# Patient Record
Sex: Female | Born: 1945 | Race: White | Hispanic: No | Marital: Married
Health system: Southern US, Community
[De-identification: ages and names within clinical notes are randomized; demographics above are authoritative.]

## PROBLEM LIST (undated history)

## (undated) DIAGNOSIS — I340 Nonrheumatic mitral (valve) insufficiency: Secondary | ICD-10-CM

## (undated) DIAGNOSIS — E785 Hyperlipidemia, unspecified: Secondary | ICD-10-CM

## (undated) DIAGNOSIS — Z8489 Family history of other specified conditions: Secondary | ICD-10-CM

## (undated) DIAGNOSIS — I499 Cardiac arrhythmia, unspecified: Secondary | ICD-10-CM

## (undated) DIAGNOSIS — K269 Duodenal ulcer, unspecified as acute or chronic, without hemorrhage or perforation: Secondary | ICD-10-CM

## (undated) DIAGNOSIS — M810 Age-related osteoporosis without current pathological fracture: Secondary | ICD-10-CM

## (undated) DIAGNOSIS — H9190 Unspecified hearing loss, unspecified ear: Secondary | ICD-10-CM

## (undated) DIAGNOSIS — F0390 Unspecified dementia without behavioral disturbance: Secondary | ICD-10-CM

## (undated) DIAGNOSIS — M1990 Unspecified osteoarthritis, unspecified site: Secondary | ICD-10-CM

## (undated) DIAGNOSIS — I10 Essential (primary) hypertension: Secondary | ICD-10-CM

## (undated) DIAGNOSIS — I517 Cardiomegaly: Secondary | ICD-10-CM

## (undated) DIAGNOSIS — R011 Cardiac murmur, unspecified: Secondary | ICD-10-CM

## (undated) DIAGNOSIS — R0602 Shortness of breath: Secondary | ICD-10-CM

## (undated) DIAGNOSIS — W19XXXA Unspecified fall, initial encounter: Secondary | ICD-10-CM

## (undated) DIAGNOSIS — E039 Hypothyroidism, unspecified: Secondary | ICD-10-CM

## (undated) DIAGNOSIS — J189 Pneumonia, unspecified organism: Secondary | ICD-10-CM

## (undated) HISTORY — PX: INNER EAR SURGERY: SHX679

## (undated) HISTORY — PX: CHOLECYSTECTOMY OPEN: SUR202

## (undated) HISTORY — DX: Cardiac arrhythmia, unspecified: I49.9

## (undated) HISTORY — PX: CATARACT EXTRACTION W/ INTRAOCULAR LENS  IMPLANT, BILATERAL: SHX1307

## (undated) HISTORY — PX: ABDOMINAL HYSTERECTOMY: SHX81

## (undated) HISTORY — PX: JOINT REPLACEMENT: SHX530

## (undated) HISTORY — PX: THYROIDECTOMY: SHX17

## (undated) HISTORY — DX: Age-related osteoporosis without current pathological fracture: M81.0

## (undated) HISTORY — PX: REPAIR OF PERFORATED ULCER: SHX6065

## (undated) HISTORY — DX: Essential (primary) hypertension: I10

## (undated) HISTORY — PX: TUBAL LIGATION: SHX77

## (undated) HISTORY — DX: Unspecified hearing loss, unspecified ear: H91.90

## (undated) HISTORY — DX: Hyperlipidemia, unspecified: E78.5

## (undated) HISTORY — PX: TONSILLECTOMY AND ADENOIDECTOMY: SUR1326

---
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
## 1999-04-14 ENCOUNTER — Encounter: Payer: Self-pay | Admitting: Internal Medicine

## 1999-04-14 ENCOUNTER — Ambulatory Visit (HOSPITAL_COMMUNITY): Admission: RE | Admit: 1999-04-14 | Discharge: 1999-04-14 | Payer: Self-pay | Admitting: Internal Medicine

## 1999-04-20 ENCOUNTER — Encounter: Payer: Self-pay | Admitting: Internal Medicine

## 1999-04-20 ENCOUNTER — Ambulatory Visit (HOSPITAL_COMMUNITY): Admission: RE | Admit: 1999-04-20 | Discharge: 1999-04-20 | Payer: Self-pay | Admitting: Internal Medicine

## 1999-04-26 ENCOUNTER — Encounter: Payer: Self-pay | Admitting: Neurology

## 1999-04-26 ENCOUNTER — Ambulatory Visit (HOSPITAL_COMMUNITY): Admission: RE | Admit: 1999-04-26 | Discharge: 1999-04-26 | Payer: Self-pay | Admitting: Neurology

## 1999-06-03 ENCOUNTER — Emergency Department (HOSPITAL_COMMUNITY): Admission: EM | Admit: 1999-06-03 | Discharge: 1999-06-03 | Payer: Self-pay | Admitting: Emergency Medicine

## 1999-06-29 ENCOUNTER — Other Ambulatory Visit: Admission: RE | Admit: 1999-06-29 | Discharge: 1999-06-29 | Payer: Self-pay | Admitting: Internal Medicine

## 1999-08-03 ENCOUNTER — Ambulatory Visit: Admission: RE | Admit: 1999-08-03 | Discharge: 1999-08-03 | Payer: Self-pay | Admitting: Pulmonary Disease

## 1999-09-24 ENCOUNTER — Other Ambulatory Visit: Admission: RE | Admit: 1999-09-24 | Discharge: 1999-09-24 | Payer: Self-pay | Admitting: *Deleted

## 2001-03-09 ENCOUNTER — Other Ambulatory Visit: Admission: RE | Admit: 2001-03-09 | Discharge: 2001-03-09 | Payer: Self-pay | Admitting: Obstetrics and Gynecology

## 2002-03-26 ENCOUNTER — Other Ambulatory Visit: Admission: RE | Admit: 2002-03-26 | Discharge: 2002-03-26 | Payer: Self-pay | Admitting: Obstetrics and Gynecology

## 2003-01-23 ENCOUNTER — Encounter: Admission: RE | Admit: 2003-01-23 | Discharge: 2003-01-23 | Payer: Self-pay | Admitting: Family Medicine

## 2003-01-23 ENCOUNTER — Encounter: Payer: Self-pay | Admitting: Family Medicine

## 2003-11-04 ENCOUNTER — Other Ambulatory Visit: Admission: RE | Admit: 2003-11-04 | Discharge: 2003-11-04 | Payer: Self-pay | Admitting: Obstetrics and Gynecology

## 2004-05-26 ENCOUNTER — Ambulatory Visit: Payer: Self-pay | Admitting: Pulmonary Disease

## 2004-05-31 ENCOUNTER — Ambulatory Visit: Payer: Self-pay | Admitting: Pulmonary Disease

## 2004-09-06 ENCOUNTER — Ambulatory Visit (HOSPITAL_COMMUNITY): Admission: RE | Admit: 2004-09-06 | Discharge: 2004-09-06 | Payer: Self-pay | Admitting: Gastroenterology

## 2004-09-06 ENCOUNTER — Encounter (INDEPENDENT_AMBULATORY_CARE_PROVIDER_SITE_OTHER): Payer: Self-pay | Admitting: Specialist

## 2005-02-04 ENCOUNTER — Ambulatory Visit: Payer: Self-pay | Admitting: Emergency Medicine

## 2005-03-29 ENCOUNTER — Other Ambulatory Visit: Admission: RE | Admit: 2005-03-29 | Discharge: 2005-03-29 | Payer: Self-pay | Admitting: Obstetrics and Gynecology

## 2005-06-14 ENCOUNTER — Emergency Department (HOSPITAL_COMMUNITY): Admission: EM | Admit: 2005-06-14 | Discharge: 2005-06-14 | Payer: Self-pay | Admitting: Family Medicine

## 2005-06-27 ENCOUNTER — Encounter: Admission: RE | Admit: 2005-06-27 | Discharge: 2005-06-27 | Payer: Self-pay | Admitting: Pediatrics

## 2005-10-11 ENCOUNTER — Emergency Department (HOSPITAL_COMMUNITY): Admission: EM | Admit: 2005-10-11 | Discharge: 2005-10-11 | Payer: Self-pay | Admitting: Emergency Medicine

## 2005-10-11 ENCOUNTER — Encounter: Payer: Self-pay | Admitting: Pulmonary Disease

## 2005-11-10 ENCOUNTER — Encounter: Admission: RE | Admit: 2005-11-10 | Discharge: 2005-11-10 | Payer: Self-pay | Admitting: Internal Medicine

## 2007-01-23 ENCOUNTER — Ambulatory Visit (HOSPITAL_BASED_OUTPATIENT_CLINIC_OR_DEPARTMENT_OTHER): Admission: RE | Admit: 2007-01-23 | Discharge: 2007-01-23 | Payer: Self-pay | Admitting: Orthopedic Surgery

## 2008-03-07 ENCOUNTER — Encounter: Admission: RE | Admit: 2008-03-07 | Discharge: 2008-03-07 | Payer: Self-pay | Admitting: Family Medicine

## 2008-05-09 ENCOUNTER — Ambulatory Visit: Payer: Self-pay | Admitting: Pulmonary Disease

## 2008-05-09 DIAGNOSIS — I10 Essential (primary) hypertension: Secondary | ICD-10-CM | POA: Insufficient documentation

## 2008-05-09 DIAGNOSIS — J309 Allergic rhinitis, unspecified: Secondary | ICD-10-CM | POA: Insufficient documentation

## 2008-05-09 DIAGNOSIS — J209 Acute bronchitis, unspecified: Secondary | ICD-10-CM

## 2008-05-09 DIAGNOSIS — J45909 Unspecified asthma, uncomplicated: Secondary | ICD-10-CM

## 2008-05-27 ENCOUNTER — Encounter: Admission: RE | Admit: 2008-05-27 | Discharge: 2008-05-27 | Payer: Self-pay | Admitting: Family Medicine

## 2008-06-10 ENCOUNTER — Ambulatory Visit: Payer: Self-pay | Admitting: Pulmonary Disease

## 2008-06-10 DIAGNOSIS — M818 Other osteoporosis without current pathological fracture: Secondary | ICD-10-CM

## 2008-07-14 ENCOUNTER — Ambulatory Visit: Payer: Self-pay | Admitting: Critical Care Medicine

## 2008-07-14 ENCOUNTER — Telehealth (INDEPENDENT_AMBULATORY_CARE_PROVIDER_SITE_OTHER): Payer: Self-pay | Admitting: *Deleted

## 2008-08-18 ENCOUNTER — Ambulatory Visit: Payer: Self-pay | Admitting: Internal Medicine

## 2008-10-23 ENCOUNTER — Ambulatory Visit: Payer: Self-pay | Admitting: Pulmonary Disease

## 2009-03-04 ENCOUNTER — Ambulatory Visit: Payer: Self-pay | Admitting: Pulmonary Disease

## 2009-03-06 DIAGNOSIS — B370 Candidal stomatitis: Secondary | ICD-10-CM

## 2009-03-20 ENCOUNTER — Telehealth (INDEPENDENT_AMBULATORY_CARE_PROVIDER_SITE_OTHER): Payer: Self-pay | Admitting: *Deleted

## 2009-05-07 ENCOUNTER — Encounter: Admission: RE | Admit: 2009-05-07 | Discharge: 2009-05-07 | Payer: Self-pay | Admitting: Family Medicine

## 2009-05-22 ENCOUNTER — Encounter: Admission: RE | Admit: 2009-05-22 | Discharge: 2009-05-22 | Payer: Self-pay | Admitting: Family Medicine

## 2009-05-22 DIAGNOSIS — I059 Rheumatic mitral valve disease, unspecified: Secondary | ICD-10-CM

## 2009-05-28 ENCOUNTER — Encounter: Admission: RE | Admit: 2009-05-28 | Discharge: 2009-05-28 | Payer: Self-pay | Admitting: Family Medicine

## 2009-08-17 DIAGNOSIS — H9190 Unspecified hearing loss, unspecified ear: Secondary | ICD-10-CM

## 2009-10-05 ENCOUNTER — Ambulatory Visit: Payer: Self-pay | Admitting: Pulmonary Disease

## 2010-04-21 DIAGNOSIS — F309 Manic episode, unspecified: Secondary | ICD-10-CM | POA: Insufficient documentation

## 2010-07-11 ENCOUNTER — Encounter: Payer: Self-pay | Admitting: Family Medicine

## 2010-07-22 NOTE — Assessment & Plan Note (Signed)
Summary: Acute NP office visit - asthma   Copy to:  self Primary Provider/Referring Provider:  Dr. Maryelizabeth Rowan  CC:  asthma flare with increased SOB, wheezing, and some coughing.  History of Present Illness: 65 year old  with  persistent asthma  She describes asthma since puberty, last prednisone use 5 -6 yrs ago, prednisone use in the past was complicated by duodenal ulcer perforation.  advair 500/50 has worked well, CXR 4/07 showed hyperinlation  05/09/08 >> Rxed for bronchitis with zpak . I do note the ACE inhibitor on her medication list - we will monitor her for upper airway stability. 12/09>>spirometry on advair 500/50 >> moderate reversible airway obstruction c/w inadequately tretaed asthma, singulair added 1/10 exacerbation requiring steroids 5/10 >> Rare use of rescue inhaler. Activity tolerance at baseline- does not exercise. Concerns about osteoporosis with high dose inhaled steroids, Occasional cough  March 06, 2009--Presents for 4 month follow up, states the decrease in her advair "isn't quite cutting it" and still feels SOB with exertion.  also c/o PND. Had been doing very well, until last 1-2 weeks, now more allergies, w/ nasal driange, throat tickle, and tightness. Denies chest pain,orthopnea, hemoptysis, fever, n/v/d, edema, headache.   October 05, 2009--Presents for an acute office visit. Complains of asthma flare with increased SOB, wheezing over last week. Worse over the last 2 days w/ bad weather w/ rain. Denies chest pain, dyspnea, orthopnea, hemoptysis, fever, n/v/d, headache. Under alot of stress lately. She is ACE but does not have cough (no psuedowheezing on exam). Using Advair w/ no missed doses. Has been doing well with asthma until recently x last 2 weeks. Does use ProAir mainly w/ exericses. Has used primetime mist when she was out of time. Denies chest pain,  orthopnea, hemoptysis, fever, n/v/d, edema, headache.   Medications Prior to Update: 1)   Levothroid 88 Mcg Tabs (Levothyroxine Sodium) .... Once Daily 2)  Amlodipine Besylate 5 Mg Tabs (Amlodipine Besylate) .... Once Daily 3)  Simvastatin 20 Mg Tabs (Simvastatin) .... At Bedtime 4)  Lisinopril-Hydrochlorothiazide 10-12.5 Mg Tabs (Lisinopril-Hydrochlorothiazide) .... Once Daily 5)  Travatan 0.004 % Soln (Travoprost) .... Once Daily 6)  [facility] Acetaminophen 325 Mg Tabs (Acetaminophen) .... As Needed 7)  Imodium Advanced 2-125 Mg Chew (Loperamide-Simethicone) .... Once Daily 8)  Proair Hfa 108 (90 Base) Mcg/act Aers (Albuterol Sulfate) .... 2 Puffs Every 4 Hours As Needed 9)  Boniva 150 Mg Tabs (Ibandronate Sodium) .[name].. 1 Per Month 10)  Vitamin D 16109 Unit Caps (Ergocalciferol) .... Once A Week 11)  Advair Diskus 500-50 Mcg/dose Aepb (Fluticasone-Salmeterol) .[name].. 1 Puff Two Times A Day Rinse Mouth Well 12)  Tylenol 325 Mg Tabs (Acetaminophen) .... Per Bottle 13)  Fluconazole 100 Mg Tabs (Fluconazole) .... 2 By Mouth Once Daily , 1 By Mouth Once Daily Till Gone  Current Medications (verified): 1)  Levothroid 88 Mcg Tabs (Levothyroxine Sodium) .... Once Daily 2)  Amlodipine Besylate 5 Mg Tabs (Amlodipine Besylate) .... Once Daily 3)  Simvastatin 20 Mg Tabs (Simvastatin) .... At Bedtime 4)  Lisinopril-Hydrochlorothiazide 10-12.5 Mg Tabs (Lisinopril-Hydrochlorothiazide) .... Once Daily 5)  Travatan 0.004 % Soln (Travoprost) .... Once Daily 6)  Imodium Advanced 2-125 Mg Chew (Loperamide-Simethicone) .... Once Daily 7)  Proair Hfa 108 (90 Base) Mcg/act Aers (Albuterol Sulfate) .... 2 Puffs Every 4 Hours As Needed 8)  Boniva 150 Mg Tabs (Ibandronate Sodium) .[name].. 1 Per Month 9)  Vitamin D 60454 Unit Caps (Ergocalciferol) .... Once A Week 10)  Advair Diskus 500-50 Mcg/dose Aepb (Fluticasone-Salmeterol) .Marland KitchenMarland KitchenMarland Kitchen  1 Puff Two Times A Day Rinse Mouth Well 11)  Tylenol 325 Mg Tabs (Acetaminophen) .... Per Bottle  Allergies: 1)  ! Aspirin 2)  ! 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  Family History: Last updated: 05/09/2008 allergies-twin sister  heart disease-mother blood clots-mother cancer-mother  Social History: Last updated: 05/09/2008 married and lives with husband daycare 2 dogs  Risk Factors: Smoking Status: never (05/09/2008)  Review of Systems      See HPI  Vital Signs:  Patient profile:   65 year old female Height:      61 inches Weight:      108.13 pounds BMI:     20.50 O2 Sat:      99 % on Room air Temp:     97.0 degrees F oral Pulse rate:   88 / minute BP sitting:   118 / 66  (left arm) Cuff size:   regular  Vitals Entered By: Boone Master CNA (October 05, 2009 11:07 AM)  O2 Flow:  Room air CC: asthma flare with increased SOB, wheezing, some coughing Is Patient Diabetic? No Comments Medications reviewed with patient Daytime contact number verified with patient. Boone Master CNA  October 05, 2009 11:07 AM    Physical Exam  Additional Exam:  Gen. Pleasant, well-nourished, in no distress ENT - no lesions, pale nasal mucosa, post pharynx w/ scattered white patches c/w thrush.  Neck: No JVD, no thyromegaly, no carotid bruits Lungs: no use of accessory muscles,  faint exp wheeizng, no psuedowheezing.  Cardiovascular: Rhythm regular, heart sounds  normal, no murmurs or gallops, no peripheral edema Musculoskeletal: No deformities, no cyanosis or clubbing      Impression & Recommendations:  Problem # 1:  ASTHMA (ICD-493.90) Flare  REC:  Steroid taper.  if she has recurrent flares would change her ACE -probalbly not a good idea with her asthma. Will send copy note to PCP Continue on Advair follow up Dr. Vassie Loll in  69month.   Medications Added to Medication  List This Visit: 1)  Prednisone 10 Mg Tabs (Prednisone) .... 4 tabs for 2 days, then 3 tabs for 2 days, 2 tabs for 2 days, then 1 tab for 2 days, then stop  Complete Medication List: 1)  Levothroid 88 Mcg Tabs (Levothyroxine sodium) .... Once daily 2)  Amlodipine Besylate 5 Mg Tabs (Amlodipine besylate) .... Once daily 3)  Simvastatin 20 Mg Tabs (Simvastatin) .... At bedtime 4)  Lisinopril-hydrochlorothiazide 10-12.5 Mg Tabs (Lisinopril-hydrochlorothiazide) .... Once daily 5)  Travatan 0.004 % Soln (Travoprost) .... Once daily 6)  Imodium Advanced 2-125 Mg Chew (Loperamide-simethicone) .... Once daily 7)  Proair Hfa 108 (90 Base) Mcg/act Aers (Albuterol sulfate) .... 2 puffs every 4 hours as needed 8)  Boniva 150 Mg Tabs (Ibandronate sodium) .[name].. 1 per month 9)  Vitamin D 45409 Unit Caps (Ergocalciferol) .... Once a week 10)  Advair Diskus 500-50 Mcg/dose Aepb (Fluticasone-salmeterol) .[name].. 1 puff two times a day rinse mouth well 11)  Tylenol 325 Mg Tabs (Acetaminophen) .... Per bottle 12)  Prednisone 10 Mg Tabs (Prednisone) .... 4 tabs for 2 days, then 3 tabs for 2 days, 2 tabs for 2 days, then 1 tab for 2 days, then stop  Other Orders: Est. Patient Level IV (81191)  Patient Instructions: 1)  Steroid taper over next week.  2)  Continue on Advair 1 puff two times a day , brush/rinse/gargle 3)  Stop Primatene Mist.  4)  May use ProAir for rescue use.  5)  Please contact office for sooner follow up if symptoms do not improve or worsen  6)  follow up Dr. Vassie Loll in 4 weeks.  Prescriptions: PREDNISONE 10 MG TABS (PREDNISONE) 4 tabs for 2 days, then 3 tabs for 2 days, 2 tabs for 2 days, then 1 tab for 2 days, then stop  #20 x 0   Entered and Authorized by:   Rubye Oaks NP   Signed by:   Rubye Oaks NP on 10/05/2009   Method used:   Electronically to        [facility]. [phone]* (retail)       [address]       [place], [place]  [address]       Ph:  [phone]       Fax: [phone]   RxID:   [id number]   Appended Document: neb tx documenation    Clinical Lists Changes  Orders: Added new Service order of Nebulizer Tx (44010) - Signed

## 2010-09-01 DIAGNOSIS — E559 Vitamin D deficiency, unspecified: Secondary | ICD-10-CM | POA: Insufficient documentation

## 2010-09-01 DIAGNOSIS — R7309 Other abnormal glucose: Secondary | ICD-10-CM | POA: Insufficient documentation

## 2010-09-01 DIAGNOSIS — M810 Age-related osteoporosis without current pathological fracture: Secondary | ICD-10-CM | POA: Insufficient documentation

## 2010-09-01 DIAGNOSIS — E785 Hyperlipidemia, unspecified: Secondary | ICD-10-CM | POA: Insufficient documentation

## 2010-09-01 DIAGNOSIS — I10 Essential (primary) hypertension: Secondary | ICD-10-CM | POA: Insufficient documentation

## 2010-10-28 DIAGNOSIS — K089 Disorder of teeth and supporting structures, unspecified: Secondary | ICD-10-CM | POA: Insufficient documentation

## 2010-11-02 NOTE — Op Note (Signed)
Cheryl [name], Cheryl [name]                ACCOUNT NO.:  [id number]   MEDICAL RECORD NO.:  [id number]          PATIENT TYPE:  AMB   LOCATION:  DSC                          FACILITY:  [facility]   PHYSICIAN:  Cheryl Fitch. [name], M.D. DATE OF BIRTH:  01-Oct-1945   DATE OF PROCEDURE:  01/23/2007  DATE OF DISCHARGE:                               OPERATIVE REPORT   PREOPERATIVE DIAGNOSES:  1. 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  2. Chronic stenosing tenosynovitis of left ring finger at A1 pulley      with flexion fractures of PIP and DIP joints.   POSTOPERATIVE DIAGNOSES:  1. 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  2. Chronic stenosing tenosynovitis of left ring finger at A1 pulley      with flexion fractures of PIP and DIP joints.   OPERATION:  1. Release of right long finger A1 pulley with limited tenosynovectomy      and removal of fibrotic tenosynovial fold.  2. Release of left ring finger A1 pulley with limited tenosynovectomy      and removal of fibrotic tenosynovium.   OPERATIONS:  Cheryl Igo, MD.   ASSISTANT:  Cheryl Rusk, PA-C.   ANESTHESIA:  General sedation/2% lidocaine metacarpal head level block  of right long finger and left ring finger.   SUPERVISING ANESTHESIOLOGIST:  Dr. Sampson Goon.   INDICATIONS:  Cheryl [name] is a 65 year old woman referred through the  courtesy of Dr. Garner Nash of [facility].   She has a history of bilateral stenosing tenosynovitis with significant  flexion contractures of the right long and left ring fingers.   She was seen in consultation and advised to proceed with release of the  A1 pulleys.   Her flexion contractures were due to marked fibrosis of the tenosynovium  proximal to the A1 pulleys.   After informed consent, she is brought to the operating room at  this  time.   DESCRIPTION OF PROCEDURE:  Tiaja Hagan is brought to the operating room  and placed in supine position on the operating table.   Following light sedation, the right and left arms were prepped with  Betadine soap solution and sterilely draped.  2% lidocaine was  infiltrated into the path of the intended incision for the right long A1  pulley release including the flexor sheath and the left ring finger, A1  pulley release including the flexor sheath.   The procedure commenced with exsanguination of the left arm with an  Esmarch bandage and inflation of arterial tourniquet on the proximal  brachium to 220 mmHg.  A short oblique incision was fashioned directly  over the left ring finger A1 pulley.  The subcutaneous tissue was  carefully divided taking care to identify and release the pretendinous  bands of the palmar fascia of the left ring finger.   The subcutaneous tissue was carefully divided taking care to identify  the A1 pulley.  There  was significant fibrosis of the tenosynovial fold  proximal to the A1 pulley.   The A1 pulley was released along its radial border.  The tendons were  delivered.  The superficialis and profundus tendons were adherent due to  the presence of marked fibrotic tenosynovium at the proximal fold.   This was meticulously dissected off with tenotomy scissors and a fine  rongeur.  Thereafter full range of motion of the left ring finger was  recovered with passive range of motion examination.   The wound was then repaired with multiple mattress sutures of 5-0 nylon.   The wound was dressed with Xeroflo sterile gauze and an Ace wrap.  The  tourniquet was released with immediate capillary refill to all fingers  and the thumb.   Attention then directed to the right long finger.   An oblique incision was fashioned in the distal palmar crease directly  over the palpably thickened A1 pulley.  The subcutaneous tissue was  carefully divided  taking care to identify and gently release the  pretendinous fibers of the palmar fascia.  The A1 pulley was isolated  and split along its radial border with scalpel and scissors.  The flexor  tendons were delivered and once again a fibrotic fold of tenosynovium  was dissected off the tendons proximal to the A1 pulley.  This allowed  recovery of full active range of motion as well as passive extension of  the right long finger.   The wound was infected at the bleeding points followed by repair of the  skin with mattress suture of 5-0 nylon.   The wound was then dressed with Xeroflo sterile gauze and an Ace wrap.  The tourniquet was released with immediate capillary refill.  There no  apparent complications.   Cheryl [name] tolerated the surgery and anesthesia well.  She is  transferred to the recovery room with stable vital signs.      Cheryl [name], M.D.  Electronically Signed     RVS/MEDQ  D:  01/23/2007  T:  01/23/2007  Job:  [id number]

## 2010-11-05 NOTE — Op Note (Signed)
NAME:  Cheryl [name], Cheryl [name]                ACCOUNT NO.:  [id number]   MEDICAL RECORD NO.:  [id number]          PATIENT TYPE:  AMB   LOCATION:  ENDO                         FACILITY:  [facility]   PHYSICIAN:  Bernette Redbird, M.D.   DATE OF BIRTH:  05-09-1946   DATE OF PROCEDURE:  09/06/2004  DATE OF DISCHARGE:                                 OPERATIVE REPORT   PROCEDURE:  Flexible sigmoidoscopy with biopsies.   ENDOSCOPIST:  Bernette Redbird, M.D.   INDICATIONS FOR PROCEDURE:  Screening for colon cancer in a 65 year old  female.   FINDINGS:  Two diminutive polyps, removed.   PROCEDURE:  The nature, purpose and risks of the procedure were familiar to  the patient who provided a written consent. Sedation for this procedure in  the upper endoscopy which preceded it totalled fentanyl 100 mcg and Versed 9  milligrams IV, without arrhythmias or desaturation. The Olympus pediatric  video colonoscope was advanced to about 40 cm, whereupon the prep became  suboptimal despite irrigation with water, so pullback was performed. There  was also some looping on attempts at further advancement.   Up to the limit of the exam, the quality of prep was very good, and it is  felt that those areas were well seen.   There were two small sessile polyps near the rectosigmoid junction at about  20 cm, removed by cold biopsy technique. One of them may have had some  slight residual tissue versus adjacent mucosal edema from the biopsy  procedure.   No large polyps were seen and there was no evidence of cancer, colitis,  vascular malformations or diverticulosis. Retroflexion was unremarkable. The  patient tolerated the procedure well and there no apparent complications.   IMPRESSION:  Small colon polyps, removed as described above.   PLAN:  Await pathology results.   Signed Robert B to the defects      RB/MEDQ  D:  09/06/2004  T:  09/06/2004  Job:  [id number]   cc:   Soyla Murphy. Renne Crigler, M.D.  [address]  [address]  [place]  [place] [address]  Fax: [phone]

## 2010-11-05 NOTE — Op Note (Signed)
NAME:  CALENE, PARADISO                ACCOUNT NO.:  [id number]   MEDICAL RECORD NO.:  [id number]          PATIENT TYPE:  AMB   LOCATION:  ENDO                         FACILITY:  [facility]   PHYSICIAN:  Bernette Redbird, M.D.   DATE OF BIRTH:  Jul 17, 1945   DATE OF PROCEDURE:  09/06/2004  DATE OF DISCHARGE:                                 OPERATIVE REPORT   PROCEDURE:  Upper endoscopy Savary dilatation of the esophagus under  fluoroscopy.   INDICATION:  Intermittent dysphagia symptoms in a 65 year old female.   FINDINGS:  Normal exam. Empiric dilatation to 18 mm.   PROCEDURE:  The nature, purpose, risks of the procedure have been discussed  with the patient who provided written consent. Sedation was fentanyl 100 mcg  and Versed 7 mg IV without arrhythmias or desaturation. The Olympus small-  caliber adult video endoscope was passed under direct vision. The vocal  cords looked normal and the esophagus was readily entered.   The esophagus was normal and specifically without evidence of stricture or  any ring or narrowing. No reflux esophagitis, Barrett's esophagus, varices,  infection or neoplasia were seen. The stomach contained a small bile-stained  residual, perhaps a little bit of retained food from some cereal which she  had the and this morning. This was suctioned up. The gastric mucosa was free  of any abnormalities such as gastritis, erosions, ulcers, polyps or masses  and a retroflexed view of the cardia was unremarkable. The pylorus, duodenal  bulb and second duodenum also looked normal.   It was decided to proceed with empiric dilatation based on the patient's  symptoms. The spring tip guidewire was passed into the antrum of the stomach  and the scope was removed in an exchange fashion, leaving the guidewire in  place, after which an 18 mm Savary dilator was passed under fluoroscopic  guidance, noting the wire to be in proper position. There was resistance  getting through the  hypopharyngeal region but then smooth resistance  remainder of way. The dilator and the guidewire were removed and the patient  was re-endoscoped under direct vision which showed a tiny hemorrhagic  abrasion of the soft palate without any obvious perforation. The esophagus  itself was free of any evident disruption, for example any fracture of her  ring or any other abnormality and the stomach was  without evidence of undue trauma. The scope was removed from the patient.  She tolerated the procedure well and there no apparent complications.   PLAN:  Clinical follow-up of dysphagia symptoms.      RB/MEDQ  D:  09/06/2004  T:  09/06/2004  Job:  [id number]   cc:   Soyla Murphy. Renne Crigler, M.D.  [address]  [place]  [place] [address]  Fax: [phone]

## 2011-02-06 ENCOUNTER — Emergency Department (HOSPITAL_COMMUNITY)
Admission: EM | Admit: 2011-02-06 | Discharge: 2011-02-07 | Disposition: A | Discharge status: Home / Self Care | Payer: BC Managed Care – PPO | Attending: Emergency Medicine | Admitting: Emergency Medicine

## 2011-02-06 ENCOUNTER — Emergency Department (HOSPITAL_COMMUNITY): Payer: BC Managed Care – PPO

## 2011-02-06 DIAGNOSIS — J449 Chronic obstructive pulmonary disease, unspecified: Secondary | ICD-10-CM | POA: Insufficient documentation

## 2011-02-06 DIAGNOSIS — E039 Hypothyroidism, unspecified: Secondary | ICD-10-CM | POA: Insufficient documentation

## 2011-02-06 DIAGNOSIS — R0602 Shortness of breath: Secondary | ICD-10-CM | POA: Insufficient documentation

## 2011-02-06 DIAGNOSIS — I059 Rheumatic mitral valve disease, unspecified: Secondary | ICD-10-CM | POA: Insufficient documentation

## 2011-02-06 DIAGNOSIS — R059 Cough, unspecified: Secondary | ICD-10-CM | POA: Insufficient documentation

## 2011-02-06 DIAGNOSIS — R05 Cough: Secondary | ICD-10-CM | POA: Insufficient documentation

## 2011-02-06 DIAGNOSIS — I10 Essential (primary) hypertension: Secondary | ICD-10-CM | POA: Insufficient documentation

## 2011-02-06 DIAGNOSIS — J4489 Other specified chronic obstructive pulmonary disease: Secondary | ICD-10-CM | POA: Insufficient documentation

## 2011-02-06 LAB — CBC
Hemoglobin: 13 g/dL (ref 12.0–15.0)
MCHC: 32.7 g/dL (ref 30.0–36.0)
Platelets: 328 10*3/uL (ref 150–400)
RBC: 4.35 MIL/uL (ref 3.87–5.11)
RDW: 13.4 % (ref 11.5–15.5)
WBC: 10.2 10*3/uL (ref 4.0–10.5)

## 2011-02-06 LAB — COMPREHENSIVE METABOLIC PANEL
AST: 26 U/L (ref 0–37)
Alkaline Phosphatase: 117 U/L (ref 39–117)
BUN: 16 mg/dL (ref 6–23)
Chloride: 104 mEq/L (ref 96–112)
GFR calc Af Amer: >60 mL/min (ref >60)
GFR calc non Af Amer: >60 mL/min (ref >60)
Glucose, Bld: 117 mg/dL — ABNORMAL HIGH (ref 70–99)
Sodium: 139 mEq/L (ref 135–145)

## 2011-02-06 LAB — DIFFERENTIAL
Basophils Absolute: 0 10*3/uL (ref 0.0–0.1)
Eosinophils Absolute: 0.8 10*3/uL — ABNORMAL HIGH (ref 0.0–0.7)
Eosinophils Relative: 8 % — ABNORMAL HIGH (ref 0–5)
Lymphocytes Relative: 33 % (ref 12–46)
Neutro Abs: 5.3 10*3/uL (ref 1.7–7.7)

## 2011-02-06 LAB — PRO B NATRIURETIC PEPTIDE: Pro B Natriuretic peptide (BNP): 88.6 pg/mL (ref 0–125)

## 2011-03-04 ENCOUNTER — Other Ambulatory Visit: Payer: Self-pay | Admitting: Family Medicine

## 2011-03-04 DIAGNOSIS — Z1231 Encounter for screening mammogram for malignant neoplasm of breast: Secondary | ICD-10-CM

## 2011-03-04 DIAGNOSIS — M25551 Pain in right hip: Secondary | ICD-10-CM | POA: Insufficient documentation

## 2011-03-04 DIAGNOSIS — Q72899 Other reduction defects of unspecified lower limb: Secondary | ICD-10-CM | POA: Insufficient documentation

## 2011-03-04 DIAGNOSIS — I10 Essential (primary) hypertension: Secondary | ICD-10-CM | POA: Insufficient documentation

## 2011-03-04 DIAGNOSIS — I272 Pulmonary hypertension, unspecified: Secondary | ICD-10-CM | POA: Insufficient documentation

## 2011-03-04 DIAGNOSIS — E039 Hypothyroidism, unspecified: Secondary | ICD-10-CM | POA: Insufficient documentation

## 2011-03-04 DIAGNOSIS — I517 Cardiomegaly: Secondary | ICD-10-CM | POA: Insufficient documentation

## 2011-03-04 DIAGNOSIS — I341 Nonrheumatic mitral (valve) prolapse: Secondary | ICD-10-CM | POA: Insufficient documentation

## 2011-03-10 ENCOUNTER — Ambulatory Visit
Admission: RE | Admit: 2011-03-10 | Discharge: 2011-03-10 | Disposition: A | Discharge status: Home / Self Care | Payer: BC Managed Care – PPO | Source: Ambulatory Visit | Attending: Family Medicine | Admitting: Family Medicine

## 2011-03-10 DIAGNOSIS — Z1231 Encounter for screening mammogram for malignant neoplasm of breast: Secondary | ICD-10-CM

## 2011-03-10 DIAGNOSIS — R945 Abnormal results of liver function studies: Secondary | ICD-10-CM | POA: Insufficient documentation

## 2011-04-04 LAB — BASIC METABOLIC PANEL
GFR calc Af Amer: >60
GFR calc non Af Amer: >60
Glucose, Bld: 97

## 2011-08-19 ENCOUNTER — Telehealth: Payer: Self-pay | Admitting: Pulmonary Disease

## 2011-08-19 NOTE — Telephone Encounter (Signed)
Spoke with pt. She states would like appt with RA to "discuss some issues" regarding hospitalizations since her last visit with him in 2011. She states only wants to see RA. I advised that he has no openings until 09/30/11. She states would like to be seen at least this month, preferably after the 8th when she gets paid. RA is here on 08/26/11- is it okay to overbook??  Pt states that she is almost out of advair 5/500 and is requesting a sample of this, although she was questioning whether needs to stay on this or not, pt sounds very confused about everything.  Please advise, thanks! Allergies  Allergen Reactions  . Aspirin     REACTION: asthma flare  . Codeine     REACTION: ulcer

## 2011-08-26 NOTE — Telephone Encounter (Signed)
Called and spoke with pt and she is aware that she will need to see RA for med changes.  Pt voiced her understanding and wanted to come in on 3/25 at 3:30.  Will schedule this appt for the pt.

## 2011-08-26 NOTE — Telephone Encounter (Signed)
Ok to make OV- next available

## 2011-08-30 NOTE — Telephone Encounter (Signed)
Scheduled pt to see RA on 3/25 at 3:30 pt aware of appt--per Randell Loop

## 2011-09-09 ENCOUNTER — Encounter: Payer: Self-pay | Admitting: Pulmonary Disease

## 2011-09-12 ENCOUNTER — Ambulatory Visit (INDEPENDENT_AMBULATORY_CARE_PROVIDER_SITE_OTHER): Payer: BC Managed Care – PPO | Admitting: Pulmonary Disease

## 2011-09-12 ENCOUNTER — Encounter: Payer: Self-pay | Admitting: Pulmonary Disease

## 2011-09-12 VITALS — BP 150/72 | HR 55 | Temp 97.9°F | Ht 60.0 in | Wt 118.2 lb

## 2011-09-12 DIAGNOSIS — J45909 Unspecified asthma, uncomplicated: Secondary | ICD-10-CM

## 2011-09-12 MED ORDER — FLUTICASONE-SALMETEROL 250-50 MCG/DOSE IN AEPB: 1.0000 | INHALATION_SPRAY | Freq: Two times a day (BID) | RESPIRATORY_TRACT | Status: DC

## 2011-09-12 MED ORDER — ALBUTEROL SULFATE HFA 108 (90 BASE) MCG/ACT IN AERS: 2.0000 | INHALATION_SPRAY | Freq: Four times a day (QID) | RESPIRATORY_TRACT | Status: DC | PRN

## 2011-09-12 NOTE — Patient Instructions (Addendum)
OK to Decrease advair to 250/50 1 puff twice daily Rx for albuterol MDI 2 puffs q 6h as needed

## 2011-09-12 NOTE — Assessment & Plan Note (Signed)
Ok to decrease advair to 250/50 Will need post -BD testing to see if reversibility exists - she seems to be relatively asymptomatic Will provide her with rescue saba - she knows to call back if increased use. We discussed signs of a flare.

## 2011-09-12 NOTE — Progress Notes (Signed)
  Subjective:    Patient ID: GUENEVERE ROORDA, female    DOB: 07/28/1945, 66 y.o.   MRN: [id number]  HPI  Primary Provider : [facility] Spr[facility], Deatra James  66 year old with persistent asthma  She describes asthma since puberty, last prednisone use 5 -6 yrs ago, prednisone use in the past was complicated by duodenal ulcer perforation.  advair 500/50 has worked well, CXR 4/07 showed hyperinlation  05/09/08 >> Rxed for bronchitis with zpak .   12/09>>spirometry on advair 500/50 >> moderate reversible airway obstruction c/w inadequately tretaed asthma, singulair added  1/10 exacerbation requiring steroids   October 05, 2009--flare needing steroids  09/12/2011 FU after 2 yrs, changed PCP 2 ER visits , not needed prednisone  Rare use of rescue inhaler. Activity tolerance at baseline- does not exercise. Concerns about osteoporosis with high dose inhaled steroids, Occasional cough  Spirometry >> mod severe airway obstruction - fev1 51%  Denies chest pain, orthopnea, hemoptysis, fever, n/v/d, edema, headache.    Review of Systems Patient denies significant dyspnea,cough, hemoptysis,  chest pain, palpitations, pedal edema, orthopnea, paroxysmal nocturnal dyspnea, lightheadedness, nausea, vomiting, abdominal or  leg pains      Objective:   Physical Exam  Gen. Pleasant, thin woman, in no distress ENT - no lesions, no post nasal drip Neck: No JVD, no thyromegaly, no carotid bruits Lungs: no use of accessory muscles, no dullness to percussion, clear without rales or rhonchi  Cardiovascular: Rhythm regular, heart sounds  normal, no murmurs or gallops, no peripheral edema Musculoskeletal: No deformities, no cyanosis or clubbing        Assessment & Plan:

## 2011-10-26 ENCOUNTER — Encounter: Payer: Self-pay | Admitting: Adult Health

## 2011-10-26 ENCOUNTER — Ambulatory Visit (INDEPENDENT_AMBULATORY_CARE_PROVIDER_SITE_OTHER): Payer: BC Managed Care – PPO | Admitting: Adult Health

## 2011-10-26 VITALS — BP 160/70 | HR 99 | Temp 97.0°F | Ht 60.6 in | Wt 118.0 lb

## 2011-10-26 DIAGNOSIS — I10 Essential (primary) hypertension: Secondary | ICD-10-CM

## 2011-10-26 DIAGNOSIS — J45909 Unspecified asthma, uncomplicated: Secondary | ICD-10-CM

## 2011-10-26 NOTE — Assessment & Plan Note (Signed)
Poor control with ? AR trigger +/- Ace Inhibitor   Plan;  Increase Advair 500/50 1 puff Twice daily  Until sample is gone - brush/rinse and gargle after use.  Use Claritin 10 mg daily for one week, then as needed for drainage. Saline nasal rinses as needed. Discussed with primary care doctor regarding blood pressure and medication that may be causing your asthma to be worse. A medication of concern is a Lotrel, which has an ACE inhibitor - we will forward today's office note to your family doctor Followup with Dr. Vassie Loll, and 3 months and as needed

## 2011-10-26 NOTE — Progress Notes (Signed)
  Subjective:    Patient ID: Cheryl [name], female    DOB: 1945-11-01, 66 y.o.   MRN: [id number]  HPI Primary Provider : [facility], Deatra James  66 year old with persistent asthma -Never smoker.  She describes asthma since puberty, last prednisone use 5 -6 yrs ago, prednisone use in the past was complicated by duodenal ulcer perforation.  advair 500/50 has worked well, CXR 4/07 showed hyperinlation  05/09/08 >> Rxed for bronchitis with zpak .   12/09>>spirometry on advair 500/50 >> moderate reversible airway obstruction c/w inadequately tretaed asthma, singulair added  1/10 exacerbation requiring steroids   October 05, 2009--flare needing steroids  09/12/2011 FU after 2 yrs, changed PCP 2 ER visits , not needed prednisone  Rare use of rescue inhaler. Activity tolerance at baseline- does not exercise. Concerns about osteoporosis with high dose inhaled steroids, Occasional cough  Spirometry >> mod severe airway obstruction - fev1 51%  Denies chest pain, orthopnea, hemoptysis, fever, n/v/d, edema, headache.  >>decreased Advair 250  10/26/2011 Acute OV  Complains sore throat, increased SOB, hoarsenes for last 1-2 weeks.  Did well on Advair 250 until 1 week. Increased SABA use for last 1 week.  Of note she is on an ACE inhibitor.  Under some stress, husband has bladder cancer , mother just passed.  No sinus drainage. No overt reflux.  Blood pressure is elevated, ran out of b/p meds x 2 days      Review of Systems Constitutional:   No  weight loss, night sweats,  Fevers, chills,  +fatigue, or  lassitude.  HEENT:   No headaches,  Difficulty swallowing,  Tooth/dental problems, or  Sore throat,                No sneezing, itching, ear ache,  +nasal congestion, post nasal drip,   CV:  No chest pain,  Orthopnea, PND, swelling in lower extremities, anasarca, dizziness, palpitations, syncope.   GI  No heartburn, indigestion, abdominal pain, nausea, vomiting, diarrhea, change in bowel  habits, loss of appetite, bloody stools.   Resp:  No coughing up of blood. [name]  No chest wall deformity  Skin: no rash or lesions.  GU: no dysuria, change in color of urine, no urgency or frequency.  No flank pain, no hematuria   MS:  No joint pain or swelling.  No decreased range of motion.  No back pain.  Psych:  No change in mood or affect. No depression or anxiety.  No memory loss.         Objective:   Physical Exam  GEN: A/Ox3; pleasant , NAD, thin, anxious   HEENT:  [place]/AT,  EACs-clear, TMs-wnl, NOSE-clear, THROAT-clear, no lesions, no postnasal drip or exudate noted. , hoarse w/ dry cough   NECK:  Supple w/ fair ROM; no JVD; normal carotid impulses w/o bruits; no thyromegaly or nodules palpated; no lymphadenopathy.  RESP  Coarse BS w/  w/o, wheezes/ rales/ or rhonchi.no accessory muscle use, no dullness to percussion  CARD:  RRR, no m/r/g  , no peripheral edema, pulses intact, no cyanosis or clubbing.  GI:   Soft & nt; nml bowel sounds; no organomegaly or masses detected.  Musco: Warm bil, no deformities or joint swelling noted.   Neuro: alert, no focal deficits noted.    Skin: Warm, no lesions or rashes        Assessment & Plan:

## 2011-10-26 NOTE — Assessment & Plan Note (Signed)
Elevated today , advised to discuss with PCP

## 2011-10-26 NOTE — Patient Instructions (Signed)
Increase Advair 500/50 1 puff Twice daily  Until sample is gone - brush/rinse and gargle after use.  Use Claritin 10 mg daily for one week, then as needed for drainage. Saline nasal rinses as needed. Discussed with primary care doctor regarding blood pressure and medication that may be causing your asthma to be worse. A medication of concern is a Lotrel, which has an ACE inhibitor - we will forward today's office note to your family doctor Followup with Dr. Vassie Loll, and 3 months and as needed

## 2012-01-12 ENCOUNTER — Ambulatory Visit: Payer: BC Managed Care – PPO | Admitting: Adult Health

## 2012-01-20 ENCOUNTER — Telehealth: Payer: Self-pay | Admitting: Pulmonary Disease

## 2012-01-20 MED ORDER — ALBUTEROL SULFATE HFA 108 (90 BASE) MCG/ACT IN AERS: 2.0000 | INHALATION_SPRAY | Freq: Four times a day (QID) | RESPIRATORY_TRACT | Status: DC | PRN

## 2012-01-20 NOTE — Telephone Encounter (Signed)
i spoke with pt and is aware rx has been sent. She states she will make an appt once her daughter has her baby. Nothing further was needed

## 2012-05-11 ENCOUNTER — Ambulatory Visit (INDEPENDENT_AMBULATORY_CARE_PROVIDER_SITE_OTHER): Payer: BC Managed Care – PPO | Admitting: Family Medicine

## 2012-05-11 ENCOUNTER — Ambulatory Visit (INDEPENDENT_AMBULATORY_CARE_PROVIDER_SITE_OTHER): Payer: BC Managed Care – PPO

## 2012-05-11 VITALS — BP 138/80 | HR 86 | Temp 97.8°F | Resp 23 | Ht 60.6 in | Wt 105.0 lb

## 2012-05-11 DIAGNOSIS — J018 Other acute sinusitis: Secondary | ICD-10-CM

## 2012-05-11 DIAGNOSIS — R05 Cough: Secondary | ICD-10-CM

## 2012-05-11 DIAGNOSIS — R6889 Other general symptoms and signs: Secondary | ICD-10-CM

## 2012-05-11 DIAGNOSIS — R634 Abnormal weight loss: Secondary | ICD-10-CM

## 2012-05-11 DIAGNOSIS — R059 Cough, unspecified: Secondary | ICD-10-CM

## 2012-05-11 DIAGNOSIS — Z23 Encounter for immunization: Secondary | ICD-10-CM

## 2012-05-11 DIAGNOSIS — J449 Chronic obstructive pulmonary disease, unspecified: Secondary | ICD-10-CM

## 2012-05-11 DIAGNOSIS — J029 Acute pharyngitis, unspecified: Secondary | ICD-10-CM

## 2012-05-11 DIAGNOSIS — R0600 Dyspnea, unspecified: Secondary | ICD-10-CM

## 2012-05-11 DIAGNOSIS — R0609 Other forms of dyspnea: Secondary | ICD-10-CM

## 2012-05-11 DIAGNOSIS — R350 Frequency of micturition: Secondary | ICD-10-CM

## 2012-05-11 LAB — POCT CBC
Granulocyte percent: 62 %G (ref 37–80)
MCH, POC: 29.9 pg (ref 27–31.2)
MID (cbc): 0.8 (ref 0–0.9)
MPV: 7.9 fL (ref 0–99.8)
POC Granulocyte: 6.2 (ref 2–6.9)
POC MID %: 8.2 %M (ref 0–12)
Platelet Count, POC: 407 10*3/uL (ref 142–424)
RBC: 5.09 M/uL (ref 4.04–5.48)
WBC: 10 10*3/uL (ref 4.6–10.2)

## 2012-05-11 LAB — POCT UA - MICROSCOPIC ONLY
Casts, Ur, LPF, POC: NEGATIVE
Mucus, UA: NEGATIVE
Yeast, UA: NEGATIVE

## 2012-05-11 LAB — GLUCOSE, POCT (MANUAL RESULT ENTRY): POC Glucose: 101 mg/dl — AB (ref 70–99)

## 2012-05-11 LAB — POCT URINALYSIS DIPSTICK
Ketones, UA: NEGATIVE
Protein, UA: NEGATIVE
Urobilinogen, UA: 0.2
pH, UA: 5

## 2012-05-11 MED ORDER — ALBUTEROL SULFATE HFA 108 (90 BASE) MCG/ACT IN AERS: 2.0000 | INHALATION_SPRAY | Freq: Four times a day (QID) | RESPIRATORY_TRACT | Status: DC | PRN

## 2012-05-11 MED ORDER — FLUTICASONE-SALMETEROL 500-50 MCG/DOSE IN AEPB: 1.0000 | INHALATION_SPRAY | Freq: Two times a day (BID) | RESPIRATORY_TRACT | Status: DC

## 2012-05-11 NOTE — Progress Notes (Signed)
Subjective: 66 year old lady who is here with a number of them concerns. She has been having unexplained weight loss. She's not been trying to lose weight. Her weight has gone down from about 118 in the spring here to 105 now. She has not had any major GI complaints. She has had cold intolerance, just can't seem to get warm. She has been having a intermittent sore throat for about a month. She has had urinary frequency. No dysuria. She is had a chronic cough. She does have a history of COPD. She's been told she was borderline diabetic, but her sugars run well to her knowledge. Her bowels are okay. When she lifts her grandchild she gets short of breath. She has deafness wears hearing aids, and has a history of a partial cleft palate.  Ros: Heent.no acute changes sore throat. Resp: as above Cv.L shoulder occ pain. Gi: hx of steroids causing perf ulcer Ms weakness  Objective: Pleasant, alert, fully oriented lady in no does does intermittently have a hacking cough. Wears hearing aids on both sides. The nasal ala or a little asymmetrical from her cleft problems. Her throat has some headache mucus stuck in a fine layer on the back of the throat is a little spots. Neck was supple without significant nodes. Chest is clear to auscultation. Heart regular without murmurs gallops or arrhythmias. Abdomen soft without mass or tenderness. No CVA tenderness.  Smallnevus right forearm  Assessment: Weight loss Chronic cough History of COPD Urinary frequency Sore throat Cold intolerance History of partial cleft palate Loss of hearing  Plan: Chest x-ray Urinalysis, CBC, complete metabolic panel, glucose, hemoglobin A1c, and TSH  Plan to look at the partial results which are available today for [place] to do with her. .  Results for orders placed in visit on 05/11/12  POCT CBC      Component Value Range   WBC 10.0  4.6 - 10.2 K/uL   Lymph, poc 3.0  0.6 - 3.4   POC LYMPH PERCENT 29.8  10 - 50 %L   MID (cbc)  0.8  0 - 0.9   POC MID % 8.2  0 - 12 %M   POC Granulocyte 6.2  2 - 6.9   Granulocyte percent 62.0  37 - 80 %G   RBC 5.09  4.04 - 5.48 M/uL   Hemoglobin 15.2  12.2 - 16.2 g/dL   HCT, POC 29.5 (*) 62.1 - 47.9 %   MCV 95.6  80 - 97 fL   MCH, POC 29.9  27 - 31.2 pg   MCHC 31.2 (*) 31.8 - 35.4 g/dL   RDW, POC 30.8     Platelet Count, POC 407  142 - 424 K/uL   MPV 7.9  0 - 99.8 fL  GLUCOSE, POCT (MANUAL RESULT ENTRY)      Component Value Range   POC Glucose 101 (*) 70 - 99 mg/dl  POCT GLYCOSYLATED HEMOGLOBIN (HGB A1C)      Component Value Range   Hemoglobin A1C 5.7    POCT URINALYSIS DIPSTICK      Component Value Range   Color, UA yellow     Clarity, UA clear     Glucose, UA neg     Bilirubin, UA neg     Ketones, UA neg     Spec Grav, UA 1.020     Blood, UA neg     pH, UA 5.0     Protein, UA neg     Urobilinogen, UA 0.2  Nitrite, UA neg     Leukocytes, UA small (1+)    POCT UA - MICROSCOPIC ONLY      Component Value Range   WBC, Ur, HPF, POC 0-3     RBC, urine, microscopic neg     Bacteria, U Microscopic neg     Mucus, UA neg     Epithelial cells, urine per micros 0-1     Crystals, Ur, HPF, POC neg     Casts, Ur, LPF, POC neg     Yeast, UA neg     [facility] reading (PRIMARY) by  Dr. Alwyn Ren Emphysema.  No pulmonary lesions or infiltrate  Symptoms of undetermined etiology. Await additional labs.

## 2012-05-11 NOTE — Patient Instructions (Addendum)
Consider seeing the pulmonary doctor if your symptoms persist I will let you know the results of your labs in a few days Work hard at eating extra calories

## 2012-05-12 LAB — COMPREHENSIVE METABOLIC PANEL
Albumin: 4.4 g/dL (ref 3.5–5.2)
Alkaline Phosphatase: 117 U/L (ref 39–117)
Glucose, Bld: 112 mg/dL — ABNORMAL HIGH (ref 70–99)
Potassium: 4.3 mEq/L (ref 3.5–5.3)
Sodium: 140 mEq/L (ref 135–145)
Total Protein: 7.5 g/dL (ref 6.0–8.3)

## 2012-05-13 ENCOUNTER — Encounter: Payer: Self-pay | Admitting: Family Medicine

## 2012-09-06 ENCOUNTER — Other Ambulatory Visit: Payer: Self-pay | Admitting: Family Medicine

## 2012-10-28 ENCOUNTER — Other Ambulatory Visit: Payer: Self-pay | Admitting: Family Medicine

## 2012-12-03 ENCOUNTER — Other Ambulatory Visit: Payer: Self-pay | Admitting: Family Medicine

## 2012-12-04 ENCOUNTER — Telehealth: Payer: Self-pay | Admitting: Psychology

## 2012-12-04 DIAGNOSIS — R0600 Dyspnea, unspecified: Secondary | ICD-10-CM

## 2012-12-04 DIAGNOSIS — J449 Chronic obstructive pulmonary disease, unspecified: Secondary | ICD-10-CM

## 2012-12-04 MED ORDER — ALBUTEROL SULFATE HFA 108 (90 BASE) MCG/ACT IN AERS: INHALATION_SPRAY | RESPIRATORY_TRACT | Status: DC

## 2012-12-04 MED ORDER — FLUTICASONE-SALMETEROL 500-50 MCG/DOSE IN AEPB: 1.0000 | INHALATION_SPRAY | Freq: Two times a day (BID) | RESPIRATORY_TRACT | Status: DC

## 2012-12-04 NOTE — Telephone Encounter (Signed)
Spoke with pt aware 30 day supply will be called in an she needs to make OV before meds run out. Pt verbally understood stated she would call back to make appt.  rx's sent

## 2012-12-04 NOTE — Telephone Encounter (Signed)
Patient is calling back about refills for advair and proair.

## 2013-01-04 ENCOUNTER — Ambulatory Visit (INDEPENDENT_AMBULATORY_CARE_PROVIDER_SITE_OTHER): Payer: BC Managed Care – PPO | Admitting: Pulmonary Disease

## 2013-01-04 ENCOUNTER — Encounter: Payer: Self-pay | Admitting: Pulmonary Disease

## 2013-01-04 VITALS — BP 146/80 | HR 67 | Temp 97.9°F | Ht 60.5 in | Wt 108.2 lb

## 2013-01-04 DIAGNOSIS — R0989 Other specified symptoms and signs involving the circulatory and respiratory systems: Secondary | ICD-10-CM

## 2013-01-04 DIAGNOSIS — R0600 Dyspnea, unspecified: Secondary | ICD-10-CM

## 2013-01-04 DIAGNOSIS — J45909 Unspecified asthma, uncomplicated: Secondary | ICD-10-CM

## 2013-01-04 DIAGNOSIS — J449 Chronic obstructive pulmonary disease, unspecified: Secondary | ICD-10-CM

## 2013-01-04 MED ORDER — ALBUTEROL SULFATE HFA 108 (90 BASE) MCG/ACT IN AERS: INHALATION_SPRAY | RESPIRATORY_TRACT | Status: DC

## 2013-01-04 MED ORDER — FLUTICASONE-SALMETEROL 500-50 MCG/DOSE IN AEPB: 1.0000 | INHALATION_SPRAY | Freq: Two times a day (BID) | RESPIRATORY_TRACT | Status: DC

## 2013-01-04 NOTE — Assessment & Plan Note (Signed)
I have reviewed students note and agree with assessment, and exam.  She is stable on current inhaler regimen.  Will send refills.  Explained importance of maintaining follow up with her doctors.

## 2013-01-04 NOTE — Patient Instructions (Signed)
Follow up with Dr. Vassie Loll in 6 months

## 2013-01-04 NOTE — Progress Notes (Signed)
Chief Complaint  Patient presents with  . Follow-up    pt of Dr. Vassie Loll.  pt stated that she needs refills of her albuterol and advair    History of Present Illness: Cheryl [name] is a 67 y.o. female with history of COPD/asthma, presenting for albuterol and Advair refills. She currently has her symptoms well managed on these medications and denies cough, phlegm, chest tightness, and wheezing. She has monthly awakenings at night only when she has not taken her Advair, and uses her albuterol before exertion to prevent breathing difficulty.  She has no associated leg swelling, chest pain or SOB.  She has had no recent illness, hospitalizations or flares. She currently does not have a PCP.    TESTS:   JOENE GELDER  has a past medical history of Allergic rhinitis; Asthma; Emphysema; Hypertension; Abnormal heart rhythm; and Partial deafness.  CONSANDRA LASKE  has past surgical history that includes Cholecystectomy; Back surgery; Nasal sinus surgery; Knee surgery; Tubal ligation; ear operations; and Cleft palate repair.  Prior to Admission medications   Medication Sig Start Date End Date Taking? Authorizing Provider  acetaminophen (TYLENOL) 325 MG tablet Take 650 mg by mouth every 6 (six) hours as needed.   Yes Historical Provider, MD  albuterol (PROAIR HFA) 108 (90 BASE) MCG/ACT inhaler INHALE 2 PUFFS INTO THE LUNGS EVERY 6 HOURS AS NEEDED FOR WHEEZING 12/04/12  Yes Oretha Milch, MD  amLODipine-benazepril (LOTREL) 5-20 MG per capsule Take 1 capsule by mouth daily.  08/01/11  Yes Historical Provider, MD  Brimonidine Tartrate (ALPHAGAN P OP) Apply to eye. Patient unsure of dosage   Yes Historical Provider, MD  Fluticasone-Salmeterol (ADVAIR DISKUS) 500-50 MCG/DOSE AEPB Inhale 1 puff into the lungs 2 (two) times daily. 12/04/12  Yes Oretha Milch, MD  hydrochlorothiazide (HYDRODIURIL) 12.5 MG tablet Take 12.5 mg by mouth Daily.  08/20/11  Yes Historical Provider, MD  levothyroxine (SYNTHROID, LEVOTHROID)  88 MCG tablet Take 88 mcg by mouth daily.   Yes Historical Provider, MD  Loperamide-Simethicone (IMODIUM ADVANCED) 2-125 MG CHEW Chew by mouth as needed.    Yes Historical Provider, MD  Vitamin D, Ergocalciferol, (DRISDOL) 50000 UNITS CAPS Take 50,000 Units by mouth every 7 (seven) days.   Yes Historical Provider, MD    Allergies  Allergen Reactions  . Aspirin     REACTION: asthma flare  . Codeine     REACTION: ulcer     Physical Exam:  General - No distress ENT - No sinus tenderness, some oral exudate noted on soft palate, no LAN Cardiac - s1s2 regular, no murmur Chest - No wheeze/rales/dullness. Diminished breath sounds b/l. Back - No focal tenderness Abd - Soft, non-tender Ext - No edema Neuro - Normal strength Skin - No rashes Psych - normal mood, and behavior   Assessment/Plan:  Coralyn Helling, MD [facility] Pager:  [phone]

## 2013-01-22 ENCOUNTER — Ambulatory Visit (INDEPENDENT_AMBULATORY_CARE_PROVIDER_SITE_OTHER): Payer: BC Managed Care – PPO | Admitting: Family Medicine

## 2013-01-22 ENCOUNTER — Encounter (HOSPITAL_COMMUNITY): Payer: Self-pay

## 2013-01-22 ENCOUNTER — Emergency Department (HOSPITAL_COMMUNITY)
Admission: EM | Admit: 2013-01-22 | Discharge: 2013-01-22 | Disposition: A | Discharge status: Home / Self Care | Payer: BC Managed Care – PPO | Attending: Emergency Medicine | Admitting: Emergency Medicine

## 2013-01-22 ENCOUNTER — Emergency Department (HOSPITAL_COMMUNITY): Payer: BC Managed Care – PPO

## 2013-01-22 VITALS — BP 136/72 | HR 65 | Temp 97.4°F | Resp 17 | Ht 60.0 in | Wt 104.0 lb

## 2013-01-22 DIAGNOSIS — N390 Urinary tract infection, site not specified: Secondary | ICD-10-CM | POA: Insufficient documentation

## 2013-01-22 DIAGNOSIS — F05 Delirium due to known physiological condition: Secondary | ICD-10-CM

## 2013-01-22 DIAGNOSIS — Z862 Personal history of diseases of the blood and blood-forming organs and certain disorders involving the immune mechanism: Secondary | ICD-10-CM | POA: Insufficient documentation

## 2013-01-22 DIAGNOSIS — R413 Other amnesia: Secondary | ICD-10-CM

## 2013-01-22 DIAGNOSIS — Z79899 Other long term (current) drug therapy: Secondary | ICD-10-CM | POA: Insufficient documentation

## 2013-01-22 DIAGNOSIS — J45909 Unspecified asthma, uncomplicated: Secondary | ICD-10-CM | POA: Insufficient documentation

## 2013-01-22 DIAGNOSIS — I10 Essential (primary) hypertension: Secondary | ICD-10-CM | POA: Insufficient documentation

## 2013-01-22 DIAGNOSIS — R410 Disorientation, unspecified: Secondary | ICD-10-CM

## 2013-01-22 DIAGNOSIS — E039 Hypothyroidism, unspecified: Secondary | ICD-10-CM | POA: Insufficient documentation

## 2013-01-22 DIAGNOSIS — Z8639 Personal history of other endocrine, nutritional and metabolic disease: Secondary | ICD-10-CM | POA: Insufficient documentation

## 2013-01-22 DIAGNOSIS — Z8669 Personal history of other diseases of the nervous system and sense organs: Secondary | ICD-10-CM | POA: Insufficient documentation

## 2013-01-22 DIAGNOSIS — G479 Sleep disorder, unspecified: Secondary | ICD-10-CM | POA: Insufficient documentation

## 2013-01-22 DIAGNOSIS — Z8709 Personal history of other diseases of the respiratory system: Secondary | ICD-10-CM | POA: Insufficient documentation

## 2013-01-22 DIAGNOSIS — E876 Hypokalemia: Secondary | ICD-10-CM | POA: Insufficient documentation

## 2013-01-22 DIAGNOSIS — Z8679 Personal history of other diseases of the circulatory system: Secondary | ICD-10-CM | POA: Insufficient documentation

## 2013-01-22 DIAGNOSIS — F29 Unspecified psychosis not due to a substance or known physiological condition: Secondary | ICD-10-CM | POA: Insufficient documentation

## 2013-01-22 LAB — COMPREHENSIVE METABOLIC PANEL
ALT: 30 U/L (ref 0–35)
AST: 47 U/L — ABNORMAL HIGH (ref 0–37)
AST: 41 U/L — ABNORMAL HIGH (ref 0–37)
Albumin: 5.2 g/dL (ref 3.5–5.2)
BUN: 17 mg/dL (ref 6–23)
CO2: 26 mEq/L (ref 19–32)
CO2: 24 mEq/L (ref 19–32)
Calcium: 10.2 mg/dL (ref 8.4–10.5)
Calcium: 10 mg/dL (ref 8.4–10.5)
Chloride: 104 mEq/L (ref 96–112)
GFR calc non Af Amer: >90 mL/min (ref >90)
Glucose, Bld: 99 mg/dL (ref 70–99)
Potassium: 4.2 mEq/L (ref 3.5–5.3)
Sodium: 136 mEq/L (ref 135–145)
Total Protein: 7.7 g/dL (ref 6.0–8.3)

## 2013-01-22 LAB — POCT CBC
Granulocyte percent: 59.9 %G (ref 37–80)
HCT, POC: 45.9 % (ref 37.7–47.9)
Hemoglobin: 14.8 g/dL (ref 12.2–16.2)
MCV: 94.7 fL (ref 80–97)
Platelet Count, POC: 326 10*3/uL (ref 142–424)
RBC: 4.85 M/uL (ref 4.04–5.48)

## 2013-01-22 LAB — URINE MICROSCOPIC-ADD ON

## 2013-01-22 LAB — GLUCOSE, CAPILLARY

## 2013-01-22 LAB — URINALYSIS, ROUTINE W REFLEX MICROSCOPIC
Bilirubin Urine: NEGATIVE
Glucose, UA: NEGATIVE mg/dL
Hgb urine dipstick: NEGATIVE
Specific Gravity, Urine: 1.027 (ref 1.005–1.030)
pH: 5 (ref 5.0–8.0)

## 2013-01-22 LAB — LIPID PANEL: HDL: 95 mg/dL (ref >39)

## 2013-01-22 LAB — POCT UA - MICROSCOPIC ONLY
Casts, Ur, LPF, POC: NEGATIVE
Crystals, Ur, HPF, POC: NEGATIVE
Mucus, UA: NEGATIVE
Yeast, UA: NEGATIVE

## 2013-01-22 LAB — POCT URINALYSIS DIPSTICK
Ketones, UA: NEGATIVE
Protein, UA: NEGATIVE
Spec Grav, UA: 1.015
Urobilinogen, UA: 0.2
pH, UA: 5

## 2013-01-22 LAB — T4, FREE: Free T4: 1.09 ng/dL (ref 0.80–1.80)

## 2013-01-22 LAB — CBC
HCT: 41.3 % (ref 36.0–46.0)
Hemoglobin: 14.2 g/dL (ref 12.0–15.0)
MCH: 30.7 pg (ref 26.0–34.0)
MCV: 89.2 fL (ref 78.0–100.0)
RBC: 4.63 MIL/uL (ref 3.87–5.11)
WBC: 9.7 10*3/uL (ref 4.0–10.5)

## 2013-01-22 LAB — GLUCOSE, POCT (MANUAL RESULT ENTRY): POC Glucose: 93 mg/dl (ref 70–99)

## 2013-01-22 MED ORDER — POTASSIUM CHLORIDE CRYS ER 20 MEQ PO TBCR
40.0000 meq | EXTENDED_RELEASE_TABLET | Freq: Once | ORAL | Status: AC
  Administered 2013-01-22: 40 meq via ORAL
  Filled 2013-01-22: qty 2

## 2013-01-22 MED ORDER — CIPROFLOXACIN HCL 500 MG PO TABS: 500.0000 mg | ORAL_TABLET | Freq: Two times a day (BID) | ORAL | Status: DC

## 2013-01-22 MED ORDER — CIPROFLOXACIN HCL 500 MG PO TABS
500.0000 mg | ORAL_TABLET | Freq: Once | ORAL | Status: AC
  Administered 2013-01-22: 500 mg via ORAL
  Filled 2013-01-22: qty 1

## 2013-01-22 MED ORDER — TRAZODONE 25 MG HALF TABLET: 25.0000 mg | ORAL_TABLET | Freq: Every evening | ORAL | Status: DC | PRN

## 2013-01-22 NOTE — Progress Notes (Signed)
[address] [place] [address]   Phone [phone]  Subjective:    Patient ID: Cheryl [name], female    DOB: 02-10-1946, 67 y.o.   MRN: [id number]  HPI Pt presents to clinic with her husband.  They present for a CPE but husband states her her memory and her concentration over the last month has significantly decreased and he and his daughter are worried and they are overwhelmed because of her confusion and lack of concentration.  Per her husband she is unable to concentrate on anything.  She changes conversation multiple times and then gets confused.  She is not finishing her thoughts when talking.  He has stopped leaving the care at home because he is unsure what she will do.  She stays home alone all day.  He feels like a month ago she was at her normal but she has significantly decreased over the last month.  To the best of his knowledge she has not done anything to hurt herself.  She is not sure about her medical history or her current medications.  He does state that she does not eat like she used to.  On Friday husband cleaned the house for a party and sat am she had been through everything and pulled stuff out of drawers -like she was looking for something.  He states she is no longer sleeping, she never comes to bed.  Pt is worried about her hypothyroidism and tells me multiple times that she is so thirsty and do dry mouthed and cold (our rooms are really cold).  Each concern or question brings up another concern for the patient.  During our conversation she has flight of ideas and I was unable to get answers to a lot of my questions- she would start to answer my question and then start talking about something different.    Review of Systems  Constitutional: Negative for fever and chills.  HENT: Positive for hearing loss (pt wears hearing aids but has lost the L one due to the dog eating it - she has "trouble hearing things because of bad acoustics").   Cardiovascular: Negative for chest  pain.  Genitourinary: Positive for hematuria (in the past several days bt none this am). Negative for dysuria.  Skin: Positive for wound.  Neurological: Negative for speech difficulty and headaches.  Psychiatric/Behavioral: Positive for confusion and decreased concentration.   Difficult to obtain.    Objective:   Physical Exam  Vitals reviewed. Constitutional: She is oriented to person, place, and time. She appears well-developed and well-nourished.  Pt asks multiple times in the room where her purse is even when it is right beside her.  She is unable to find paperwork in her purse - states it is a new purse.  Most of the paperwork in the purse seems really old.  HENT:  Head: Normocephalic and atraumatic.  Right Ear: External ear normal.  Left Ear: External ear normal.  Mouth/Throat: Mucous membranes are dry.  cracked and dry lips - pt drinks water through most of the visit - pt takes a water bottle into the bathroom with her  Eyes: Conjunctivae are normal. Pupils are equal, round, and reactive to light.  Neck: Normal range of motion. Neck supple. No thyromegaly present.  Cardiovascular: Normal rate, regular rhythm and normal heart sounds.   No murmur heard. Pulmonary/Chest: Effort normal and breath sounds normal.  Abdominal: Soft. There is no tenderness. There is no CVA tenderness.  Neurological: She is alert and  oriented to person, place, and time. She has normal strength and normal reflexes. No cranial nerve deficit or sensory deficit.  Skin: Skin is warm and dry.  Psychiatric: She has a normal mood and affect. Her behavior is normal. Judgment and thought content normal. Her speech is tangential.   Mini-mental status - scanned in - did not spell WORLD correctly backwards - her clock is backwards with the incorrect time and the hexagons do not intersect -- everything else was fine (sent a copy with her husband)  Results for orders placed in visit on 01/22/13  POCT URINALYSIS  DIPSTICK      Result Value Range   Color, UA yellow     Clarity, UA clear     Glucose, UA neg     Bilirubin, UA neg     Ketones, UA neg     Spec Grav, UA 1.015     Blood, UA neg     pH, UA 5.0     Protein, UA neg     Urobilinogen, UA 0.2     Nitrite, UA neg     Leukocytes, UA small (1+)    POCT UA - MICROSCOPIC ONLY      Result Value Range   WBC, Ur, HPF, POC 0-3     RBC, urine, microscopic neg     Bacteria, U Microscopic neg     Mucus, UA neg'     Epithelial cells, urine per micros 1-3     Crystals, Ur, HPF, POC neg     Casts, Ur, LPF, POC neg     Yeast, UA neg    POCT CBC      Result Value Range   WBC 9.5  4.6 - 10.2 K/uL   Lymph, poc 3.1  0.6 - 3.4   POC LYMPH PERCENT 32.9  10 - 50 %L   MID (cbc) 0.7  0 - 0.9   POC MID % 7.2  0 - 12 %M   POC Granulocyte 5.7  2 - 6.9   Granulocyte percent 59.9  37 - 80 %G   RBC 4.85  4.04 - 5.48 M/uL   Hemoglobin 14.8  12.2 - 16.2 g/dL   HCT, POC 16.1  09.6 - 47.9 %   MCV 94.7  80 - 97 fL   MCH, POC 30.5  27 - 31.2 pg   MCHC 32.2  31.8 - 35.4 g/dL   RDW, POC 04.5     Platelet Count, POC 326  142 - 424 K/uL   MPV 7.8  0 - 99.8 fL  GLUCOSE, POCT (MANUAL RESULT ENTRY)      Result Value Range   POC Glucose 93  70 - 99 mg/dl   EKG - a lot of artifact - no acute changes     Assessment & Plan:  Memory problem - Plan: POCT urinalysis dipstick, POCT UA - Microscopic Only, POCT CBC, Comprehensive metabolic panel, Lipid panel, POCT glucose (manual entry), EKG 12-Lead, Vitamin B12  Unspecified hypothyroidism - Plan: T4, Free, T3, Free, TSH, Vitamin B12  Confusion -  Pt does not appear to be confused but her concentration and her thought pattern do not appear normal, though I have never meet the patient.  She her memory seems intact as she is able to remember most things but seem does seem confused but during the visit she makes excuses to explain (new purse, short cannot see over things, etc) why she is confused.  She is able to  differentiate among  fact and fiction (our rooms are cold and when asked about the season she states in the room it is winter but outside it is summer). Due to complexity of the patient - to [facility] where they can do stat electrolytes to look for imbalances, possible head CT and psych consult.  D/w Dr Patsy Lager.  Benny Lennert PA-C 01/22/2013 1:51 PM

## 2013-01-22 NOTE — Progress Notes (Signed)
01/22/2013 A. Devika Dragovich RNCM 1637pm EDCM went back to patient's room to speak to the patient about possible home health needs.  As per patient,  "I don't think I need that right now.  I know when and how to take my medications."  Patient currently is unhappy with Dr. Wynelle Link.  Explained o patient that she needs to find a pcp in order to be seemn by home health.  Instructed patient and husband to call the phone number on the back of her insurance card to find a doctor who is within network.  Expalined to patient in detail the benefits of having a visiting RN.  EDCM asked patient why she did not take her synthroid for a month. Patient replied, "Well, we didn't have the money at the time.  My hisband was having trouble with his two cars."  Patient's husband remarked to patient, "You ran out of your medications and didn't tell anyone."  [facility] asked patient's husband again if he thought they would benefit from a visiting RN.  Patient's husband stated, "I don't think so at this time. We will see what the tests say."  EDCM explained to patient and husband, that if they decide they need more help at home, home health is an option and referred back to home health agency list.

## 2013-01-22 NOTE — Progress Notes (Signed)
   01/22/2013 A. [name] RNCM 1600pm EDCM consulted to speak to patient and family regarding possible home health needs.  Patient currently at testing, patient's husband Del at bedside.  EDCM addressed patient's husband as Mr. Perret, patient's husband replied, "I guess, that's what they call me."  EDCM explained to patient's husband, a home health agency can provide a visiting RN, PT, OT, aide and a Child psychotherapist.  EDCM explained to patient's husband a Child psychotherapist is ordered if the patient needs to be placed in a SNF or rehab.  Patient's husband replied, "I guess, I really don't know what all of this is.  It's really not up to me is it?"  "Shouldn't we wait until all of the tests come back."  [facility] explained to patient's husband, that if the patient is to be discharged home, we can start the home health process here in the hospital.  If the patient is admitted to the hospital, another case manager will assist with discharge palnning on the unit.  EDCM provided a list of home health agencies in [facility] and asked patient's husband to review the list until his wife's testing is complete.

## 2013-01-22 NOTE — ED Notes (Signed)
Seen at urgent care today and referred here for evaluation of confusion, memory loss, and sleeplessness.x 1 month

## 2013-01-22 NOTE — ED Provider Notes (Signed)
CSN: [id number]     Arrival date & time 01/22/13  1444 History     First MD Initiated Contact with Patient 01/22/13 [phone]     Chief Complaint  Patient presents with  . Altered Mental Status   (Consider location/radiation/quality/duration/timing/severity/associated sxs/prior Treatment) The history is provided by the patient.  Cheryl [name] is a 67 y.o. female hx of asthma, vascular dementia presenting with confusion and memory loss and lack of sleep. She ran out of Synthroid 3 weeks ago when the symptoms began. She was able to refill her Synthroid a week ago. Her symptoms were occasional confusion as per husband as well as some memory loss and unable to sleep at night. Denies any other medication changes or fevers or chills or urinary symptoms. She went to urgent care today and had normal CBC and UA was sent in for evaluation. Denies any suicidal or homicidal ideations. Denies any psych history in the past and no hallucinations.   Level V caveat- dementia    Past Medical History  Diagnosis Date  . Allergic rhinitis   . Asthma   . Emphysema   . Hypertension   . Abnormal heart rhythm   . Partial deafness   . Hyperlipidemia    Past Surgical History  Procedure Laterality Date  . Cholecystectomy    . Back surgery    . Nasal sinus surgery    . Knee surgery    . Tubal ligation    . Ear operations    . 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 Abdominal hysterectomy     Family History  Problem Relation Age of Onset  . Allergies Sister     X2  . Heart disease Mother   . Clotting disorder Mother   . Cancer Mother    History  Substance Use Topics  . Smoking status: Never Smoker   . Smokeless tobacco: Never Used  . Alcohol Use: No   OB History   Grav Para Term Preterm Abortions TAB SAB Ect Mult Living                 Review of Systems  Psychiatric/Behavioral: Positive for confusion, sleep disturbance and altered mental status.  All other systems reviewed and are  negative.    Allergies  Aspirin and Codeine  Home Medications   Current Outpatient Rx  Name  Route  Sig  Dispense  Refill  . albuterol (PROAIR HFA) 108 (90 BASE) MCG/ACT inhaler      INHALE 2 PUFFS INTO THE LUNGS EVERY 6 HOURS AS NEEDED FOR WHEEZING   8.5 g   5   . amLODipine-benazepril (LOTREL) 5-20 MG per capsule   Oral   Take 1 capsule by mouth daily.          . brimonidine (ALPHAGAN) 0.2 % ophthalmic solution   Both Eyes   Place 1 drop into both eyes 3 (three) times daily.         . Fluticasone-Salmeterol (ADVAIR DISKUS) 500-50 MCG/DOSE AEPB   Inhalation   Inhale 1 puff into the lungs 2 (two) times daily.   60 each   5   . hydrochlorothiazide (HYDRODIURIL) 12.5 MG tablet   Oral   Take 12.5 mg by mouth Daily.          [name] levothyroxine (SYNTHROID, LEVOTHROID) 112 MCG tablet   Oral   Take 112 mcg by mouth daily before breakfast.         . Loperamide-Simethicone (  IMODIUM ADVANCED) 2-125 MG CHEW   Oral   Chew by mouth as needed.           BP 151/72  Pulse 79  Temp(Src) 98.1 F (36.7 C) (Oral)  Resp 14  Ht 5' (1.524 m)  Wt 104 lb (47.174 kg)  BMI 20.31 kg/m2  SpO2 93% Physical Exam  Nursing note and vitals reviewed. Constitutional: She is oriented to person, place, and time.  Chronically ill, talkative, NAD, hard of hearing   HENT:  Head: Normocephalic.  Mouth/Throat: Oropharynx is clear and moist.  Eyes: Conjunctivae are normal. Pupils are equal, round, and reactive to light.  Neck: Normal range of motion. Neck supple.  Cardiovascular: Normal rate, regular rhythm and normal heart sounds.   Pulmonary/Chest: Effort normal and breath sounds normal. No respiratory distress. She has no wheezes. She has no rales.  Abdominal: Soft. Bowel sounds are normal. She exhibits no distension. There is no tenderness. There is no rebound and no guarding.  Musculoskeletal: Normal range of motion.  Neurological: She is alert and oriented to person, place, and  time.  Nl strength and sensation throughout.   Skin: Skin is warm and dry.  Psychiatric:  Not depressed or confused. Tangential in her thought process.     ED Course   Procedures (including critical care time)  Labs Reviewed  COMPREHENSIVE METABOLIC PANEL - Abnormal; Notable for the following:    Potassium 3.2 (*)    Glucose, Bld 205 (*)    AST 41 (*)    All other components within normal limits  URINALYSIS, ROUTINE W REFLEX MICROSCOPIC - Abnormal; Notable for the following:    APPearance CLOUDY (*)    Leukocytes, UA MODERATE (*)    All other components within normal limits  GLUCOSE, CAPILLARY - Abnormal; Notable for the following:    Glucose-Capillary 192 (*)    All other components within normal limits  URINE MICROSCOPIC-ADD ON - Abnormal; Notable for the following:    Squamous Epithelial / LPF FEW (*)    Bacteria, UA FEW (*)    All other components within normal limits  URINE CULTURE  CBC   Dg Chest 2 View  01/22/2013   *RADIOLOGY REPORT*  Clinical Data: Altered mental status.  Emphysema.  Abnormal heart rhythm.  CHEST - 2 VIEW  Comparison: Two-view chest 05/11/2012.  Findings: Mild cardiac enlargement is present.  Diffuse emphysematous changes are again noted.  Mild interstitial prominence has increased slightly, suggesting minimal edema.  There are no significant effusions.  IMPRESSION: Mild cardiomegaly and minimal edema superimposed on emphysema.   Original Report Authenticated By: Marin Roberts, M.D.   Ct Head Wo Contrast  01/22/2013   *RADIOLOGY REPORT*  Clinical Data: Confusion.  Memory loss.  CT HEAD WITHOUT CONTRAST  Technique:  Contiguous axial images were obtained from the base of the skull through the vertex without contrast.  Comparison: 06/27/2005  Findings: There is atrophy and chronic small vessel disease changes. No acute intracranial abnormality.  Specifically, no hemorrhage, hydrocephalus, mass lesion, acute infarction, or significant intracranial injury.   No acute calvarial abnormality.  IMPRESSION: No acute intracranial abnormality.  Atrophy, chronic microvascular disease.   Original Report Authenticated By: Charlett Nose, M.D.   No diagnosis found.   MDM  Cheryl [name] is a 67 y.o. female here with worsening memory loss, lack of sleep. I think it may be worsening dementia vs synthroid uncompliance. Will do CT head to r/o stroke. Will get labs to r/o electrolyte imbalance. Will get  CXR and UA to r/o infectious etiology.    5:28 PM UA showed mod leuks. Will give a course of cipro. K 3.2, supplemented. CT head showed no obvious stroke, CXR showed no infiltrate. I think her symptoms are multifactorial- lack of sleep, worsening dementia, medication uncompliance, UTI. Will give cipro for UTI, low dose trazodone for sleep. Will have her f/u with her doctor. I called case management but patient and husband doesn't want more care at home or placement.   Richardean Canal, MD 01/22/13 [phone]

## 2013-01-23 DIAGNOSIS — R4182 Altered mental status, unspecified: Secondary | ICD-10-CM | POA: Insufficient documentation

## 2013-01-23 DIAGNOSIS — J4489 Other specified chronic obstructive pulmonary disease: Secondary | ICD-10-CM | POA: Insufficient documentation

## 2013-01-23 DIAGNOSIS — J438 Other emphysema: Secondary | ICD-10-CM | POA: Insufficient documentation

## 2013-01-23 DIAGNOSIS — Z91199 Patient's noncompliance with other medical treatment and regimen due to unspecified reason: Secondary | ICD-10-CM | POA: Insufficient documentation

## 2013-01-23 DIAGNOSIS — E890 Postprocedural hypothyroidism: Principal | ICD-10-CM | POA: Insufficient documentation

## 2013-01-23 DIAGNOSIS — Z9119 Patient's noncompliance with other medical treatment and regimen: Secondary | ICD-10-CM | POA: Insufficient documentation

## 2013-01-23 DIAGNOSIS — F3110 Bipolar disorder, current episode manic without psychotic features, unspecified: Secondary | ICD-10-CM | POA: Insufficient documentation

## 2013-01-23 DIAGNOSIS — Z79899 Other long term (current) drug therapy: Secondary | ICD-10-CM | POA: Insufficient documentation

## 2013-01-23 DIAGNOSIS — I10 Essential (primary) hypertension: Secondary | ICD-10-CM | POA: Insufficient documentation

## 2013-01-23 DIAGNOSIS — E785 Hyperlipidemia, unspecified: Secondary | ICD-10-CM | POA: Insufficient documentation

## 2013-01-23 DIAGNOSIS — G9340 Encephalopathy, unspecified: Secondary | ICD-10-CM | POA: Insufficient documentation

## 2013-01-23 DIAGNOSIS — J449 Chronic obstructive pulmonary disease, unspecified: Secondary | ICD-10-CM | POA: Insufficient documentation

## 2013-01-23 DIAGNOSIS — R0602 Shortness of breath: Secondary | ICD-10-CM | POA: Insufficient documentation

## 2013-01-23 NOTE — ED Notes (Signed)
SPOUSE REPORTED PT. HAS EXERTIONAL DYSPNEA WITH CONFUSION FOR SEVERAL DAYS , SENN AT [facility] YESTERDAY DIAGNOSED WITH UTI /CONFUSION . DENIES COUGH / NO FEVER OR CHILLS.

## 2013-01-24 ENCOUNTER — Emergency Department (HOSPITAL_COMMUNITY)
Admit: 2013-01-24 | Discharge: 2013-01-24 | Disposition: A | Discharge status: Home / Self Care | Payer: BC Managed Care – PPO | Attending: Emergency Medicine | Admitting: Emergency Medicine

## 2013-01-24 ENCOUNTER — Encounter (HOSPITAL_COMMUNITY): Payer: Self-pay | Admitting: Emergency Medicine

## 2013-01-24 ENCOUNTER — Observation Stay (HOSPITAL_COMMUNITY)
Admission: EM | Admit: 2013-01-24 | Discharge: 2013-01-25 | DRG: 300 | Disposition: A | Discharge status: Home / Self Care | Payer: BC Managed Care – PPO | Attending: Family Medicine | Admitting: Family Medicine

## 2013-01-24 DIAGNOSIS — I10 Essential (primary) hypertension: Secondary | ICD-10-CM | POA: Diagnosis present

## 2013-01-24 DIAGNOSIS — E038 Other specified hypothyroidism: Secondary | ICD-10-CM

## 2013-01-24 DIAGNOSIS — J309 Allergic rhinitis, unspecified: Secondary | ICD-10-CM | POA: Diagnosis present

## 2013-01-24 DIAGNOSIS — J45909 Unspecified asthma, uncomplicated: Secondary | ICD-10-CM | POA: Diagnosis present

## 2013-01-24 DIAGNOSIS — F319 Bipolar disorder, unspecified: Secondary | ICD-10-CM

## 2013-01-24 DIAGNOSIS — Z9089 Acquired absence of other organs: Secondary | ICD-10-CM

## 2013-01-24 DIAGNOSIS — R0600 Dyspnea, unspecified: Secondary | ICD-10-CM

## 2013-01-24 DIAGNOSIS — E785 Hyperlipidemia, unspecified: Secondary | ICD-10-CM | POA: Diagnosis present

## 2013-01-24 DIAGNOSIS — E890 Postprocedural hypothyroidism: Secondary | ICD-10-CM

## 2013-01-24 DIAGNOSIS — R4182 Altered mental status, unspecified: Secondary | ICD-10-CM | POA: Diagnosis present

## 2013-01-24 DIAGNOSIS — E039 Hypothyroidism, unspecified: Secondary | ICD-10-CM | POA: Diagnosis present

## 2013-01-24 HISTORY — DX: Shortness of breath: R06.02

## 2013-01-24 HISTORY — DX: Family history of other specified conditions: Z84.89

## 2013-01-24 HISTORY — DX: Cardiomegaly: I51.7

## 2013-01-24 HISTORY — DX: Hypothyroidism, unspecified: E03.9

## 2013-01-24 LAB — URINALYSIS, ROUTINE W REFLEX MICROSCOPIC
Glucose, UA: NEGATIVE mg/dL
Protein, ur: 100 mg/dL — AB
pH: 5.5 (ref 5.0–8.0)

## 2013-01-24 LAB — CBC WITH DIFFERENTIAL/PLATELET
Eosinophils Relative: 4 % (ref 0–5)
HCT: 41.6 % (ref 36.0–46.0)
Lymphocytes Relative: 20 % (ref 12–46)
Lymphs Abs: 1.9 10*3/uL (ref 0.7–4.0)
MCV: 89.3 fL (ref 78.0–100.0)
Monocytes Absolute: 0.5 10*3/uL (ref 0.1–1.0)
Neutro Abs: 6.8 10*3/uL (ref 1.7–7.7)
RBC: 4.66 MIL/uL (ref 3.87–5.11)
WBC: 9.5 10*3/uL (ref 4.0–10.5)

## 2013-01-24 LAB — BASIC METABOLIC PANEL
CO2: 25 mEq/L (ref 19–32)
Calcium: 10.1 mg/dL (ref 8.4–10.5)
Chloride: 105 mEq/L (ref 96–112)
Glucose, Bld: 123 mg/dL — ABNORMAL HIGH (ref 70–99)
Sodium: 140 mEq/L (ref 135–145)

## 2013-01-24 LAB — CBC
MCV: 90.3 fL (ref 78.0–100.0)
Platelets: 274 10*3/uL (ref 150–400)
RDW: 14.9 % (ref 11.5–15.5)
WBC: 9 10*3/uL (ref 4.0–10.5)

## 2013-01-24 LAB — RPR: RPR Ser Ql: NONREACTIVE

## 2013-01-24 LAB — URINE MICROSCOPIC-ADD ON

## 2013-01-24 LAB — CREATININE, SERUM
GFR calc Af Amer: >90 mL/min (ref >90)
GFR calc non Af Amer: 88 mL/min — ABNORMAL LOW (ref >90)

## 2013-01-24 MED ORDER — LEVOTHYROXINE SODIUM 112 MCG PO TABS
112.0000 ug | ORAL_TABLET | Freq: Every day | ORAL | Status: DC
  Administered 2013-01-24 – 2013-01-25 (×2): 112 ug via ORAL
  Filled 2013-01-24 (×4): qty 1

## 2013-01-24 MED ORDER — ONDANSETRON HCL 4 MG PO TABS: 4.0000 mg | ORAL_TABLET | Freq: Four times a day (QID) | ORAL | Status: DC | PRN

## 2013-01-24 MED ORDER — HYDROCHLOROTHIAZIDE 12.5 MG PO CAPS
12.5000 mg | ORAL_CAPSULE | Freq: Every day | ORAL | Status: DC
  Administered 2013-01-24 – 2013-01-25 (×2): 12.5 mg via ORAL
  Filled 2013-01-24 (×2): qty 1

## 2013-01-24 MED ORDER — ALBUTEROL SULFATE HFA 108 (90 BASE) MCG/ACT IN AERS: 2.0000 | INHALATION_SPRAY | RESPIRATORY_TRACT | Status: DC | PRN

## 2013-01-24 MED ORDER — MOMETASONE FURO-FORMOTEROL FUM 200-5 MCG/ACT IN AERO
2.0000 | INHALATION_SPRAY | Freq: Two times a day (BID) | RESPIRATORY_TRACT | Status: DC
  Administered 2013-01-24 – 2013-01-25 (×3): 2 via RESPIRATORY_TRACT
  Filled 2013-01-24: qty 8.8

## 2013-01-24 MED ORDER — ALBUTEROL SULFATE HFA 108 (90 BASE) MCG/ACT IN AERS
1.0000 | INHALATION_SPRAY | RESPIRATORY_TRACT | Status: DC | PRN
  Filled 2013-01-24: qty 6.7

## 2013-01-24 MED ORDER — ACETAMINOPHEN 650 MG RE SUPP: 650.0000 mg | Freq: Four times a day (QID) | RECTAL | Status: DC | PRN

## 2013-01-24 MED ORDER — BRIMONIDINE TARTRATE 0.2 % OP SOLN
1.0000 [drp] | Freq: Three times a day (TID) | OPHTHALMIC | Status: DC
  Administered 2013-01-24 – 2013-01-25 (×4): 1 [drp] via OPHTHALMIC
  Filled 2013-01-24: qty 5

## 2013-01-24 MED ORDER — SODIUM CHLORIDE 0.9 % IV SOLN
INTRAVENOUS | Status: DC
  Administered 2013-01-24 (×2): via INTRAVENOUS

## 2013-01-24 MED ORDER — ARIPIPRAZOLE 5 MG PO TABS
5.0000 mg | ORAL_TABLET | Freq: Two times a day (BID) | ORAL | Status: DC
  Administered 2013-01-24 – 2013-01-25 (×2): 5 mg via ORAL
  Filled 2013-01-24 (×4): qty 1

## 2013-01-24 MED ORDER — SODIUM CHLORIDE 0.9 % IJ SOLN: 3.0000 mL | Freq: Two times a day (BID) | INTRAMUSCULAR | Status: DC

## 2013-01-24 MED ORDER — MIRTAZAPINE 7.5 MG PO TABS
7.5000 mg | ORAL_TABLET | Freq: Every day | ORAL | Status: DC
  Administered 2013-01-24: 7.5 mg via ORAL
  Filled 2013-01-24 (×2): qty 1

## 2013-01-24 MED ORDER — HEPARIN SODIUM (PORCINE) 5000 UNIT/ML IJ SOLN
5000.0000 [IU] | Freq: Three times a day (TID) | INTRAMUSCULAR | Status: DC
  Administered 2013-01-24 – 2013-01-25 (×3): 5000 [IU] via SUBCUTANEOUS
  Filled 2013-01-24 (×6): qty 1

## 2013-01-24 MED ORDER — HYDROCHLOROTHIAZIDE 25 MG PO TABS
12.5000 mg | ORAL_TABLET | Freq: Every day | ORAL | Status: DC
  Filled 2013-01-24: qty 0.5

## 2013-01-24 MED ORDER — ACETAMINOPHEN 325 MG PO TABS: 650.0000 mg | ORAL_TABLET | Freq: Four times a day (QID) | ORAL | Status: DC | PRN

## 2013-01-24 MED ORDER — AMLODIPINE BESYLATE 5 MG PO TABS
5.0000 mg | ORAL_TABLET | Freq: Every day | ORAL | Status: DC
  Administered 2013-01-24 – 2013-01-25 (×2): 5 mg via ORAL
  Filled 2013-01-24 (×2): qty 1

## 2013-01-24 MED ORDER — BENAZEPRIL HCL 20 MG PO TABS
20.0000 mg | ORAL_TABLET | Freq: Every day | ORAL | Status: DC
  Administered 2013-01-25: 20 mg via ORAL
  Filled 2013-01-24 (×2): qty 1

## 2013-01-24 MED ORDER — ASPIRIN 81 MG PO CHEW: 81.0000 mg | CHEWABLE_TABLET | Freq: Once | ORAL | Status: DC

## 2013-01-24 MED ORDER — ONDANSETRON HCL 4 MG/2ML IJ SOLN: 4.0000 mg | Freq: Four times a day (QID) | INTRAMUSCULAR | Status: DC | PRN

## 2013-01-24 NOTE — H&P (Signed)
Family Medicine Teaching [facility] Admission History and Physical Service Pager: [phone]  Patient name: Cheryl [name] Medical record number: [id number] Date of birth: 1945-11-26 Age: 67 y.o. Gender: female  Primary Care Provider: Leanor Rubenstein, MD Consultants: psych (pending) Code Status: presumed full  Chief Complaint: confusion, poor sleep, rapid speech  Assessment and Plan: Cheryl [name] is a 68 y.o. year old female presenting with presenting with very poor memory, pressured speech, poor sleep, confusion. PMH significant for asthma, hypothyroidism (?Hashimoto, ?s/p total thyroidectomy), poor hearing. Recent labwork shows profound hypothyroidism with TSH >120. Other labs generally unremarkable; treated for "UTI," but culture grew only 30k CFU/mL of mixed types.  #Severe Hypothyroidism - uncertain cause, as pt is a poor historian and details are unclear -possible hx of Hashimoto thyroiditis and/or total thyroidecomy; pt out of Synthroid for one month, recently -reordered Synthroid, though may need to consider other therapies [ ]  f/u echo (cardiomegaly noted on CXR)  #Altered mental status - likely component of hypothyroidism, but some features suggestive of mania/hypomania -no hx of mental disorder or treatment, to husband's knowledge; chronic microvascular disease on CT -will request psychiatry consult and f/u any recommendations  #Asthma - CXR findings consistent with emphysema and cardiomegaly -continue Dulera (home Advair), albuterol PRN  #HTN - fluctuating with agitation, generally ~130's-160's/70's-80's -continue home amlodipine 5, Lotensin 20, HCTZ 12.5  #Hyperlipidemia - LDL >200 at recent ED visit; CT shows chronic microvascular disease -will need to consider starting statin therapy  FEN/GI: heart-healthy diet, NS at 75 mL/h Prophylaxis: subQ heparin  Disposition: admitted to inpt, telemetry unit, attending Dr. Lum Babe (day attending Drs. Funches and  Sheffield Slider)  -management as above; dispo planning uncertain at this point  History of Present Illness: Cheryl [name] is a 67 y.o. year old female presenting with very poor memory, pressured speech, poor sleep, confusion. PMH significant for asthma, hypothyroidism (?Hashimoto, ?s/p total thyroidectomy), poor hearing. Pt is a very poor historian in general and pt's husband can only clarify some details; reportedly pt had normal mental status as recently as one month ago; followed by Dr. Lucianne Muss of endocrine and [facility]. Pt ran out of Synthroid between 2-4 weeks ago and did not tell anyone she needed it refilled; per husband, she "gets like this" when she goes without Synthroid for extended periods. Pt has not been eating for the past 2-3 days, has very poor sleep if any at all, pressured speech, and family has been having a difficult time caring for her, "managing her" at home. Pt did refill Rx for Synthroid yesterday but symptoms are unchanged.  Pt presented to [facility] 8/5 to establish care but this visit was changed to an acute visit due to the above symptoms and difficulty obtaining a history; thyroid panel was drawn (TSH found to be >120) and pt was referred to [facility] ED. There pt had basic labs, lipid panel (labs unremarkable other than LDL 252), and UA was suggestive of UTI, so pt was given Rx for Cipro and trazodone for sleep, but refused to take Cipro until coaxed numerous times by family members (pt stated that Cipro is "like halothane, like they give people for anaesthesia, and it hurts your liver in a delayed way, like two or three days or weeks later, like my twin sister and my son who got medicines like that and both almost died." Otherwise pt has vague complaints other than ?thirst and ?SOB, and often does not finish her thoughts so ROS is unreliable.  Review Of Systems: Per HPI. Level 5 caveat due to mental status as noted above.  Patient Active Problem List    Diagnosis Date Noted  . CANDIDIASIS, ORAL 03/06/2009  . OTHER OSTEOPOROSIS 06/10/2008  . HYPERTENSION 05/09/2008  . ALLERGIC RHINITIS 05/09/2008  . ASTHMA 05/09/2008   Past Medical History: Past Medical History  Diagnosis Date  . Allergic rhinitis   . Asthma   . Emphysema   . Hypertension   . Abnormal heart rhythm   . Partial deafness   . Hyperlipidemia    Past Surgical History: Past Surgical History  Procedure Laterality Date  . Cholecystectomy    . Back surgery    . Nasal sinus surgery    . Knee surgery    . Tubal ligation    . Ear operations    . 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 Abdominal hysterectomy     Social History: History  Substance Use Topics  . Smoking status: Never Smoker   . Smokeless tobacco: Never Used  . Alcohol Use: No   Additional social history: Please also refer to relevant sections of EMR.  Family History: Family History  Problem Relation Age of Onset  . Allergies Sister     X2  . Heart disease Mother   . Clotting disorder Mother   . Cancer Mother    Allergies and Medications: Allergies  Allergen Reactions  . Aspirin     REACTION: asthma flare  . Codeine     REACTION: ulcer   No current facility-administered medications on file prior to encounter.   Current Outpatient Prescriptions on File Prior to Encounter  Medication Sig Dispense Refill  . albuterol (PROAIR HFA) 108 (90 BASE) MCG/ACT inhaler INHALE 2 PUFFS INTO THE LUNGS EVERY 6 HOURS AS NEEDED FOR WHEEZING  8.5 g  5  . amLODipine-benazepril (LOTREL) 5-20 MG per capsule Take 1 capsule by mouth daily.       . brimonidine (ALPHAGAN) 0.2 % ophthalmic solution Place 1 drop into both eyes 3 (three) times daily.      . ciprofloxacin (CIPRO) 500 MG tablet Take 1 tablet (500 mg total) by mouth 2 (two) times daily. One po bid x 7 days  14 tablet  0  . Fluticasone-Salmeterol (ADVAIR DISKUS) 500-50 MCG/DOSE AEPB Inhale 1 puff into the lungs 2 (two) times daily.  60 each  5  .  hydrochlorothiazide (HYDRODIURIL) 12.5 MG tablet Take 12.5 mg by mouth Daily.       [name] levothyroxine (SYNTHROID, LEVOTHROID) 112 MCG tablet Take 112 mcg by mouth daily before breakfast.      . Loperamide-Simethicone (IMODIUM ADVANCED) 2-125 MG CHEW Chew by mouth as needed.       . traZODone (DESYREL) 25 mg TABS tablet Take 0.5 tablets (25 mg total) by mouth at bedtime as needed.  15 tablet  0   Objective: BP 127/66  Pulse 67  Temp(Src) 97.7 F (36.5 C) (Oral)  Resp 15  SpO2 94% Exam: General: elderly female, appears older than stated age, extremely tangential, difficult to focus, not in acute distress HEENT: limited exam due to cooperation/focus of pt, but [place]/AT, PERRLA, EOMI, MMM; poor dentition Cardiovascular: RRR, no murmur appreciated Respiratory: diffuse crackles but no increased WOB, no hypoxia, equal bilateral air movement Abdomen: soft, nontender, BS+ Extremities: moves all extremities equally, no LE edema Skin: warm, dry, intact Neuro: difficult to assess, but grip strength and LE strength 5/5 bilaterally, alert but unable to assess orientation  Grossly, no focal deficits; difficult to understand due to elocution but no frank dysarthria Psych: no psychosis or delusion, but pt is extremely tangential with some pressured speech  Mood not expansive or elevated, perhaps slightly irritable/agitated; affect congruent, thought content normal  Labs and Imaging: CBC BMET   Recent Labs Lab 01/22/13 1538  WBC 9.7  HGB 14.2  HCT 41.3  PLT 282    Recent Labs Lab 01/22/13 1538  NA 136  K 3.2*  CL 102  CO2 24  BUN 15  CREATININE 0.63  GLUCOSE 205*  CALCIUM 10.0     UA: cloudy, 100 protein, moderate leukocytes, trace Hb, but many squam cells  -similar UA 8/6, culture 8/6 (prior ED visit) with 30k CFU/mL multiple morphotypes  Micro:  UA 8/7 @0602 : PENDING (pt has had two doses of Cipro)  CXR 8/7, at @0023 : hyperexpansion consistent with emphysema, but no acute  effusion/consolidation  Recent ED visit (8/5)  TSH 126 (!!), free T4 1.09, free T3 2.1 (low)  Cholesterol 348, TG 56, HDL 95, LDL 242   Bobbye Morton, MD 01/24/2013, 5:10 AM PGY-2, [facility] Family Medicine FPTS Intern pager: [phone], text pages welcome

## 2013-01-24 NOTE — Progress Notes (Signed)
  Echocardiogram 2D Echocardiogram has been performed.  Georgian Co 01/24/2013, 6:07 PM

## 2013-01-24 NOTE — ED Notes (Signed)
Patient poor historian- pt's husband states that she was seen at [facility] yesterday and was diagnosed with a UTI and given a per scription for Cipro. Pt refusing to take medication per husband. Husband states that pt seems to have a altered mental status and thinks it because of the UTI but pt wont take prescription.

## 2013-01-24 NOTE — ED Notes (Signed)
Admitting MD at bedside.

## 2013-01-24 NOTE — H&P (Signed)
FMTS Attending Admission Note: Cheryl Mccarver,MD I  have seen and examined this patient, reviewed their chart. I have discussed this patient with the resident. I agree with the resident's findings, assessment and care plan.  67 Y/O F with PMX hypothyroidism s/p thyroidectomy,HTN,Asthma/COPD,presents to the hospital for confusion and poorly managed hypothyroidism.Patient had been out of her Synthroid for about a month,she was on Synthroid daily,she was seen by her PCP, TSH checked was more than 120 hence was advised to go to the ER 2 days ago,she was restarted back on her synthroid then but her symptom did not improve a lot. This morning but denies any concern, her speech is pressure and she has a tangential thought. She could not answer my questions appropriately,her family member were not at her bedside at the time I went in to assess her.  Exam: Filed Vitals:   01/24/13 0500 01/24/13 0841 01/24/13 1024 01/24/13 1102  BP: 137/67 148/82  132/57  Pulse: 74 69    Temp:  98.1 F (36.7 C)    TempSrc:  Oral    Resp: 19 18    Height:  5' (1.524 m)    Weight:  102 lb 1.2 oz (46.3 kg)    SpO2: 97% 95% 93%    Gen: Awake and alert, not in distress. HEENT: EOMI, PERRLA. Neuro: Conscious but with mild confusion. CN grossly intact. DTR++,no sensory deficit. Gait steady. Heart: S1 S2 normal,no murmurs. Resp: Air entry equal B/l. Abd; benign. Ext; no edema.  A/P: 67 Y/O F with Sever hypothyroidism and AMS  1. Severe hypothyroidism:     Restart on Synthroid , might need a higher dose,this can be adjusted by PCP after repeat TSH in few weeks.     I agree with baseline ECHO  As hypothyroidism is a risk factor for progressive heart disease.     Monitor on telemetry.  2. AMS: Likely related to her poorly controlled hypothyroidism vs Psychiatric condition.     Synthroid restarted.     I agree with psych consult.     I will also recommend dementia work.     FMTS morning team to discuss  HIV testing with patient,obtain RPR,Vit B12 check.     CT head reviewed with no acute abnormality.     MMSE recommended.  3. HTN: Continue home med.  For other chronic condition, continue home medication.

## 2013-01-24 NOTE — Progress Notes (Signed)
I went performed a MMSE on Cheryl [name]. She scored a 29/30 which indicates a normal cognition. The only mistake she had was the year. She said 2013 instead of 2014.   Clare Gandy, MD  PGY-1

## 2013-01-24 NOTE — ED Provider Notes (Signed)
CSN: [id number]     Arrival date & time 01/23/13  2351 History     First MD Initiated Contact with Patient 01/24/13 0404     Chief Complaint  Patient presents with  . Shortness of Breath   (Consider location/radiation/quality/duration/timing/severity/associated sxs/prior Treatment) HPI 67 yo female presents to the ER from home with family with shortness of breath, confusion, abnormal behavior.  Pt was seen yesterday at [facility] and [facility] for similar sxs.  Pt has history of asthma/COPD, hypothyroidism, deafness.  Family report she was at her baseline about a month ago, able to manage her medications, normal mental status. They deny h/o psych issues.  Pt initially went to [facility] yesterday due to wanting to have a full physical.  She does not have a primary care doctor, sees Dr Lucianne Muss with endocrinology and [facility].  Pt stopped taking her synthroid about a month ago due to running out.  Since that time, family reports she has stopped sleeping, eating, has had pressured speech and is difficult to manage.  She refilled the prescription yesterday, but no improvement as of yet.  Pt unable to give any sort of clear history due to confusion, pressured speech, and tangential thinking.  Pt had CT scan and labwork yesterday at [facility], dx with UTI and given rx for trazadone for sleep.  She is refusing cipro, but did take a dose around 9 pm.  No fevers.  She had thyroid panel sent while at [place], showing TSH of 126.    Past Medical History  Diagnosis Date  . Allergic rhinitis   . Asthma   . Emphysema   . Hypertension   . Abnormal heart rhythm   . Partial deafness   . Hyperlipidemia    Past Surgical History  Procedure Laterality Date  . Cholecystectomy    . Back surgery    . Nasal sinus surgery    . Knee surgery    . Tubal ligation    . Ear operations    . 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 Abdominal hysterectomy    . Thyroidectomy      Uncertain date   Family  History  Problem Relation Age of Onset  . Allergies Sister     X2  . Heart disease Mother   . Clotting disorder Mother   . Cancer Mother    History  Substance Use Topics  . Smoking status: Never Smoker   . Smokeless tobacco: Never Used  . Alcohol Use: No   OB History   Grav Para Term Preterm Abortions TAB SAB Ect Mult Living                 Review of Systems  Unable to perform ROS: Mental status change    Allergies  Aspirin and Codeine  Home Medications   Current Outpatient Rx  Name  Route  Sig  Dispense  Refill  . albuterol (PROAIR HFA) 108 (90 BASE) MCG/ACT inhaler      INHALE 2 PUFFS INTO THE LUNGS EVERY 6 HOURS AS NEEDED FOR WHEEZING   8.5 g   5   . amLODipine-benazepril (LOTREL) 5-20 MG per capsule   Oral   Take 1 capsule by mouth every evening.          . brimonidine (ALPHAGAN) 0.2 % ophthalmic solution   Both Eyes   Place 1 drop into both eyes 3 (three) times daily.         [name]  ciprofloxacin (CIPRO) 500 MG tablet   Oral   Take 1 tablet (500 mg total) by mouth 2 (two) times daily. One po bid x 7 days   14 tablet   0   . Fluticasone-Salmeterol (ADVAIR DISKUS) 500-50 MCG/DOSE AEPB   Inhalation   Inhale 1 puff into the lungs 2 (two) times daily.   60 each   5   . hydrochlorothiazide (HYDRODIURIL) 12.5 MG tablet   Oral   Take 12.5 mg by mouth daily.          [name] levothyroxine (SYNTHROID, LEVOTHROID) 112 MCG tablet   Oral   Take 112 mcg by mouth daily before breakfast.         . Loperamide-Simethicone (IMODIUM ADVANCED) 2-125 MG CHEW   Oral   Chew by mouth as needed.          . traZODone (DESYREL) 50 MG tablet   Oral   Take 25 mg by mouth at bedtime.          BP 137/67  Pulse 74  Temp(Src) 97.7 F (36.5 C) (Oral)  Resp 19  SpO2 97% Physical Exam  Constitutional:  Thin female complaining of thirst, dry mucous membranes.  Pt is agitated, difficult to re-direct, tangential thinking, pressured speech.  HENT:  Head: Normocephalic  and atraumatic.  Right Ear: External ear normal.  Left Ear: External ear normal.  Mouth/Throat: No oropharyngeal exudate.  Eyes: Conjunctivae and EOM are normal. Pupils are equal, round, and reactive to light.  Neck: Normal range of motion. Neck supple. No JVD present. No tracheal deviation present. No thyromegaly present.  Cardiovascular: Normal rate, regular rhythm, normal heart sounds and intact distal pulses.  Exam reveals no gallop and no friction rub.   No murmur heard. Pulmonary/Chest: Effort normal. No stridor. No respiratory distress. She has wheezes. She has no rales. She exhibits no tenderness.  Abdominal: Soft. Bowel sounds are normal. She exhibits no distension and no mass. There is no tenderness. There is no rebound and no guarding.  Musculoskeletal: Normal range of motion. She exhibits no edema and no tenderness.  Lymphadenopathy:    She has no cervical adenopathy.  Neurological: She is alert. She exhibits normal muscle tone. Coordination normal.  Skin: Skin is warm and dry. No rash noted. No erythema. No pallor.  Psychiatric:  Pressured speech, confusion, tangential speech    ED Course   Procedures (including critical care time)  Labs Reviewed  BASIC METABOLIC PANEL - Abnormal; Notable for the following:    Glucose, Bld 123 (*)    GFR calc non Af Amer 72 (*)    GFR calc Af Amer 83 (*)    All other components within normal limits  URINALYSIS, ROUTINE W REFLEX MICROSCOPIC - Abnormal; Notable for the following:    APPearance CLOUDY (*)    Hgb urine dipstick TRACE (*)    Protein, ur 100 (*)    Leukocytes, UA MODERATE (*)    All other components within normal limits  URINE MICROSCOPIC-ADD ON - Abnormal; Notable for the following:    Squamous Epithelial / LPF MANY (*)    Bacteria, UA MANY (*)    Casts GRANULAR CAST (*)    All other components within normal limits  URINE CULTURE  CBC WITH DIFFERENTIAL   Dg Chest 2 View  01/24/2013   *RADIOLOGY REPORT*  Clinical  Data: Shortness of breath.  CHEST - 2 VIEW  Comparison: PA and lateral chest 05/11/2012 and 01/22/2013.  Findings: The chest is markedly  hyperexpanded with attenuation of the pulmonary vasculature.  Lungs are clear.  No pneumothorax or pleural effusion.  IMPRESSION: Hyperexpansion compatible with emphysema.  No acute disease.   Original Report Authenticated By: Holley Dexter, M.D.   Dg Chest 2 View  01/22/2013   *RADIOLOGY REPORT*  Clinical Data: Altered mental status.  Emphysema.  Abnormal heart rhythm.  CHEST - 2 VIEW  Comparison: Two-view chest 05/11/2012.  Findings: Mild cardiac enlargement is present.  Diffuse emphysematous changes are again noted.  Mild interstitial prominence has increased slightly, suggesting minimal edema.  There are no significant effusions.  IMPRESSION: Mild cardiomegaly and minimal edema superimposed on emphysema.   Original Report Authenticated By: Marin Roberts, M.D.   Ct Head Wo Contrast  01/22/2013   *RADIOLOGY REPORT*  Clinical Data: Confusion.  Memory loss.  CT HEAD WITHOUT CONTRAST  Technique:  Contiguous axial images were obtained from the base of the skull through the vertex without contrast.  Comparison: 06/27/2005  Findings: There is atrophy and chronic small vessel disease changes. No acute intracranial abnormality.  Specifically, no hemorrhage, hydrocephalus, mass lesion, acute infarction, or significant intracranial injury.  No acute calvarial abnormality.  IMPRESSION: No acute intracranial abnormality.  Atrophy, chronic microvascular disease.   Original Report Authenticated By: Charlett Nose, M.D.   1. Hypothyroidism   2. Altered mental status   3. Dyspnea   4. S/P total thyroidectomy   5. Severe hypothyroidism   6. Unspecified asthma(493.90)   7. Unspecified essential hypertension     MDM  67 yo female with altered mental status, hypothyroidism.  Unclear cause of her AMS, whether due to psych cause or low thyroid.  Pt does not have PCM, family  reports they would like to continue pt's outpt care at [facility].  D/w [facility] resident for admission.  Olivia Mackie, MD 01/24/13 [phone]

## 2013-01-24 NOTE — Consult Note (Signed)
Reason for Consult: mania Referring Physician: Dr. Christin Fudge is an 67 y.o. female.  HPI: patient is seen and chart reviewed and case discussed with the patient family daughter, son, sister and husband who are at bed side. Reportedly patient has been showing great significant emotional disturbance with pressure speech, thought racing, increased energy, irritability, losing of associations and flight of ideations. Patient is a distance of sleep and appetite. Reportedly patient has refused to see primary care doctor who was given a diagnosis of bipolar disorder. Patient does not have family history of mental illness. Patient has no safety concerns he has denies recent onset ideation and psychotic symptoms. Patient family is concerned about her clinical presentation and requested to treat appropriately. Patient family requested primary care physician and also health care power of attorney paperwork to be completed during this hospitalization. Patient family was referred to the case management and social service for the above request. Patient has history of asthma/COPD, hypothyroidism, deafness. patient stopped taking her synthroid about a month ago due to running out.. Reportedly patient stopped taking her Synthroid about a month ago since then she is having manic symptoms which indicated mania.  Mental Status Examination: Patient appeared smaller for his age, low body weight, awake, alert and oriented. Patient stated mood is not good and affect is bright animated and somewhat labile. Patient has pressured speech and tangential thought process, loosening of associations and flight of ideations. Patient has denied suicidal, homicidal ideations, intentions or plans. Patient has no evidence of auditory or visual hallucinations, delusions, and paranoia. Patient has poor insight judgment and impulse control.  Past Medical History  Diagnosis Date  . Allergic rhinitis   . Asthma   . Emphysema   .  Hypertension   . Abnormal heart rhythm   . Partial deafness   . Hyperlipidemia   . Family history of anesthesia complication     TWIN SISTER HAS DELAYED REACTION   . Shortness of breath   . Hypothyroidism     SEVERE   . Cardiomegaly     Past Surgical History  Procedure Laterality Date  . Cholecystectomy    . Back surgery    . Nasal sinus surgery    . Knee surgery    . Tubal ligation    . Ear operations    . 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 Abdominal hysterectomy    . Thyroidectomy      Uncertain date    Family History  Problem Relation Age of Onset  . Allergies Sister     X2  . Heart disease Mother   . Clotting disorder Mother   . Cancer Mother     Social History:  reports that she has never smoked. She has never used smokeless tobacco. She reports that she does not drink alcohol or use illicit drugs.  Allergies:  Allergies  Allergen Reactions  . Aspirin     REACTION: asthma flare  . Codeine     REACTION: ulcer    Medications: I have reviewed the patient's current medications.  Results for orders placed during the hospital encounter of 01/24/13 (from the past 48 hour(s))  CBC WITH DIFFERENTIAL     Status: None   Collection Time    01/24/13  5:00 AM      Result Value Range   WBC 9.5  4.0 - 10.5 K/uL   RBC 4.66  3.87 - 5.11 MIL/uL   Hemoglobin 14.4  12.0 - 15.0 g/dL   HCT 52.8  41.3 - 24.4 %   MCV 89.3  78.0 - 100.0 fL   MCH 30.9  26.0 - 34.0 pg   MCHC 34.6  30.0 - 36.0 g/dL   RDW 01.0  27.2 - 53.6 %   Platelets 249  150 - 400 K/uL   Neutrophils Relative % 71  43 - 77 %   Neutro Abs 6.8  1.7 - 7.7 K/uL   Lymphocytes Relative 20  12 - 46 %   Lymphs Abs 1.9  0.7 - 4.0 K/uL   Monocytes Relative 5  3 - 12 %   Monocytes Absolute 0.5  0.1 - 1.0 K/uL   Eosinophils Relative 4  0 - 5 %   Eosinophils Absolute 0.3  0.0 - 0.7 K/uL   Basophils Relative 0  0 - 1 %   Basophils Absolute 0.0  0.0 - 0.1 K/uL  BASIC METABOLIC PANEL     Status: Abnormal    Collection Time    01/24/13  5:00 AM      Result Value Range   Sodium 140  135 - 145 mEq/L   Potassium 3.7  3.5 - 5.1 mEq/L   Chloride 105  96 - 112 mEq/L   CO2 25  19 - 32 mEq/L   Glucose, Bld 123 (*) 70 - 99 mg/dL   BUN 17  6 - 23 mg/dL   Creatinine, Ser 6.44  0.50 - 1.10 mg/dL   Calcium 03.4  8.4 - 74.2 mg/dL   GFR calc non Af Amer 72 (*) >90 mL/min   GFR calc Af Amer 83 (*) >90 mL/min   Comment:            The eGFR has been calculated     using the CKD EPI equation.     This calculation has not been     validated in all clinical     situations.     eGFR's persistently     <90 mL/min signify     possible Chronic Kidney Disease.  URINALYSIS, ROUTINE W REFLEX MICROSCOPIC     Status: Abnormal   Collection Time    01/24/13  6:02 AM      Result Value Range   Color, Urine YELLOW  YELLOW   APPearance CLOUDY (*) CLEAR   Specific Gravity, Urine 1.017  1.005 - 1.030   pH 5.5  5.0 - 8.0   Glucose, UA NEGATIVE  NEGATIVE mg/dL   Hgb urine dipstick TRACE (*) NEGATIVE   Bilirubin Urine NEGATIVE  NEGATIVE   Ketones, ur NEGATIVE  NEGATIVE mg/dL   Protein, ur 595 (*) NEGATIVE mg/dL   Urobilinogen, UA 0.2  0.0 - 1.0 mg/dL   Nitrite NEGATIVE  NEGATIVE   Leukocytes, UA MODERATE (*) NEGATIVE  URINE MICROSCOPIC-ADD ON     Status: Abnormal   Collection Time    01/24/13  6:02 AM      Result Value Range   Squamous Epithelial / LPF MANY (*) RARE   WBC, UA 11-20  <3 WBC/hpf   RBC / HPF 0-2  <3 RBC/hpf   Bacteria, UA MANY (*) RARE   Casts GRANULAR CAST (*) NEGATIVE   Comment: HYALINE CASTS  CBC     Status: None   Collection Time    01/24/13 11:20 AM      Result Value Range   WBC 9.0  4.0 - 10.5 K/uL   RBC 4.65  3.87 - 5.11 MIL/uL   Hemoglobin  14.2  12.0 - 15.0 g/dL   HCT 40.9  81.1 - 91.4 %   MCV 90.3  78.0 - 100.0 fL   MCH 30.5  26.0 - 34.0 pg   MCHC 33.8  30.0 - 36.0 g/dL   RDW 78.2  95.6 - 21.3 %   Platelets 274  150 - 400 K/uL  CREATININE, SERUM     Status: Abnormal    Collection Time    01/24/13 11:20 AM      Result Value Range   Creatinine, Ser 0.68  0.50 - 1.10 mg/dL   GFR calc non Af Amer 88 (*) >90 mL/min   GFR calc Af Amer >90  >90 mL/min   Comment:            The eGFR has been calculated     using the CKD EPI equation.     This calculation has not been     validated in all clinical     situations.     eGFR's persistently     <90 mL/min signify     possible Chronic Kidney Disease.    Dg Chest 2 View  01/24/2013   *RADIOLOGY REPORT*  Clinical Data: Shortness of breath.  CHEST - 2 VIEW  Comparison: PA and lateral chest 05/11/2012 and 01/22/2013.  Findings: The chest is markedly hyperexpanded with attenuation of the pulmonary vasculature.  Lungs are clear.  No pneumothorax or pleural effusion.  IMPRESSION: Hyperexpansion compatible with emphysema.  No acute disease.   Original Report Authenticated By: Holley Dexter, M.D.    Positive for anxiety, bipolar, mood swings, sleep disturbance and Loosening of associations and flight of ideations and pressured speech Blood pressure 132/57, pulse 69, temperature 98.1 F (36.7 C), temperature source Oral, resp. rate 18, height 5' (1.524 m), weight 46.3 kg (102 lb 1.2 oz), SpO2 93.00%.   Assessment/Plan: Bipolar disorder NOS or secondary to [facility]  Start Abilify 5 mg PO BId Start Remeeron 7.5 mg Qhs Appreciate psychiatric consultation and followup as needed   Nehemiah Settle., M.D. 01/24/2013, 6:11 PM

## 2013-01-25 DIAGNOSIS — Z9889 Other specified postprocedural states: Secondary | ICD-10-CM

## 2013-01-25 DIAGNOSIS — E039 Hypothyroidism, unspecified: Secondary | ICD-10-CM

## 2013-01-25 DIAGNOSIS — J45909 Unspecified asthma, uncomplicated: Secondary | ICD-10-CM

## 2013-01-25 LAB — URINE CULTURE
Colony Count: NO GROWTH
Culture: NO GROWTH

## 2013-01-25 LAB — HEMOGLOBIN A1C
Hgb A1c MFr Bld: 5.9 % — ABNORMAL HIGH (ref <5.7)
Mean Plasma Glucose: 123 mg/dL — ABNORMAL HIGH (ref <117)

## 2013-01-25 MED ORDER — MIRTAZAPINE 7.5 MG PO TABS: 7.5000 mg | ORAL_TABLET | Freq: Every day | ORAL | Status: DC

## 2013-01-25 MED ORDER — ARIPIPRAZOLE 5 MG PO TABS: 5.0000 mg | ORAL_TABLET | Freq: Two times a day (BID) | ORAL | Status: DC

## 2013-01-25 NOTE — Progress Notes (Signed)
Attending Addendum  I examined the patient and discussed the assessment and plan with Dr. Cheron Schaumann. I have reviewed the note and agree.   Patient will need follow up in 1-2 weeks to address compliance with abilify and remeron and mood. She will need repeat TSH check in 6-8 weeks. It will take time to see significant improvement on mood stabilizer, but she is medically stable for discharge.     Dessa Phi, MD FAMILY MEDICINE TEACHING SERVICE

## 2013-01-25 NOTE — Discharge Summary (Signed)
Family Medicine [facility] Discharge Summary  Patient name: Cheryl [name] Medical record number: [id number] Date of birth: March 04, 1946 Age: 67 y.o. Gender: female Date of Admission: 01/24/2013  Date of Discharge: 01/25/2013 Admitting Physician: Janit Pagan, MD  Primary Care Provider: Leanor Rubenstein, MD Consultants: Psychiatry  Indication for Hospitalization: Severe hypothyroidism, Acute Encephalopathy with Manic features  Discharge Diagnoses/Problem List: Severe hypothyroidism, secondary to medical non-compliance Bipolar Disorder (NOS), with mania Hypertension Hyperlipidemia Asthma   Disposition: Home  Discharge Condition: Stable  Brief Hospital Course:  Cheryl [name] is a 66 y.o. year old female presenting with presenting with altered mental status with manic features including pressured speech, tangential thoughts, poor sleep, poor memory and confusion. PMH significant for hypothyroidism (s/p total thyroidectomy), poor hearing, asthma, and HTN. Recent labwork shows profound hypothyroidism with TSH >120. Other labs generally unremarkable; treated for "UTI," but culture grew only 30k CFU/mL of mixed types.  # Severe Hypothyroidism Patient and family reported that she had run out of her levothyroxine for the past 2 weeks to 1 month. However, history regarding the etiology of her hypothyroidism was unclear, reportedly she was s/p a total thyroidectomy (10+ years ago). On presentation her TSH was significantly elevated at 126, normal T4 1.09, and low Free T3 2.1. Other labs and work-up for her altered mental status were negative including: negative CT Head, normal electrolytes, B12, glucose, HIV / RPR. UA was suggestive of UTI for which she was recently treated outpatient. During her hospital stay her thyroid hormone was replaced with levothyroxine , and outpatient follow-up arranged to test her TSH and thyroid function in 6 to 8 weeks.  # Bipolar Disorder, NOS with  mania Patient presented with significant features of mania including pressured speech, flight of ideas, tangential thoughts, poor sleep, poor memory and confusion. Reportedly she had previously been diagnosed with Bipolar Disorder many years ago and was non-compliant with therapy, however we do not have these records. Her altered mental status was not inhibiting her cognitive function as her MMSE was 29/30, however her speech, thought, and behaviors were consistent with mood disorder. Psychiatry was consulted and confirmed the diagnosis of Bipolar Disorder NOS, and started her on Abilify 5mg  PO BID, and Remeron 7.5mg  QHS. She was given prescriptions and scheduled to follow-up with both psychiatry and her PCP.  # Hypertension The patient's blood pressure was well controlled during this hospitalization. She was continued on her home or equivalent medications with no complications.  # Hyperlipidemia Labs collected during this hospitalization included lipid panel showing high total cholesterol 348 with LDL of 242, and high HDL 95. She was not initiated on statin therapy while hospitalized, but consideration for initiating statin therapy would be important to follow-up on.  # Asthma On presentation some dsypnea was present, which was likely attributed to acute agitation. Her oxygen saturation was stable >90% on RA. Initial CXR findings consistent with emphysema and cardiomegaly. She was continued on inhaler medications: Dulera (home Advair), albuterol PRN. There were no further concerns with her respiratory status.  Issues for Follow Up:  Thyroid Function - Will need thyroid function studies performed in 6 to 8 weeks, to determine appropriate dosing and maintenance of levothyroxine  Bipolar Disorder - Outpatient follow-up with PCP and Psychiatry to further detail specific and categorization of patient's mood disorder, and determining response to current therapy and possible need for  alternatives  Medication Non-compliance - Due to patient's history, close follow-up is recommended to ensure patient is compliant with medical therapy, especially  levothyroxine and newly initiated mood stabilization meds  Significant Procedures: none  Significant Labs and Imaging:   Recent Labs Lab 01/22/13 1538 01/24/13 0500 01/24/13 1120  WBC 9.7 9.5 9.0  HGB 14.2 14.4 14.2  HCT 41.3 41.6 42.0  PLT 282 249 274    Recent Labs Lab 01/22/13 1244 01/22/13 1538 01/24/13 0500 01/24/13 1120  NA 140 136 140  --   K 4.2 3.2* 3.7  --   CL 104 102 105  --   CO2 26 24 25   --   GLUCOSE 99 205* 123*  --   BUN 17 15 17   --   CREATININE 0.71 0.63 0.82 0.68  CALCIUM 10.2 10.0 10.1  --   ALKPHOS 111 103  --   --   AST 47* 41*  --   --   ALT 34 30  --   --   ALBUMIN 5.2 4.3  --   --    Lab Results  Component Value Date   TSH 126.204* 01/22/2013   8/5 CT Head w/o contrast IMPRESSION:  No acute intracranial abnormality.  Atrophy, chronic microvascular disease.  8/7 2V CXR IMPRESSION:  Hyperexpansion compatible with emphysema. No acute disease.  8/7 2D ECHO - Left ventricle: The cavity size was normal. Systolic function was normal. The estimated ejection fraction was in the range of 55% to 60%. Wall motion was normal; there were no regional wall motion abnormalities. - Left atrium: The atrium was mildly dilated. - Atrial septum: There was an atrial septal aneurysm. - Pericardium, extracardiac: There was a left pleural effusion.  Outstanding Results: None  Discharge Medications:    Medication List         albuterol 108 (90 BASE) MCG/ACT inhaler  Commonly known as:  PROAIR HFA  INHALE 2 PUFFS INTO THE LUNGS EVERY 6 HOURS AS NEEDED FOR WHEEZING     amLODipine-benazepril 5-20 MG per capsule  Commonly known as:  LOTREL  Take 1 capsule by mouth every evening.     ARIPiprazole 5 MG tablet  Commonly known as:  ABILIFY  Take 1 tablet (5 mg total) by mouth 2 (two) times  daily.     brimonidine 0.2 % ophthalmic solution  Commonly known as:  ALPHAGAN  Place 1 drop into both eyes 3 (three) times daily.     ciprofloxacin 500 MG tablet  Commonly known as:  CIPRO  Take 1 tablet (500 mg total) by mouth 2 (two) times daily. One po bid x 7 days     Fluticasone-Salmeterol 500-50 MCG/DOSE Aepb  Commonly known as:  ADVAIR DISKUS  Inhale 1 puff into the lungs 2 (two) times daily.     hydrochlorothiazide 12.5 MG tablet  Commonly known as:  HYDRODIURIL  Take 12.5 mg by mouth daily.     IMODIUM ADVANCED 2-125 MG Chew  Generic drug:  Loperamide-Simethicone  Chew by mouth as needed.     levothyroxine 112 MCG tablet  Commonly known as:  SYNTHROID, LEVOTHROID  Take 112 mcg by mouth daily before breakfast.     mirtazapine 7.5 MG tablet  Commonly known as:  REMERON  Take 1 tablet (7.5 mg total) by mouth at bedtime.     traZODone 50 MG tablet  Commonly known as:  DESYREL  Take 25 mg by mouth at bedtime.        Discharge Instructions: Please refer to Patient Instructions section of EMR for full details.  Patient was counseled important signs and symptoms that should prompt return to  medical care, changes in medications, dietary instructions, activity restrictions, and follow up appointments.   Follow-Up Appointments:     Follow-up Information   Follow up with [facility]. Schedule an appointment as soon as possible for a visit in 1 week.   Contact information:   [address] [place] [address] [phone]      Follow up with Nehemiah Settle., MD. Schedule an appointment as soon as possible for a visit in 1 week.   Contact information:   [address] [place] [address] [phone]       Saralyn Pilar, DO 01/25/2013, 11:36 PM PGY-1, [facility]

## 2013-01-25 NOTE — Progress Notes (Signed)
Discharged to home with family office visits in place teaching done  

## 2013-01-25 NOTE — Progress Notes (Signed)
Family Medicine Teaching Service Daily Progress Note Intern Pager: [phone]  Patient name: Cheryl [name] Medical record number: [id number] Date of birth: November 29, 1945 Age: 67 y.o. Gender: female  Primary Care Provider: Leanor Rubenstein, MD Consultants: Psych Code Status: Full  Pt Overview and Major Events to Date:  8/6 - treated in ED for UTI - given Cipro 500mg  BID x 7 days - only took 1 day 8/7 - TSH >120, UA +leuks, WBCs  Assessment and Plan:  Cheryl [name] is a 67 y.o. year old female presenting with presenting with very poor memory, pressured speech, poor sleep, confusion. PMH significant for asthma, hypothyroidism (?Hashimoto, ?s/p total thyroidectomy), poor hearing. Recent labwork shows profound hypothyroidism with TSH >120. Other labs generally unremarkable; treated for "UTI," but culture grew only 30k CFU/mL of mixed types.  # Severe Hypothyroidism - uncertain cause, as pt is a poor historian and details are unclear  - possible hx of Hashimoto thyroiditis and/or total thyroidecomy; pt out of Synthroid for one month, recently  - reordered Synthroid, though may need to consider other therapies  - ECHO: LV normal, EF 55-60%, Atrial Septal Aneurysm, L-pleural effusion  # Altered mental status - likely component of hypothyroidism, but some features suggestive of mania/hypomania  -no hx of mental disorder or treatment, to husband's knowledge; chronic microvascular disease on CT  - HIV / RPR - negative - B12 - 526 (negative) - (8/7) MMSE: 29/30 - only missed year (said 2013) - Psych recs:   - Bipolar disorder NOS vs medical condition   - Start Abilify 5mg  PO BID   - Start Remeron 7.5 mg QHS  # Asthma - CXR findings consistent with emphysema and cardiomegaly  -continue Dulera (home Advair), albuterol PRN  # HTN - fluctuating with agitation, generally ~130's-160's/70's-80's  -continue home amlodipine 5, Lotensin 20, HCTZ 12.5  # Hyperlipidemia - LDL >200 at recent ED visit; CT shows  chronic microvascular disease  -will need to consider starting statin therapy  FEN/GI: heart-healthy diet, NS at 75 mL/h  Prophylaxis: subQ heparin  Disposition: Home pending medical improvement  Subjective: O/N - no events Today she is sitting at edge of bed drinking coffee and playing with the IV machine next to her. She exhibits an extreme flight of ideas, constantly shifting topics. She is coherent in what she says, but it is all loosely connected, pressured and difficult to follow. She does state many accurate dates, numbers, and past medical history. She is oriented to date and location  ROS unreliable. However, she did deny any CP, SoB, HA, confusion, visual disturbances, or harmful thoughts.  Objective: Temp:  [98.1 F (36.7 C)-98.3 F (36.8 C)] 98.3 F (36.8 C) (08/08 0514) Pulse Rate:  [57-62] 62 (08/08 0514) Resp:  [18] 18 (08/08 0514) BP: (108-132)/(57-69) 113/69 mmHg (08/08 0514) SpO2:  [93 %-97 %] 97 % (08/08 0514) Weight:  [106 lb 4.2 oz (48.2 kg)] 106 lb 4.2 oz (48.2 kg) (08/08 0514) Physical Exam: General: anxious, pressured speech, NAD Cardiovascular: RRR, no murmur Respiratory: CTAB Abdomen: soft, non-tender, +BS Extremities: moves all, no edema, non-tender  Laboratory:  Recent Labs Lab 01/22/13 1538 01/24/13 0500 01/24/13 1120  WBC 9.7 9.5 9.0  HGB 14.2 14.4 14.2  HCT 41.3 41.6 42.0  PLT 282 249 274    Recent Labs Lab 01/22/13 1244 01/22/13 1538 01/24/13 0500 01/24/13 1120  NA 140 136 140  --   K 4.2 3.2* 3.7  --   CL 104 102 105  --  CO2 26 24 25   --   BUN 17 15 17   --   CREATININE 0.71 0.63 0.82 0.68  CALCIUM 10.2 10.0 10.1  --   PROT 8.5* 7.7  --   --   BILITOT 0.7 0.5  --   --   ALKPHOS 111 103  --   --   ALT 34 30  --   --   AST 47* 41*  --   --   GLUCOSE 99 205* 123*  --      Imaging/Diagnostic Tests:   Cheryl Pilar, DO 01/25/2013, 8:42 AM PGY-1, [facility] FPTS Intern pager: [phone], text  pages welcome

## 2013-01-25 NOTE — Care Management Note (Signed)
    Page 1 of 2   01/25/2013     4:16:44 PM   CARE MANAGEMENT NOTE 01/25/2013  Patient:  Cheryl [name], Cheryl [name]   Account Number:  [id number]  Date Initiated:  01/25/2013  Documentation initiated by:  Cheryl [name]  Subjective/Objective Assessment:   PT ADM ON 01/23/13 WITH AMS, HYPOTHYROIDISM.  PTA, PT RESIDES AT HOME WITH SPOUSE.  SHE HAS 2 SUPPORTIVE CHILDREN.     Action/Plan:   CM REFERRAL FOR HH NEEDS.  MET WITH PT AND SON/DTR IN LAW. REFERRAL TO [facility], PER PT CHOICE.  START OF CARE 24-48H POST DC DATE.  HHRN TO PROVIDE MED COMPLIANCE/MONITORING.   Anticipated DC Date:  01/25/2013   Anticipated DC Plan:  HOME W HOME HEALTH SERVICES      DC Planning Services  CM consult      [facility]  HOME HEALTH   Choice offered to / List presented to:  C-1 Patient        HH arranged  HH-1 RN      [name] agency  [facility]   Status of service:  Completed, signed off Medicare Important Message given?   (If response is "NO", the following Medicare IM given date fields will be blank) Date Medicare IM given:   Date Additional Medicare IM given:    Discharge Disposition:  HOME W HOME HEALTH SERVICES  Per UR Regulation:  Reviewed for med. necessity/level of care/duration of stay  If discussed at Long Length of Stay Meetings, dates discussed:    Comments:  01/25/13 Cheryl [name] [phone] FOR HH CARE:  Cheryl [name] Cheryl [name] FIRST:  CELL [phone]                              SON:  Cheryl [name], CELL [phone]  SON STATES HE WILL GET PT A PILL BOX ASAP TO ASSIST HHRN WITH MED COMPLIANCE.

## 2013-01-28 ENCOUNTER — Telehealth (HOSPITAL_COMMUNITY): Payer: Self-pay

## 2013-01-28 ENCOUNTER — Telehealth (HOSPITAL_COMMUNITY): Payer: Self-pay | Admitting: Licensed Clinical Social Worker

## 2013-01-28 NOTE — Discharge Summary (Signed)
I examined this patient and discussed the care plan with Dr Karamalegos and the FPTS team and agree with assessment and plan as documented in the discharge note above.  

## 2013-01-28 NOTE — Telephone Encounter (Signed)
01/28/13 Recd faxed information over the weekend in reference to the patient from [facility] to Dr. Elsie Saas - the family also called this morning requesting an appt today with Dr. Elsie Saas explained to the family that Dr. Elsie Saas is our in-patient doctor he doesn't see patients in the outpatient clinic - per family the ED doctor told them that the patient needed to see Dr. Rikki Spearing Nettie Elm) had already faxed over the information the the adult unit for the doctor to review. When the family call this morning I gave them 3-referrals for them to call to get the patient in sooner then we could in our office - the family called back stating that they have an appt this Friday but patient still needed to be seen before then - I called and spoke with Dr. Shela Commons tried to explained the situation and also inform the doctor that the information was fax over this morning to the adult unit - Dr. Shela Commons was very abrupt when I was trying to inform him of the situation... Will give this information to Dr. Reina Fuse

## 2013-01-29 ENCOUNTER — Telehealth: Payer: Self-pay

## 2013-01-29 ENCOUNTER — Ambulatory Visit (INDEPENDENT_AMBULATORY_CARE_PROVIDER_SITE_OTHER): Payer: BC Managed Care – PPO | Admitting: Family Medicine

## 2013-01-29 VITALS — BP 108/68 | HR 63 | Temp 98.0°F | Resp 17 | Ht 60.0 in | Wt 104.0 lb

## 2013-01-29 DIAGNOSIS — I10 Essential (primary) hypertension: Secondary | ICD-10-CM

## 2013-01-29 DIAGNOSIS — R0600 Dyspnea, unspecified: Secondary | ICD-10-CM

## 2013-01-29 DIAGNOSIS — J449 Chronic obstructive pulmonary disease, unspecified: Secondary | ICD-10-CM

## 2013-01-29 DIAGNOSIS — J45909 Unspecified asthma, uncomplicated: Secondary | ICD-10-CM

## 2013-01-29 DIAGNOSIS — Z79899 Other long term (current) drug therapy: Secondary | ICD-10-CM

## 2013-01-29 DIAGNOSIS — M25579 Pain in unspecified ankle and joints of unspecified foot: Secondary | ICD-10-CM

## 2013-01-29 DIAGNOSIS — E559 Vitamin D deficiency, unspecified: Secondary | ICD-10-CM

## 2013-01-29 DIAGNOSIS — E785 Hyperlipidemia, unspecified: Secondary | ICD-10-CM

## 2013-01-29 DIAGNOSIS — T148XXA Other injury of unspecified body region, initial encounter: Secondary | ICD-10-CM

## 2013-01-29 DIAGNOSIS — E038 Other specified hypothyroidism: Secondary | ICD-10-CM

## 2013-01-29 DIAGNOSIS — R0609 Other forms of dyspnea: Secondary | ICD-10-CM

## 2013-01-29 LAB — COMPREHENSIVE METABOLIC PANEL
ALT: 26 U/L (ref 0–35)
AST: 33 U/L (ref 0–37)
Albumin: 4.9 g/dL (ref 3.5–5.2)
Alkaline Phosphatase: 104 U/L (ref 39–117)
BUN: 30 mg/dL — ABNORMAL HIGH (ref 6–23)
CO2: 29 mEq/L (ref 19–32)
Calcium: 10.7 mg/dL — ABNORMAL HIGH (ref 8.4–10.5)
Chloride: 100 mEq/L (ref 96–112)
Creat: 1 mg/dL (ref 0.50–1.10)
Glucose, Bld: 117 mg/dL — ABNORMAL HIGH (ref 70–99)
Potassium: 4.3 mEq/L (ref 3.5–5.3)
Sodium: 136 mEq/L (ref 135–145)
Total Bilirubin: 0.4 mg/dL (ref 0.3–1.2)
Total Protein: 8.1 g/dL (ref 6.0–8.3)

## 2013-01-29 MED ORDER — LEVOTHYROXINE SODIUM 112 MCG PO TABS: 112.0000 ug | ORAL_TABLET | Freq: Every day | ORAL | Status: DC

## 2013-01-29 MED ORDER — FLUTICASONE-SALMETEROL 500-50 MCG/DOSE IN AEPB: 1.0000 | INHALATION_SPRAY | Freq: Two times a day (BID) | RESPIRATORY_TRACT | Status: DC

## 2013-01-29 MED ORDER — BRIMONIDINE TARTRATE 0.2 % OP SOLN: 1.0000 [drp] | Freq: Three times a day (TID) | OPHTHALMIC | Status: DC

## 2013-01-29 MED ORDER — ALBUTEROL SULFATE HFA 108 (90 BASE) MCG/ACT IN AERS: INHALATION_SPRAY | RESPIRATORY_TRACT | Status: DC

## 2013-01-29 MED ORDER — AMLODIPINE BESY-BENAZEPRIL HCL 5-20 MG PO CAPS: 1.0000 | ORAL_CAPSULE | Freq: Every evening | ORAL | Status: DC

## 2013-01-29 MED ORDER — HYDROCHLOROTHIAZIDE 12.5 MG PO TABS: 12.5000 mg | ORAL_TABLET | Freq: Every day | ORAL | Status: DC

## 2013-01-29 NOTE — Telephone Encounter (Signed)
Advised her to try Miralax bid and let us know if this is not helpful. To you FYI

## 2013-01-29 NOTE — Patient Instructions (Addendum)
Your psychiatrist with prescribe your aripiprazole, mirtazapine, and trazodone. Take your thyroid pill (levothyroxine or Synthroid) by itself with water 30 minutes before all of your other medications. Come back in 1 month for recheck and more blood work.  We will want you to be FASTING at this visit so nothing to eat or drink for 8 hours before hand other than water but DO take your medications this morning.

## 2013-01-29 NOTE — Progress Notes (Signed)
Subjective:    Patient ID: Cheryl [name], female    DOB: [birth year], 67 y.o.   MRN: [id number] Chief Complaint  Patient presents with  . Medication Refill  . Follow-up    hospital     HPI  Prev was seeing Dr. Wynelle Link at [facility] but didn't answer enough of her questions so now has been coming here and seeing Dr. Alwyn Ren regularly.  Was hopsitalized o/n 5d ago.  She has been taking her synthroid as rx'ed since the hosp.  Has seen Dr. Reather Littler for endocrinology.  Now she is going to f/u with psychiatry - behavioral health - has an appt from at 9 a.m.  Review of Systems  Constitutional: Positive for activity change and fatigue. Negative for fever, chills, diaphoresis, appetite change and unexpected weight change.  HENT: Negative for trouble swallowing.   Cardiovascular: Negative for palpitations.  Gastrointestinal: Negative for diarrhea and constipation.  Genitourinary: Negative for frequency and decreased urine volume.  Musculoskeletal: Positive for arthralgias and gait problem.  Skin: Negative for color change, pallor and rash.  Neurological: Positive for dizziness and light-headedness. Negative for tremors, syncope, weakness and numbness.  Psychiatric/Behavioral: Positive for sleep disturbance, decreased concentration and agitation. The patient is not nervous/anxious.       BP 108/68  Pulse 63  Temp(Src) 98 F (36.7 C) (Oral)  Resp 17  Ht 5' (1.524 m)  Wt 104 lb (47.174 kg)  BMI 20.31 kg/m2  SpO2 94% Objective:   Physical Exam  Constitutional: She is oriented to person, place, and time. She appears well-developed and well-nourished. No distress.  HENT:  Head: Normocephalic and atraumatic.  Right Ear: External ear normal.  Left Ear: External ear normal.  Eyes: Conjunctivae are normal. No scleral icterus.  Neck: Normal range of motion. Neck supple. No thyromegaly present.  Cardiovascular: Normal rate, regular rhythm, normal heart sounds and intact distal pulses.    Pulmonary/Chest: Effort normal and breath sounds normal. No respiratory distress.  Musculoskeletal: She exhibits no edema.  Lymphadenopathy:    She has no cervical adenopathy.  Neurological: She is alert and oriented to person, place, and time.  Skin: Skin is warm and dry. She is not diaphoretic. No erythema.  Psychiatric: Her affect is labile. Her speech is rapid and/or pressured. She is agitated. She expresses impulsivity. She expresses no homicidal and no suicidal ideation. She expresses no suicidal plans and no homicidal plans.          Assessment & Plan:   Dyspnea - Plan: DISCONTINUED: Fluticasone-Salmeterol (ADVAIR DISKUS) 500-50 MCG/DOSE AEPB  COPD (chronic obstructive pulmonary disease) - Plan: DISCONTINUED: Fluticasone-Salmeterol (ADVAIR DISKUS) 500-50 MCG/DOSE AEPB  Encounter for long-term (current) use of other medications - Plan: Comprehensive metabolic panel  Severe hypothyroidism  HYPERTENSION  ASTHMA  Other and unspecified hyperlipidemia  Unspecified vitamin D deficiency - Plan: Vitamin D, 25-hydroxy  Bruising  Pain in joint, ankle and foot, unspecified laterality - Plan: Ambulatory referral to Podiatry  Meds ordered this encounter  Medications  . levothyroxine (SYNTHROID, LEVOTHROID) 112 MCG tablet    Sig: Take 1 tablet (112 mcg total) by mouth daily before breakfast. BRAND NAME MEDICALLY NECESSARY.    Dispense:  30 tablet    Refill:  3  . hydrochlorothiazide (HYDRODIURIL) 12.5 MG tablet    Sig: Take 1 tablet (12.5 mg total) by mouth daily.    Dispense:  30 tablet    Refill:  3  . Fluticasone-Salmeterol (ADVAIR DISKUS) 500-50 MCG/DOSE AEPB    Sig: Inhale  1 puff into the lungs 2 (two) times daily.    Dispense:  60 each    Refill:  5  . amLODipine-benazepril (LOTREL) 5-20 MG per capsule    Sig: Take 1 capsule by mouth every evening.    Dispense:  30 capsule    Refill:  3  . albuterol (PROAIR HFA) 108 (90 BASE) MCG/ACT inhaler    Sig: INHALE 2  PUFFS INTO THE LUNGS EVERY 6 HOURS AS NEEDED FOR WHEEZING    Dispense:  8.5 g    Refill:  5  . brimonidine (ALPHAGAN) 0.2 % ophthalmic solution    Sig: Place 1 drop into both eyes 3 (three) times daily.    Dispense:  5 mL    Refill:  0     [name], MD MPH

## 2013-01-29 NOTE — Telephone Encounter (Signed)
DELBERT STATES HIS WIFE JUST SAW DR Clelia Croft AND FORGOT TO TELL HER THAT SHE IS CONSTIPATED. PLEASE CALL [phone]    [facility] [address]

## 2013-01-30 ENCOUNTER — Ambulatory Visit: Payer: Self-pay | Admitting: Endocrinology

## 2013-01-30 ENCOUNTER — Encounter: Payer: Self-pay | Admitting: Family Medicine

## 2013-01-30 DIAGNOSIS — Z0289 Encounter for other administrative examinations: Secondary | ICD-10-CM

## 2013-01-30 NOTE — Telephone Encounter (Signed)
Yes, if not helpful, she needs to come in. Thanks.

## 2013-02-01 ENCOUNTER — Telehealth (HOSPITAL_COMMUNITY): Payer: Self-pay

## 2013-02-01 ENCOUNTER — Encounter (HOSPITAL_COMMUNITY): Payer: Self-pay | Admitting: Psychiatry

## 2013-02-01 ENCOUNTER — Ambulatory Visit (INDEPENDENT_AMBULATORY_CARE_PROVIDER_SITE_OTHER): Payer: BC Managed Care – PPO | Admitting: Psychiatry

## 2013-02-01 VITALS — BP 121/54 | HR 66 | Ht 60.0 in | Wt 104.0 lb

## 2013-02-01 DIAGNOSIS — F0630 Mood disorder due to known physiological condition, unspecified: Secondary | ICD-10-CM

## 2013-02-01 NOTE — Progress Notes (Signed)
Psychiatric Assessment Adult  Patient Identification:  Cheryl [name] Date of Evaluation:  02/01/2013 Chief Complaint:    Chief Complaint  Patient presents with  . Depression  . Anxiety   History of Chief Complaint:  HPI Comments: Ms. Piazza is  a 67 y/o female with a past psychiatric history significant for Schizoaffective Disorder. The patient is referred for psychiatric services for psychiatric evaluation and medication management.   The patient reports that her main stressors are:  "thyroid Issues"-The patient had stopped taking her thyroid medications and became severely hypothyroid and was admitted to the ER.  "husband cancer"-was diagnosed with bladder cancer but is now doing okay.  "daughter cancer"Last years but is okay now.   Elements: Outpatient Quality: NO further suicidal ideation. Stressors including  Severity: Severe Timing: Daily and constant Duration: 1 years/months, but became worse over the past 3 weeks leading to ED admission. Context: Family issues   Her family reports that their PCP diagnosed her with Bipolar I Disorder.  She has not had a formal evaluation by a psychiatrist.   Jeanene Erb patient's son [phone]) who reports her has POA.  The patient son st Review of Systems  Constitutional: Positive for chills, activity change, appetite change and fatigue. Negative for fever.  Respiratory: Negative for apnea, cough, shortness of breath and wheezing.   Cardiovascular: Negative for chest pain, palpitations and leg swelling.  Gastrointestinal: Negative for nausea, vomiting, abdominal pain, diarrhea and constipation.  Endocrine: Positive for cold intolerance. Negative for heat intolerance, polydipsia, polyphagia and polyuria.  Neurological: Positive for dizziness. Negative for syncope and numbness.    Filed Vitals:   02/01/13 0937  BP: 121/54  Pulse: 66  Height: 5' (1.524 m)  Weight: 104 lb (47.174 kg)    Physical Exam  Vitals  reviewed. Constitutional: She appears well-developed and well-nourished. No distress.  Skin: She is not diaphoretic.  Musculoskeletal: Strength & Muscle Tone: within normal limits Gait & Station: unsteady Patient leans: Front   Depressive Symptoms: depressed mood, disturbed sleep, increased appetite,  (Hypo) Manic Symptoms:   Elevated Mood:  Yes- when she doesn't take her thyroid medications problems Irritable Mood:  Yes Grandiosity:  No Distractibility:  No Labiality of Mood:  Yes Delusions:  No Hallucinations:  No Impulsivity:  No Sexually Inappropriate Behavior:  No Financial Extravagance:  No Flight of Ideas:  No  Anxiety Symptoms: Excessive Worry:  No Panic Symptoms:  No Agoraphobia:  No Obsessive Compulsive: No  Symptoms: None, Specific Phobias:  No Social Anxiety:  No  Psychotic Symptoms:  Hallucinations: No None Delusions:  No Paranoia:  No   Ideas of Reference:  No  PTSD Symptoms: Ever had a traumatic exposure:  No Had a traumatic exposure in the last month:  No Re-experiencing: No None Hypervigilance:  No Hyperarousal: No None Avoidance: No None  Traumatic Brain Injury: No   Past Psychiatric History: Diagnosis: HIstory of Diagnosis of Bipolar I disorder  Hospitalizations: NOne  Outpatient Care: None  Substance Abuse Care: Patient denies  Self-Mutilation: Patient denies  Suicidal Attempts: Patient denies  Violent Behaviors: Patient denies   Past Medical History:   Past Medical History  Diagnosis Date  . Allergic rhinitis   . Asthma   . Emphysema   . Hypertension   . Abnormal heart rhythm   . Partial deafness   . Hyperlipidemia   . Family history of anesthesia complication     TWIN SISTER HAS DELAYED REACTION   . Shortness of breath   . Hypothyroidism  SEVERE   . Cardiomegaly    History of Loss of Consciousness:  No Seizure History:  Yes Cardiac History:  Yes-HTN  Allergies:   Allergies  Allergen Reactions  . Aspirin      REACTION: asthma flare  . Codeine     REACTION: ulcer   Current Medications:  Current Outpatient Prescriptions  Medication Sig Dispense Refill  . albuterol (PROAIR HFA) 108 (90 BASE) MCG/ACT inhaler INHALE 2 PUFFS INTO THE LUNGS EVERY 6 HOURS AS NEEDED FOR WHEEZING  8.5 g  5  . amLODipine-benazepril (LOTREL) 5-20 MG per capsule Take 1 capsule by mouth every evening.  30 capsule  3  . ARIPiprazole (ABILIFY) 5 MG tablet Take 1 tablet (5 mg total) by mouth 2 (two) times daily.  60 tablet  3  . brimonidine (ALPHAGAN) 0.2 % ophthalmic solution Place 1 drop into both eyes 3 (three) times daily.  5 mL  0  . Fluticasone-Salmeterol (ADVAIR DISKUS) 500-50 MCG/DOSE AEPB Inhale 1 puff into the lungs 2 (two) times daily.  60 each  5  . hydrochlorothiazide (HYDRODIURIL) 12.5 MG tablet Take 1 tablet (12.5 mg total) by mouth daily.  30 tablet  3  . levothyroxine (SYNTHROID, LEVOTHROID) 112 MCG tablet Take 1 tablet (112 mcg total) by mouth daily before breakfast. BRAND NAME MEDICALLY NECESSARY.  30 tablet  3  . Loperamide-Simethicone (IMODIUM ADVANCED) 2-125 MG CHEW Chew by mouth as needed.       . mirtazapine (REMERON) 7.5 MG tablet Take 1 tablet (7.5 mg total) by mouth at bedtime.  30 tablet  3  . traZODone (DESYREL) 50 MG tablet Take 25 mg by mouth at bedtime.       No current facility-administered medications for this visit.    Previous Psychotropic Medications:  Medication   Abilify  Mirtazapine  Trazodone   Substance Abuse History in the last 12 months: Caffeine: Coffee xx cups/pots per day. Caffeinated Beverages xx ounces per day. Nicotine: Patient denies.  Alcohol: Patient denies.  Illicit Drugs: Patient denies.   Medical Consequences of Substance Abuse: Patient denies.   Legal Consequences of Substance Abuse: Patient denies.   Family Consequences of Substance Abuse: Patient denies.   Blackouts:  No DT's:  No Withdrawal Symptoms:  Negative None   Social History: Current Place of  Residence: [place], [place] Place of Birth: [place], [place] Family Members: Lives with her husband and 3 dogs. Daughter lives in [place], [place].  Son lives in [place], [place] Marital Status:  Married-42 years Children: 2  Sons: 1  Daughters: 1 Relationships: Patient reports that her family is her main source of emotional support Education:  Corporate treasurer Problems/Performance: Patient had difficulty with math. Religious Beliefs/Practices: Yes-prays History of Abuse: none Occupational Experiences: Programmer, multimedia of a newspaper, Hydrographic surveyor; retired. Military History:  None. Legal History: Patient denies. Hobbies/Interests: Patient enjoys spending time with people.  Politically active.  Family History:   Family History  Problem Relation Age of Onset  . Allergies Sister     X2  . Heart disease Mother   . Clotting disorder Mother   . Cancer Mother     Psychiatric Specialty Exam: Objective:  Appearance: Casual and Well Groomed  Eye Contact::  Fair  Speech:  Clear and Coherent and Normal Rate  Volume:  Normal  Mood:  "Relaxed today." 5/10  (0=Very depressed; 5=Neutral; 10=Very Happy)  Affect:  Constricted  Thought Process:  Coherent, Linear and Logical  Orientation:  Full (Time, Place, and Person)  Thought Content:  WDL  Suicidal Thoughts:  No  Homicidal Thoughts:  No  Judgement:  Fair  Insight:  Good  Psychomotor Activity:  Normal  Akathisia:  No  Memory: 3/3 immediate; 3/3 recent  Handed:  Left  AIMS (if indicated):  As noted  Assets:  Communication Skills Desire for Improvement Financial Resources/Insurance Housing Intimacy Leisure Time Physical Health Resilience Social Support Talents/Skills Transportation    Laboratory/X-Ray Psychological Evaluation(s)   None  NOne   Assessment:   AXIS I Mood Disorder due to a general medical condition-(Hypothyroidism and Hyperglycemia); Rule out Bipolar Disorder, NEC  AXIS II No diagnosis  AXIS III Past Medical History   Diagnosis Date  . Allergic rhinitis   . Asthma   . Emphysema   . Hypertension   . Abnormal heart rhythm   . Partial deafness   . Hyperlipidemia   . Family history of anesthesia complication     TWIN SISTER HAS DELAYED REACTION   . Shortness of breath   . Hypothyroidism     SEVERE   . Cardiomegaly      AXIS IV other psychosocial or environmental problems  AXIS V 41-50 serious symptoms   Treatment Plan/Recommendations:   Plan of Care:  PLAN:  1. Affirm with the patient that the medications are taken as ordered. Patient  expressed understanding of how their medications were to be used.    Laboratory:  TSH No labs warranted at this time.   Psychotherapy: Therapy: brief supportive therapy provided.  Discussed psychosocial stressors in detail.  Will refer to individual therapy. Continue individual therapy.  Medications:  Start the following psychiatric medications as written prior to this appointment:  a) Will continue Aripiprazole 5 mg-question of whether mood symptoms are due to mood disorder versus solely medical causes-hyperglycemia and hypothyroidism, however, by history, she appeared manic. b) Discontinue Mirtazapine-due to dizziness. c) Discontinue Trazodone-due to dizziness -Risks and benefits, side effects and alternatives discussed with patient, she was given an opportunity to ask questions about his/her medication, illness, and treatment. All current psychiatric medications have been reviewed and discussed with the patient and adjusted as clinically appropriate. The patient has been provided an accurate and updated list of the medications being now prescribed.   Routine PRN Medications:  Negative  Consultations: The patient was encouraged to keep all PCP and specialty clinic appointments.   Safety Concerns:   Patient told to call clinic if any problems occur. Patient advised to go to  ER  if she should develop SI/HI, side effects, or if symptoms worsen. Has crisis  numbers to call if needed.    Other:   8. Patient was instructed to return to clinic in 1 months.  9. The patient was advised to call and cancel their mental health appointment within 24 hours of the appointment, if they are unable to keep the appointment, as well as the three no show and termination from clinic policy. 10. The patient expressed understanding of the plan and agrees with the above.   Jacqulyn Cane, MD 8/15/20149:08 AM

## 2013-02-01 NOTE — Telephone Encounter (Signed)
Called patients son back °

## 2013-02-05 DIAGNOSIS — F0630 Mood disorder due to known physiological condition, unspecified: Secondary | ICD-10-CM | POA: Insufficient documentation

## 2013-02-07 ENCOUNTER — Encounter (HOSPITAL_COMMUNITY): Payer: Self-pay | Admitting: Psychiatry

## 2013-02-07 ENCOUNTER — Telehealth (HOSPITAL_COMMUNITY): Payer: Self-pay

## 2013-02-07 ENCOUNTER — Ambulatory Visit (INDEPENDENT_AMBULATORY_CARE_PROVIDER_SITE_OTHER): Payer: BC Managed Care – PPO | Admitting: Psychiatry

## 2013-02-07 VITALS — BP 125/62 | HR 71 | Ht 60.0 in | Wt 106.0 lb

## 2013-02-07 DIAGNOSIS — F0630 Mood disorder due to known physiological condition, unspecified: Secondary | ICD-10-CM

## 2013-02-07 DIAGNOSIS — G3184 Mild cognitive impairment, so stated: Secondary | ICD-10-CM

## 2013-02-07 NOTE — Telephone Encounter (Signed)
First Attempt to call.  Left message stating I would call back tomorrow.

## 2013-02-07 NOTE — Telephone Encounter (Signed)
CALLING TO DISCUSS ABOUT APPT TODAY. PHONE: [phone]

## 2013-02-07 NOTE — Progress Notes (Signed)
[facility] Follow-up Outpatient Visit   Patient Identification:  Cheryl [name] Date of Evaluation:  02/07/2013 Chief Complaint:    Chief Complaint  Patient presents with  . Follow-up   History of Chief Complaint:  HPI Comments: Ms. 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 The patient is referred for psychiatric services for  medication management.   Elements: Outpatient Quality: No suicidal ideation.No manic behavior, no irritability Severity: Mild Timing: NO current symptoms Duration: Symptoms have resolved. See review of symptoms below.  Context: Medications realted issues. Patient reports some difficulty with memory.   The patient reports her dizziness has been improving with the discontinuation of both mirtazapine and trazodone.  The patient and her husband denies any adverse effects from the discontinuation of either medication. Collateral information from the patient's husband reveal no worsening of the patient's mood in terms of irritability, or other symptoms of mania. Called patient's son [phone]) to discuss current plan with the patient.   Review of Systems  Constitutional: Positive for chills, activity change, appetite change and fatigue. Negative for fever.  Respiratory: Negative for apnea, cough, shortness of breath and wheezing.   Cardiovascular: Negative for chest pain, palpitations and leg swelling.  Gastrointestinal: Negative for nausea, vomiting, abdominal pain, diarrhea and constipation.  Endocrine: Positive for cold intolerance. Negative for heat intolerance, polydipsia, polyphagia and polyuria.  Neurological: Positive for dizziness. Negative for syncope and numbness.    Filed Vitals:   02/07/13 1309  BP: 125/62  Pulse: 71  Height: 5' (1.524 m)  Weight: 106 lb (48.081 kg)   Physical Exam  Vitals reviewed. Constitutional: She appears well-developed  and well-nourished. No distress.  Skin: She is not diaphoretic.  Musculoskeletal: Strength & Muscle Tone: within normal limits Gait & Station: unsteady Patient leans: Front   Depressive Symptoms: depressed mood, disturbed sleep, increased appetite,  (Hypo) Manic Symptoms:   Elevated Mood:  Negative Irritable Mood:  Negative Grandiosity:  No Distractibility:  No Labiality of Mood:  Negative Delusions:  No Hallucinations:  No Impulsivity:  No Sexually Inappropriate Behavior:  No Financial Extravagance:  No Flight of Ideas:  No  Anxiety Symptoms: Excessive Worry:  No Panic Symptoms:  No Agoraphobia:  No Obsessive Compulsive: No  Symptoms: None, Specific Phobias:  No Social Anxiety:  No  Psychotic Symptoms:  Hallucinations: No None Delusions:  No Paranoia:  No   Ideas of Reference:  No  PTSD Symptoms: Ever had a traumatic exposure:  No Had a traumatic exposure in the last month:  No Re-experiencing: No None Hypervigilance:  No Hyperarousal: No None Avoidance: No None  Traumatic Brain Injury: No   Past Psychiatric History: Diagnosis: HIstory of Diagnosis of Bipolar I disorder  Hospitalizations: NOne  Outpatient Care: None  Substance Abuse Care: Patient denies  Self-Mutilation: Patient denies  Suicidal Attempts: Patient denies  Violent Behaviors: Patient denies   Past Medical History:   Past Medical History  Diagnosis Date  . Allergic rhinitis   . Asthma   . Emphysema   . Hypertension   . Abnormal heart rhythm   . Partial deafness   . Hyperlipidemia   . Family history of anesthesia complication     TWIN SISTER HAS DELAYED REACTION   . Shortness of breath   . Cardiomegaly   . Hypothyroidism     SEVERE    History of Loss of Consciousness:  No Seizure History:  Yes Cardiac History:  Yes-HTN  Allergies:   Allergies  Allergen Reactions  . Aspirin     REACTION: asthma flare  . Codeine     REACTION: ulcer   Current Medications:  Current  Outpatient Prescriptions  Medication Sig Dispense Refill  . albuterol (PROAIR HFA) 108 (90 BASE) MCG/ACT inhaler INHALE 2 PUFFS INTO THE LUNGS EVERY 6 HOURS AS NEEDED FOR WHEEZING  8.5 g  5  . amLODipine-benazepril (LOTREL) 5-20 MG per capsule Take 1 capsule by mouth every evening.  30 capsule  3  . ARIPiprazole (ABILIFY) 5 MG tablet Take 1 tablet (5 mg total) by mouth 2 (two) times daily.  60 tablet  3  . brimonidine (ALPHAGAN) 0.2 % ophthalmic solution Place 1 drop into both eyes 3 (three) times daily.  5 mL  0  . Fluticasone-Salmeterol (ADVAIR DISKUS) 500-50 MCG/DOSE AEPB Inhale 1 puff into the lungs 2 (two) times daily.  60 each  5  . hydrochlorothiazide (HYDRODIURIL) 12.5 MG tablet Take 1 tablet (12.5 mg total) by mouth daily.  30 tablet  3  . levothyroxine (SYNTHROID, LEVOTHROID) 112 MCG tablet Take 1 tablet (112 mcg total) by mouth daily before breakfast. BRAND NAME MEDICALLY NECESSARY.  30 tablet  3  . Loperamide-Simethicone (IMODIUM ADVANCED) 2-125 MG CHEW Chew by mouth as needed.        No current facility-administered medications for this visit.    Previous Psychotropic Medications:  Medication   Abilify  Mirtazapine  Trazodone   Substance Abuse History in the last 12 months: Nicotine: Patient denies.  Alcohol: Patient denies.  Illicit Drugs: Patient denies.   Medical Consequences of Substance Abuse: Patient denies.   Legal Consequences of Substance Abuse: Patient denies.   Family Consequences of Substance Abuse: Patient denies.   Blackouts:  No DT's:  No Withdrawal Symptoms:  Negative None   Social History: Current Place of Residence: [place], [place] Place of Birth: [place], [place] Family Members: Lives with her husband and 3 dogs. Daughter lives in [place], [place].  Son lives in [place], [place] Marital Status:  Married-42 years Children: 2  Sons: 1  Daughters: 1 Relationships: Patient reports that her family is her main source of emotional support Education:   Corporate treasurer Problems/Performance: Patient had difficulty with math. Religious Beliefs/Practices: Yes-prays History of Abuse: none Occupational Experiences: Programmer, multimedia of a newspaper, Hydrographic surveyor; retired. Military History:  None. Legal History: Patient denies. Hobbies/Interests: Patient enjoys spending time with people.  Politically active.  Family History:   Family History  Problem Relation Age of Onset  . Allergies Sister     X2  . Heart disease Mother   . Clotting disorder Mother   . Cancer Mother     Psychiatric Specialty Exam: Objective:  Appearance: Casual and Well Groomed  Eye Contact::  Fair  Speech:  Clear and Coherent and Normal Rate  Volume:  Normal  Mood:  "okay." 5/10  (0=Very depressed; 5=Neutral; 10=Very Happy)  Affect:  Constricted  Thought Process:  Coherent, Linear and Logical  Orientation:  Full (Time, Place, and Person)  Thought Content:  WDL  Suicidal Thoughts:  No  Homicidal Thoughts:  No  Judgement:  Fair  Insight:  Good  Psychomotor Activity:  Normal  Akathisia:  No  Memory: 3/3 immediate; 3/3 recent  Handed:  Left  AIMS (if indicated):  As noted  Assets:  Communication Skills Desire for Improvement Financial Resources/Insurance Housing Intimacy Leisure Time Physical Health Resilience Social Support Talents/Skills Transportation    Laboratory/X-Ray Psychological Evaluation(s)   None  [facility]  SLUMS=21 MCND   Assessment:   AXIS I Mood Disorder due to a general medical condition-(Hypothyroidism and Hyperglycemia); Rule out Bipolar Disorder, NEC  AXIS II No diagnosis  AXIS III Past Medical History  Diagnosis Date  . Allergic rhinitis   . Asthma   . Emphysema   . Hypertension   . Abnormal heart rhythm   . Partial deafness   . Hyperlipidemia   . Family history of anesthesia complication     TWIN SISTER HAS DELAYED REACTION   . Shortness of breath   . Cardiomegaly   . Hypothyroidism     SEVERE    Mild neurocognitive disorder  due to another medical condition   Treatment Plan/Recommendations:   Plan of Care:  PLAN:  1. Affirm with the patient that the medications are taken as ordered. Patient  expressed understanding of how their medications were to be used.    Laboratory:  TSH  Psychotherapy: Therapy: brief supportive therapy provided.  Discussed psychosocial stressors in detail.  SLUMS exam suggests Mild neurocognitive disorder.  Medications:  Continue the following psychiatric medications as written prior to this appointment:  a) Will continue Aripiprazole 5 mg-question of whether mood symptoms are due to mood disorder versus solely medical causes-hyperglycemia and hypothyroidism, however, by history, she appeared manic. B) Will discontinue trazodone and mirtazapine. -Risks and benefits, side effects and alternatives discussed with patient, she was given an opportunity to ask questions about her medication, illness, and treatment. All current psychiatric medications have been reviewed and discussed with the patient and adjusted as clinically appropriate. The patient has been provided an accurate and updated list of the medications being now prescribed.   Routine PRN Medications:  Negative  Consultations: The patient was encouraged to keep all PCP and specialty clinic appointments.   Safety Concerns:   Patient told to call clinic if any problems occur. Patient advised to go to  ER  if she should develop SI/HI, side effects, or if symptoms worsen. Has crisis numbers to call if needed.    Other:   8. Patient was instructed to return to clinic in 2 weeks.  9. The patient was advised to call and cancel their mental health appointment within 24 hours of the appointment, if they are unable to keep the appointment, as well as the three no show and termination from clinic policy. 10. The patient expressed understanding of the plan and agrees with the above.   Jacqulyn Cane, MD 8/21/20141:00 PM

## 2013-02-08 ENCOUNTER — Telehealth (HOSPITAL_COMMUNITY): Payer: Self-pay

## 2013-02-08 NOTE — Telephone Encounter (Signed)
First attempt to call patient. Left Message stating I would call back.

## 2013-02-08 NOTE — Telephone Encounter (Addendum)
The patient's son has asked about 24 hours. He is concerned about the patient's fall risk and need for home health . Asked him to talk to the patient's PCP about this for evaluation for need for home health.

## 2013-02-08 NOTE — Telephone Encounter (Signed)
Needs to speak to you again

## 2013-02-15 ENCOUNTER — Encounter: Payer: Self-pay | Admitting: Family Medicine

## 2013-02-15 ENCOUNTER — Ambulatory Visit (INDEPENDENT_AMBULATORY_CARE_PROVIDER_SITE_OTHER): Payer: BC Managed Care – PPO | Admitting: Family Medicine

## 2013-02-15 VITALS — BP 120/68 | HR 59 | Temp 98.8°F | Resp 16 | Ht 59.5 in | Wt 107.0 lb

## 2013-02-15 DIAGNOSIS — E785 Hyperlipidemia, unspecified: Secondary | ICD-10-CM

## 2013-02-15 DIAGNOSIS — Z23 Encounter for immunization: Secondary | ICD-10-CM

## 2013-02-15 DIAGNOSIS — E038 Other specified hypothyroidism: Secondary | ICD-10-CM

## 2013-02-15 DIAGNOSIS — F0630 Mood disorder due to known physiological condition, unspecified: Secondary | ICD-10-CM

## 2013-02-15 DIAGNOSIS — I10 Essential (primary) hypertension: Secondary | ICD-10-CM

## 2013-02-15 DIAGNOSIS — R7309 Other abnormal glucose: Secondary | ICD-10-CM

## 2013-02-15 DIAGNOSIS — M8580 Other specified disorders of bone density and structure, unspecified site: Secondary | ICD-10-CM | POA: Insufficient documentation

## 2013-02-15 DIAGNOSIS — M899 Disorder of bone, unspecified: Secondary | ICD-10-CM

## 2013-02-15 NOTE — Patient Instructions (Addendum)
You need to call your opthalmologist to get your eye drops prescribed for your glaucoma adjusted or changed as necessary. Bring all of your medications to every visit so we can make sure you have plenty of refills. You should not be seeing any other doctors other than your psychiatrist, your eye doctor, and your dentist. If you ever see any other provider, please let me know first. We will do your FASTING blood work at your next visit in 2 weeks - mainly checking on your thyroid and cholesterol. We will also recheck on your kidneys and liver at that time though they seem to be doing great as well. Your pre-diabetes, vitamin D, and vitamin B12 are all doing great. Stop your hctz - your BP has been low normal and could be contributing to feelings of lightheadedness and dizziness. When we recheck you in 2 wks we can decide whether you can stay off of this.

## 2013-02-15 NOTE — Progress Notes (Signed)
Subjective:    Patient ID: Cheryl [name], female    DOB: [birth year], 67 y.o.   MRN: [id number] Chief Complaint  Patient presents with  . Fall    assessment    HPI   Some of the medications she was on was making her dizzy but she is better now since some of the psych meds were stopped - she is no longer needing a cane or to walk or to hold on to anyone.   Her son is her [facility] POA - he is a paramedic in [place] and is concerned about her fall risk so sched this appt today.  She did accidentally fall out of bed once but was not injured and was able to get up just fine.  Other than that, does not recall any falls in the past yr, her husband concurs. She has always had trouble with her balance.  Had advance home care nurse/PT for several visits in the past few wks since her hosp.  The PT dismissed her - told her she was doing great.  Told her her home and bath was set up well.  Reports she was diagnosed in pre-DM in the past.  Past Medical History  Diagnosis Date  . Allergic rhinitis   . Asthma   . Emphysema   . Hypertension   . Abnormal heart rhythm   . Partial deafness   . Hyperlipidemia   . Family history of anesthesia complication     TWIN SISTER HAS DELAYED REACTION   . Shortness of breath   . Cardiomegaly   . Hypothyroidism     SEVERE    Current Outpatient Prescriptions on File Prior to Visit  Medication Sig Dispense Refill  . albuterol (PROAIR HFA) 108 (90 BASE) MCG/ACT inhaler INHALE 2 PUFFS INTO THE LUNGS EVERY 6 HOURS AS NEEDED FOR WHEEZING  8.5 g  5  . amLODipine-benazepril (LOTREL) 5-20 MG per capsule Take 1 capsule by mouth every evening.  30 capsule  3  . ARIPiprazole (ABILIFY) 5 MG tablet Take 1 tablet (5 mg total) by mouth 2 (two) times daily.  60 tablet  3  . brimonidine (ALPHAGAN) 0.2 % ophthalmic solution Place 1 drop into both eyes 3 (three) times daily.  5 mL  0  . Fluticasone-Salmeterol (ADVAIR DISKUS) 500-50 MCG/DOSE AEPB Inhale 1 puff into the lungs 2 (two)  times daily.  60 each  5  . levothyroxine (SYNTHROID, LEVOTHROID) 112 MCG tablet Take 1 tablet (112 mcg total) by mouth daily before breakfast. BRAND NAME MEDICALLY NECESSARY.  30 tablet  3  . Loperamide-Simethicone (IMODIUM ADVANCED) 2-125 MG CHEW Chew by mouth as needed.        No current facility-administered medications on file prior to visit.   Allergies  Allergen Reactions  . Aspirin     REACTION: asthma flare  . Codeine     REACTION: ulcer     Review of Systems  Constitutional: Negative for fever, chills, diaphoresis, activity change, appetite change, fatigue and unexpected weight change.  Eyes: Negative for visual disturbance.  Respiratory: Negative for cough and shortness of breath.   Cardiovascular: Negative for chest pain, palpitations and leg swelling.  Genitourinary: Negative for decreased urine volume.  Neurological: Positive for dizziness, weakness and light-headedness. Negative for tremors, syncope, numbness and headaches.  Hematological: Does not bruise/bleed easily.      BP 120/68  Pulse 59  Temp(Src) 98.8 F (37.1 C) (Oral)  Resp 16  Ht 4' 11.5" (1.511 m)  Wt 107  lb (48.535 kg)  BMI 21.26 kg/m2  SpO2 97% Objective:   Physical Exam  Constitutional: She is oriented to person, place, and time. She appears well-developed and well-nourished. No distress.  HENT:  Head: Normocephalic and atraumatic.  Right Ear: External ear normal.  Eyes: Conjunctivae are normal. No scleral icterus.  Pulmonary/Chest: Effort normal.  Musculoskeletal:  Pt unable to stand up from a chair without using her hands but with hands get-up-and-go test <10 secs.  Neurological: She is alert and oriented to person, place, and time. She exhibits normal muscle tone. She displays a negative Romberg sign. Coordination and gait normal.  Strength 4+/5 diffusely. Heal-to-toe gait with slight instability. Can balance on 1 leg for over 3 seconds.  Skin: Skin is warm and dry. She is not  diaphoretic. No erythema.  Psychiatric: She has a normal mood and affect. Her behavior is normal.      Assessment & Plan:  Need for prophylactic vaccination and inoculation against influenza - Plan: Flu Vaccine QUAD 36+ mos IM  Other abnormal glucose - preDM very mild - hgba1c 5.8, watchful waiting for now.  HYPERTENSION - recheck cmp at f/u, bp has been low nml so d/c hctz 12.5 as this may be contributing to dizziness or imbalance.  Other and unspecified hyperlipidemia - recheck flp at f/u (after thyroid replacement is correct, then consider whether pt needs additional lipid treatment).  Mood disorder in conditions classified elsewhere - possibly due to severe hypothyroidism - pt's mental status is markedly improved today from her prior visit. Cont on Abilify for now and f/u with psychiatry.  Severe hypothyroidism - recheck tsh at f/u in 2 wks (so will be 5 wks since restarting on levothyroxine at that point.)  H/o vitamin D def - resolved. On otc supp.  Glaucoma - Needs f/u with optho  Dental - ok to start on amoxicillin if recommended by dentist.  Had echo on 01/24/13 and does not require antibiotic prophylaxis for dental work  Fall risk - appropriate for age - can consider PT if deconditioning worsens at all but for now pt seems to be doing well.  Osteopenia - will be due to repeat DEXA - last was 2 yrs ago w/ Tscore -2.3, refer at f/u.  Pt's HC POA papers are scanned in and pt would like [place] to communicate with the [facility] POA at every visit - her [facility] POA is her son Jatasia Gundrum [phone] and an alternate is his wife Irving Burton [phone]. If they can't be reached, we can try Ms. Dullea daughter Cristino Martes at [phone] or [phone]. They are also very concerned that in the past Ms. Brim would visit various physicians and was actually taking 3 of the SAME MEDICATION all prescribed by different people - therefore request that she only see me or a few other doctors I refer her  to.

## 2013-02-19 ENCOUNTER — Other Ambulatory Visit: Payer: Self-pay | Admitting: Pulmonary Disease

## 2013-02-20 ENCOUNTER — Encounter (HOSPITAL_COMMUNITY): Payer: Self-pay | Admitting: Psychiatry

## 2013-02-20 ENCOUNTER — Ambulatory Visit (INDEPENDENT_AMBULATORY_CARE_PROVIDER_SITE_OTHER): Payer: BC Managed Care – PPO | Admitting: Psychiatry

## 2013-02-20 VITALS — BP 108/53 | HR 51 | Ht 59.0 in | Wt 110.0 lb

## 2013-02-20 DIAGNOSIS — G3184 Mild cognitive impairment, so stated: Secondary | ICD-10-CM

## 2013-02-20 DIAGNOSIS — F0630 Mood disorder due to known physiological condition, unspecified: Secondary | ICD-10-CM

## 2013-02-20 NOTE — Progress Notes (Signed)
[facility] Follow-up Outpatient Visit   Patient Identification:  Cheryl [name] Date of Evaluation:  02/20/2013 Chief Complaint:    Chief Complaint  Patient presents with  . 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 The patient is referred for psychiatric services for  medication management.   Elements: Outpatient Quality: No suicidal ideation.No manic behavior, no irritability Severity: Mood symptoms -Negative, MNCD-moderate Timing: NO current symptoms Duration: Symptoms have resolved. See review of symptoms below.  Context: Medications realted issues. Patient reports some difficulty with memory.   The patient reports she is doing well. She denies any current problems.  Again called patient's son [phone]) to discuss current plan with the patient.    Review of Systems  Constitutional: Positive for chills, activity change, appetite change and fatigue. Negative for fever.  Respiratory: Negative for apnea, cough, shortness of breath and wheezing.   Cardiovascular: Negative for chest pain, palpitations and leg swelling.  Gastrointestinal: Negative for nausea, vomiting, abdominal pain, diarrhea and constipation.  Endocrine: Positive for cold intolerance. Negative for heat intolerance, polydipsia, polyphagia and polyuria.  Neurological: Positive for dizziness. Negative for syncope and numbness.    Filed Vitals:   02/20/13 1307  BP: 108/53  Pulse: 51  Height: 4\' 11"  (1.499 m)  Weight: 110 lb (49.896 kg)   Physical Exam  Vitals reviewed. Constitutional: She appears well-developed and well-nourished. No distress.  Skin: She is not diaphoretic.  Musculoskeletal: Strength & Muscle Tone: within normal limits Gait & Station: unsteady Patient leans: Front   Depressive Symptoms: depressed mood, disturbed  sleep, increased appetite,  (Hypo) Manic Symptoms:   Elevated Mood:  Negative Irritable Mood:  Negative Grandiosity:  No Distractibility:  No Labiality of Mood:  Negative Delusions:  No Hallucinations:  No Impulsivity:  No Sexually Inappropriate Behavior:  No Financial Extravagance:  No Flight of Ideas:  No  Anxiety Symptoms: Excessive Worry:  No Panic Symptoms:  No Agoraphobia:  No Obsessive Compulsive: No  Symptoms: None, Specific Phobias:  No Social Anxiety:  No  Psychotic Symptoms:  Hallucinations: No None Delusions:  No Paranoia:  No   Ideas of Reference:  No  PTSD Symptoms: Ever had a traumatic exposure:  No Had a traumatic exposure in the last month:  No Re-experiencing: No None Hypervigilance:  No Hyperarousal: No None Avoidance: No None  Traumatic Brain Injury: No   Past Psychiatric History: Diagnosis: HIstory of Diagnosis of Bipolar I disorder  Hospitalizations: NOne  Outpatient Care: None  Substance Abuse Care: Patient denies  Self-Mutilation: Patient denies  Suicidal Attempts: Patient denies  Violent Behaviors: Patient denies   Past Medical History:   Past Medical History  Diagnosis Date  . Allergic rhinitis   . Asthma   . Emphysema   . Hypertension   . Abnormal heart rhythm   . Partial deafness   . Hyperlipidemia   . Family history of anesthesia complication     TWIN SISTER HAS DELAYED REACTION   . Shortness of breath   . Cardiomegaly   . Hypothyroidism     SEVERE    History of Loss of Consciousness:  No Seizure History:  Yes Cardiac History:  Yes-HTN  Allergies:   Allergies  Allergen Reactions  . Aspirin     REACTION: asthma flare  . Codeine     REACTION: ulcer   Current Medications:  Current  Outpatient Prescriptions  Medication Sig Dispense Refill  . albuterol (PROAIR HFA) 108 (90 BASE) MCG/ACT inhaler INHALE 2 PUFFS INTO THE LUNGS EVERY 6 HOURS AS NEEDED FOR WHEEZING  8.5 g  5  . amLODipine-benazepril (LOTREL) 5-20 MG  per capsule Take 1 capsule by mouth every evening.  30 capsule  3  . ARIPiprazole (ABILIFY) 5 MG tablet Take 1 tablet (5 mg total) by mouth 2 (two) times daily.  60 tablet  3  . brimonidine (ALPHAGAN) 0.2 % ophthalmic solution Place 1 drop into both eyes 3 (three) times daily.  5 mL  0  . Fluticasone-Salmeterol (ADVAIR DISKUS) 500-50 MCG/DOSE AEPB Inhale 1 puff into the lungs 2 (two) times daily.  60 each  5  . levothyroxine (SYNTHROID, LEVOTHROID) 112 MCG tablet Take 1 tablet (112 mcg total) by mouth daily before breakfast. BRAND NAME MEDICALLY NECESSARY.  30 tablet  3  . Loperamide-Simethicone (IMODIUM ADVANCED) 2-125 MG CHEW Chew by mouth as needed.       . penicillin v potassium (VEETID) 500 MG tablet Take 500 mg by mouth 4 (four) times daily.        No current facility-administered medications for this visit.    Previous Psychotropic Medications:  Medication   Abilify  Mirtazapine  Trazodone   Substance Abuse History in the last 12 months: Nicotine: Patient denies.  Alcohol: Patient denies.  Illicit Drugs: Patient denies.   Medical Consequences of Substance Abuse: Patient denies.   Legal Consequences of Substance Abuse: Patient denies.   Family Consequences of Substance Abuse: Patient denies.   Blackouts:  No DT's:  No Withdrawal Symptoms:  Negative None   Social History: Current Place of Residence: [place], [place] Place of Birth: [place], [place] Family Members: Lives with her husband and 3 dogs. Daughter lives in [place], [place].  Son lives in [place], [place] Marital Status:  Married-42 years Children: 2  Sons: 1  Daughters: 1 Relationships: Patient reports that her family is her main source of emotional support Education:  Corporate treasurer Problems/Performance: Patient had difficulty with math. Religious Beliefs/Practices: Yes-prays History of Abuse: none Occupational Experiences: Programmer, multimedia of a newspaper, Hydrographic surveyor; retired. Military History:  None. Legal  History: Patient denies. Hobbies/Interests: Patient enjoys spending time with people.  Politically active.  Family History:   Family History  Problem Relation Age of Onset  . Allergies Sister     X2  . Heart disease Mother   . Clotting disorder Mother   . Cancer Mother     Psychiatric Specialty Exam: Objective:  Appearance: Casual and Well Groomed  Eye Contact::  Fair  Speech:  Clear and Coherent and Normal Rate  Volume:  Normal  Mood:  "fine." 5-6/10  (0=Very depressed; 5=Neutral; 10=Very Happy)  Affect:  Constricted  Thought Process:  Coherent, Linear and Logical  Orientation:  Full (Time, Place, and Person)  Thought Content:  WDL  Suicidal Thoughts:  No  Homicidal Thoughts:  No  Judgement:  Fair  Insight:  Good  Psychomotor Activity:  Normal  Akathisia:  No  Memory: 3/3 immediate; 3/3 recent  Handed:  Left  AIMS (if indicated):  As noted  Assets:  Communication Skills Desire for Improvement Financial Resources/Insurance Housing Intimacy Leisure Time Physical Health Resilience Social Support Talents/Skills Transportation    Laboratory/X-Ray Psychological Evaluation(s)   None  [facility] SLUMS=21 MCND-no change from last exam   Assessment:   AXIS I 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 Allergic rhinitis   . Asthma   . Emphysema   . Hypertension   . Abnormal heart rhythm   . Partial deafness   . Hyperlipidemia   . Family history of anesthesia complication     TWIN SISTER HAS DELAYED REACTION   . Shortness of breath   . Cardiomegaly   . Hypothyroidism     SEVERE    Mild neurocognitive disorder due to another medical condition   Treatment Plan/Recommendations:   Plan of Care:  PLAN:  1. Affirm with the patient that the medications are taken as ordered. Patient   expressed understanding of how their medications were to be used.    Laboratory:  TSH to be repeated by PCP at their next appointment.  Psychotherapy: Therapy: brief supportive therapy provided.  Discussed psychosocial stressors in detail.  SLUMS exam suggests Mild neurocognitive disorder.  Medications:  Continue the following psychiatric medications as written prior to this appointment:  a) Will continue Aripiprazole 5 mg-again there is a question of whether mood symptoms are due to mood disorder versus solely medical causes-hyperglycemia and hypothyroidism, however, by history, she appeared manic. B) Repeat SLUMS exam Will consider Aricept or Namenda if patient SLUMS worsens below 20.  -Risks and benefits, side effects and alternatives discussed with patient, she was given an opportunity to ask questions about her medication, illness, and treatment. All current psychiatric medications have been reviewed and discussed with the patient and adjusted as clinically appropriate. The patient has been provided an accurate and updated list of the medications being now prescribed.   Routine PRN Medications:  Negative  Consultations: The patient was encouraged to keep all PCP and specialty clinic appointments.   Safety Concerns:   Patient told to call clinic if any problems occur. Patient advised to go to  ER  if she should develop SI/HI, side effects, or if symptoms worsen. Has crisis numbers to call if needed.    Other:   8. Patient was instructed to return to clinic in 2 months  9. The patient was advised to call and cancel their mental health appointment within 24 hours of the appointment, if they are unable to keep the appointment, as well as the three no show and termination from clinic policy. 10. The patient expressed understanding of the plan and agrees with the above.   Jacqulyn Cane, MD 9/3/20141:04 PM

## 2013-02-21 LAB — LIPID PANEL
HDL: 68 mg/dL (ref >39)
LDL Cholesterol: 121 mg/dL — ABNORMAL HIGH (ref 0–99)
Total CHOL/HDL Ratio: 3.1 Ratio
Triglycerides: 93 mg/dL (ref <150)
VLDL: 19 mg/dL (ref 0–40)

## 2013-02-21 LAB — GLUCOSE, FASTING: Glucose, Fasting: 83 mg/dL (ref 70–99)

## 2013-02-22 ENCOUNTER — Telehealth (HOSPITAL_COMMUNITY): Payer: Self-pay

## 2013-02-22 NOTE — Telephone Encounter (Signed)
Called patient's son. Discussed treatment plan with the plan.

## 2013-02-22 NOTE — Telephone Encounter (Signed)
WOULD LIKE TO TOUCH BASE WITH YOU ABOUT APPOINTMENT YESTERDAY

## 2013-03-01 ENCOUNTER — Ambulatory Visit: Payer: BC Managed Care – PPO | Admitting: Family Medicine

## 2013-03-15 ENCOUNTER — Encounter: Payer: Self-pay | Admitting: Family Medicine

## 2013-03-15 ENCOUNTER — Ambulatory Visit (INDEPENDENT_AMBULATORY_CARE_PROVIDER_SITE_OTHER): Payer: BC Managed Care – PPO | Admitting: Family Medicine

## 2013-03-15 VITALS — BP 119/53 | HR 55 | Temp 97.6°F | Resp 16 | Ht 60.5 in | Wt 109.0 lb

## 2013-03-15 DIAGNOSIS — E039 Hypothyroidism, unspecified: Secondary | ICD-10-CM

## 2013-03-15 DIAGNOSIS — I10 Essential (primary) hypertension: Secondary | ICD-10-CM

## 2013-03-15 DIAGNOSIS — M8580 Other specified disorders of bone density and structure, unspecified site: Secondary | ICD-10-CM

## 2013-03-15 DIAGNOSIS — Z79899 Other long term (current) drug therapy: Secondary | ICD-10-CM

## 2013-03-15 DIAGNOSIS — E038 Other specified hypothyroidism: Secondary | ICD-10-CM

## 2013-03-15 DIAGNOSIS — Z1239 Encounter for other screening for malignant neoplasm of breast: Secondary | ICD-10-CM

## 2013-03-15 LAB — COMPREHENSIVE METABOLIC PANEL
AST: 16 U/L (ref 0–37)
Alkaline Phosphatase: 96 U/L (ref 39–117)
BUN: 16 mg/dL (ref 6–23)
Creat: 0.87 mg/dL (ref 0.50–1.10)
Glucose, Bld: 87 mg/dL (ref 70–99)
Potassium: 4.4 mEq/L (ref 3.5–5.3)
Total Bilirubin: 0.2 mg/dL — ABNORMAL LOW (ref 0.3–1.2)

## 2013-03-15 NOTE — Patient Instructions (Signed)
You need to start the Silver Sneakers program at the [facility] so call to inquire about these classes.  Make sure you make an appointment with your opthomalogist to recheck on glaucoma.

## 2013-03-15 NOTE — Progress Notes (Signed)
Subjective:    Patient ID: Cheryl [name], female    DOB: May 16, 1946, 67 y.o.   MRN: [id number] Chief Complaint  Patient presents with  . Follow-up    htn/thyroid    HPI  Taking synthroid every morning. Took her last dose today so is going to pick up a refill from the pharmacy on her way home.   No falls, no longer feeling as dizzy or weak. Doing well. No complaints today.  Stopped the hctz after our last visit but still taking her lotrel every night.  Past Medical History  Diagnosis Date  . Allergic rhinitis   . Asthma   . Emphysema   . Hypertension   . Abnormal heart rhythm   . Partial deafness   . Hyperlipidemia   . Family history of anesthesia complication     TWIN SISTER HAS DELAYED REACTION   . Shortness of breath   . Cardiomegaly   . Hypothyroidism     SEVERE    Current Outpatient Prescriptions on File Prior to Visit  Medication Sig Dispense Refill  . albuterol (PROAIR HFA) 108 (90 BASE) MCG/ACT inhaler INHALE 2 PUFFS INTO THE LUNGS EVERY 6 HOURS AS NEEDED FOR WHEEZING  8.5 g  5  . amLODipine-benazepril (LOTREL) 5-20 MG per capsule Take 1 capsule by mouth every evening.  30 capsule  3  . ARIPiprazole (ABILIFY) 5 MG tablet Take 1 tablet (5 mg total) by mouth 2 (two) times daily.  60 tablet  3  . Fluticasone-Salmeterol (ADVAIR DISKUS) 500-50 MCG/DOSE AEPB Inhale 1 puff into the lungs 2 (two) times daily.  60 each  5  . brimonidine (ALPHAGAN) 0.2 % ophthalmic solution Place 1 drop into both eyes 3 (three) times daily.  5 mL  0  . Loperamide-Simethicone (IMODIUM ADVANCED) 2-125 MG CHEW Chew by mouth as needed.        No current facility-administered medications on file prior to visit.   Allergies  Allergen Reactions  . Aspirin     REACTION: asthma flare  . Codeine     REACTION: ulcer    Review of Systems  Constitutional: Negative for fever, chills, diaphoresis and appetite change.  Eyes: Negative for visual disturbance.  Respiratory: Negative for cough and  shortness of breath.   Cardiovascular: Negative for chest pain, palpitations and leg swelling.  Genitourinary: Negative for decreased urine volume.  Neurological: Negative for dizziness, syncope, light-headedness and headaches.  Hematological: Does not bruise/bleed easily.  Psychiatric/Behavioral: Negative for behavioral problems, dysphoric mood and agitation. The patient is not nervous/anxious.       BP 119/53  Pulse 55  Temp(Src) 97.6 F (36.4 C) (Oral)  Resp 16  Ht 5' 0.5" (1.537 m)  Wt 109 lb (49.442 kg)  BMI 20.93 kg/m2 Objective:   Physical Exam  Constitutional: She is oriented to person, place, and time. She appears well-developed and well-nourished. No distress.  HENT:  Head: Normocephalic and atraumatic.  Right Ear: External ear normal.  Left Ear: External ear normal.  Eyes: Conjunctivae are normal. No scleral icterus.  Neck: Normal range of motion. Neck supple. No thyromegaly present.  Cardiovascular: Normal rate, regular rhythm and intact distal pulses.   Murmur heard.  Decrescendo systolic murmur is present with a grade of 2/6  Pulmonary/Chest: Effort normal and breath sounds normal. No respiratory distress.  Musculoskeletal: She exhibits no edema.  Lymphadenopathy:    She has no cervical adenopathy.  Neurological: She is alert and oriented to person, place, and time.  Skin: Skin  is warm and dry. She is not diaphoretic. No erythema.  Psychiatric: She has a normal mood and affect. Her behavior is normal.      Assessment & Plan:  Severe hypothyroidism - Plan: TSH, T3, Free, T4, Free - levels returned showing 112 mcg dose is slightly to high. Decrease to and recheck in 2 moths.  Osteopenia - Plan: DG Bone Density - Recommend starting weight bearing exercises - consider [facility] silver sneakers program.  HYPERTENSION  Screening for breast cancer - Plan: MM Digital Screening  Encounter for long-term (current) use of other medications - Plan: Comprehensive  metabolic panel  Will make a f/u appt with optho to f/u on her glaucoma.   Plan of care discussed by phone w/ pt's son, POA, Cheryl [name].  Need to let son know when lab results come back - can text son if we would like.

## 2013-03-16 LAB — T4, FREE: Free T4: 2.05 ng/dL — ABNORMAL HIGH (ref 0.80–1.80)

## 2013-03-16 MED ORDER — LEVOTHYROXINE SODIUM 100 MCG PO TABS: 100.0000 ug | ORAL_TABLET | Freq: Every day | ORAL | Status: DC

## 2013-04-03 ENCOUNTER — Other Ambulatory Visit: Payer: Self-pay

## 2013-04-03 ENCOUNTER — Ambulatory Visit: Payer: Self-pay

## 2013-04-04 ENCOUNTER — Other Ambulatory Visit: Payer: Self-pay | Admitting: Family Medicine

## 2013-04-04 ENCOUNTER — Ambulatory Visit: Admission: RE | Admit: 2013-04-04 | Payer: BC Managed Care – PPO | Source: Ambulatory Visit

## 2013-04-04 ENCOUNTER — Ambulatory Visit
Admission: RE | Admit: 2013-04-04 | Discharge: 2013-04-04 | Disposition: A | Discharge status: Home / Self Care | Payer: BC Managed Care – PPO | Source: Ambulatory Visit | Attending: Family Medicine | Admitting: Family Medicine

## 2013-04-04 DIAGNOSIS — Z1231 Encounter for screening mammogram for malignant neoplasm of breast: Secondary | ICD-10-CM

## 2013-04-22 ENCOUNTER — Ambulatory Visit (HOSPITAL_COMMUNITY): Payer: Self-pay | Admitting: Psychiatry

## 2013-06-07 ENCOUNTER — Ambulatory Visit (INDEPENDENT_AMBULATORY_CARE_PROVIDER_SITE_OTHER): Payer: BC Managed Care – PPO | Admitting: Family Medicine

## 2013-06-07 VITALS — BP 138/75 | HR 51 | Temp 97.6°F | Resp 16 | Ht 61.0 in | Wt 107.0 lb

## 2013-06-07 DIAGNOSIS — J449 Chronic obstructive pulmonary disease, unspecified: Secondary | ICD-10-CM

## 2013-06-07 DIAGNOSIS — E038 Other specified hypothyroidism: Secondary | ICD-10-CM

## 2013-06-07 DIAGNOSIS — R7309 Other abnormal glucose: Secondary | ICD-10-CM

## 2013-06-07 DIAGNOSIS — E039 Hypothyroidism, unspecified: Secondary | ICD-10-CM

## 2013-06-07 DIAGNOSIS — R7303 Prediabetes: Secondary | ICD-10-CM

## 2013-06-07 DIAGNOSIS — J45909 Unspecified asthma, uncomplicated: Secondary | ICD-10-CM

## 2013-06-07 DIAGNOSIS — E785 Hyperlipidemia, unspecified: Secondary | ICD-10-CM

## 2013-06-07 DIAGNOSIS — F0630 Mood disorder due to known physiological condition, unspecified: Secondary | ICD-10-CM

## 2013-06-07 DIAGNOSIS — Z79899 Other long term (current) drug therapy: Secondary | ICD-10-CM

## 2013-06-07 DIAGNOSIS — M818 Other osteoporosis without current pathological fracture: Secondary | ICD-10-CM

## 2013-06-07 DIAGNOSIS — R0600 Dyspnea, unspecified: Secondary | ICD-10-CM

## 2013-06-07 DIAGNOSIS — I10 Essential (primary) hypertension: Secondary | ICD-10-CM

## 2013-06-07 LAB — COMPREHENSIVE METABOLIC PANEL
ALT: 15 U/L (ref 0–35)
Alkaline Phosphatase: 115 U/L (ref 39–117)
Glucose, Bld: 93 mg/dL (ref 70–99)
Sodium: 141 mEq/L (ref 135–145)
Total Bilirubin: 0.5 mg/dL (ref 0.3–1.2)
Total Protein: 7.1 g/dL (ref 6.0–8.3)

## 2013-06-07 LAB — LIPID PANEL
Cholesterol: 226 mg/dL — ABNORMAL HIGH (ref 0–200)
HDL: 71 mg/dL (ref >39)
Total CHOL/HDL Ratio: 3.2 Ratio
Triglycerides: 77 mg/dL (ref <150)
VLDL: 15 mg/dL (ref 0–40)

## 2013-06-07 LAB — HEMOGLOBIN A1C: Hgb A1c MFr Bld: 6 % — ABNORMAL HIGH (ref <5.7)

## 2013-06-07 MED ORDER — AMLODIPINE BESY-BENAZEPRIL HCL 5-20 MG PO CAPS: 1.0000 | ORAL_CAPSULE | Freq: Every evening | ORAL | Status: DC

## 2013-06-07 MED ORDER — ALBUTEROL SULFATE HFA 108 (90 BASE) MCG/ACT IN AERS: INHALATION_SPRAY | RESPIRATORY_TRACT | Status: DC

## 2013-06-07 MED ORDER — FLUTICASONE-SALMETEROL 500-50 MCG/DOSE IN AEPB: 1.0000 | INHALATION_SPRAY | Freq: Two times a day (BID) | RESPIRATORY_TRACT | Status: DC

## 2013-06-07 NOTE — Progress Notes (Signed)
Subjective:    Patient ID: Cheryl [name], female    DOB: 03-06-1946, 67 y.o.   MRN: [id number] Chief Complaint  Patient presents with  . Follow-up    3 month    HPI  Cheryl [name] is doing well. No complaints or concerns today.  Here for TSH recheck - still has some of the levothyroxine 100 mcg so ok to wait to refill until after labs return.   Her husband is not w/ her today - just dropped her off as helping a friend out.  Has an appt w/ optho next wk to recheck her glaucoma.  Does not think she has an appt w/ psych - not sure when that was supposed to happen. Mentally feels fine.  Has imodium on her med list but doesn't usu take - keeps it on hand just in case of diarrhea but not a sig problem.  Past Medical History  Diagnosis Date  . Allergic rhinitis   . Asthma   . Emphysema   . Hypertension   . Abnormal heart rhythm   . Partial deafness   . Hyperlipidemia   . Family history of anesthesia complication     TWIN SISTER HAS DELAYED REACTION   . Shortness of breath   . Cardiomegaly   . Hypothyroidism     SEVERE    Current Outpatient Prescriptions on File Prior to Visit  Medication Sig Dispense Refill  . ARIPiprazole (ABILIFY) 5 MG tablet Take 1 tablet (5 mg total) by mouth 2 (two) times daily.  60 tablet  3  . brimonidine (ALPHAGAN) 0.2 % ophthalmic solution Place 1 drop into both eyes 3 (three) times daily.  5 mL  0  . Loperamide-Simethicone (IMODIUM ADVANCED) 2-125 MG CHEW Chew by mouth as needed.        No current facility-administered medications on file prior to visit.   Allergies  Allergen Reactions  . Aspirin     REACTION: asthma flare  . Codeine     REACTION: ulcer    Review of Systems  Constitutional: Negative for fever, chills, diaphoresis, activity change, appetite change, fatigue and unexpected weight change.  HENT: Negative for trouble swallowing.   Cardiovascular: Negative for palpitations.  Gastrointestinal: Negative for diarrhea and constipation.    Endocrine: Negative for cold intolerance and heat intolerance.  Genitourinary: Negative for frequency and decreased urine volume.  Skin: Negative for color change, pallor and rash.  Neurological: Negative for tremors, syncope, weakness and numbness.  Psychiatric/Behavioral: Negative for suicidal ideas, hallucinations, behavioral problems, confusion, self-injury, dysphoric mood, decreased concentration and agitation. The patient is not nervous/anxious and is not hyperactive.       BP 138/75  Pulse 51  Temp(Src) 97.6 F (36.4 C)  Resp 16  Ht 5\' 1"  (1.549 m)  Wt 107 lb (48.535 kg)  BMI 20.23 kg/m2 Objective:   Physical Exam  Constitutional: She is oriented to person, place, and time. She appears well-developed and well-nourished. No distress.  HENT:  Head: Normocephalic and atraumatic.  Right Ear: External ear normal.  Left Ear: External ear normal.  Eyes: Conjunctivae are normal. No scleral icterus.  Neck: Normal range of motion. Neck supple. No thyromegaly present.  Cardiovascular: Normal rate, regular rhythm, normal heart sounds and intact distal pulses.   Pulmonary/Chest: Effort normal and breath sounds normal. No respiratory distress.  Musculoskeletal: She exhibits no edema.  Lymphadenopathy:    She has no cervical adenopathy.  Neurological: She is alert and oriented to person, place, and time.  Skin: Skin is warm and dry. She is not diaphoretic. No erythema.  Psychiatric: She has a normal mood and affect. Her behavior is normal.      Assessment & Plan:   Severe hypothyroidism - Plan: TSH, T3, Free, T4, Free - last visit decreased from 112 to 100 - tsh still suprressed but ft3/4 now nml however ft4 at very high end. Decrease to 88 and recheck again in 2 mos.  Other osteoporosis - referred for dexa at last OV but not done though mammogram was. Rec scheduling again.  Other and unspecified hyperlipidemia - Plan: Lipid panel - recheck after levothyroxine dose normalized  Mood  disorder in conditions classified elsewhere - was supposed to f/u w/ psych arounmd 11/8 but looks like appt was never scheduled. Reminded pt to sched today, phone # given.  HYPERTENSION - mildly elevated today but well controlled prior on lotrel- cont to monitor as had to stop hctz sev mos prior due to symptomatic hypotension and fall risk.  Prediabetes - Plan: Hemoglobin A1c - stable  Encounter for long-term (current) use of other medications - Plan: Comprehensive metabolic panel  Asthma/COPD (chronic obstructive pulmonary disease) - Plan: Fluticasone-Salmeterol (ADVAIR DISKUS) 500-50 MCG/DOSE AEPB refilled - pt currently asymptomatic - saw Dr. Salomon Mast.  Pt's son HCPOA Jull Harral needs to be apprised after all visits - will send letter in the mail re: changes and rec today.  Meds ordered this encounter  Medications  . amLODipine-benazepril (LOTREL) 5-20 MG per capsule    Sig: Take 1 capsule by mouth every evening.    Dispense:  90 capsule    Refill:  3  . albuterol (PROAIR HFA) 108 (90 BASE) MCG/ACT inhaler    Sig: INHALE 2 PUFFS INTO THE LUNGS EVERY 6 HOURS AS NEEDED FOR WHEEZING    Dispense:  8.5 g    Refill:  5  . Fluticasone-Salmeterol (ADVAIR DISKUS) 500-50 MCG/DOSE AEPB    Sig: Inhale 1 puff into the lungs 2 (two) times daily.    Dispense:  60 each    Refill:  5   [name], MD MPH

## 2013-06-07 NOTE — Patient Instructions (Signed)
You are overdue for your follow-up appointment with your psychiatrist. I think he wanted to see you back about 5 weeks ago so please call the number below to schedule an appointment with: Larena Sox, MD  Department: [facility]  Dept Phone: [phone]  You seem to be doing great. We will refill your synthroid AFTER your labs come back and I will touch base with your son Ronald Pippins then as well.  Unless anything comes up in the meantime or we want to make some changes based upon your lab results, I will plan on seeing you back in 6 months.

## 2013-06-08 LAB — T4, FREE: Free T4: 1.78 ng/dL (ref 0.80–1.80)

## 2013-06-09 ENCOUNTER — Encounter: Payer: Self-pay | Admitting: Family Medicine

## 2013-06-09 MED ORDER — LEVOTHYROXINE SODIUM 88 MCG PO TABS: 88.0000 ug | ORAL_TABLET | Freq: Every day | ORAL | Status: DC

## 2013-06-24 ENCOUNTER — Telehealth: Payer: Self-pay

## 2013-06-24 DIAGNOSIS — R0600 Dyspnea, unspecified: Secondary | ICD-10-CM

## 2013-06-24 DIAGNOSIS — J449 Chronic obstructive pulmonary disease, unspecified: Secondary | ICD-10-CM

## 2013-06-24 MED ORDER — ARIPIPRAZOLE 5 MG PO TABS: 5.0000 mg | ORAL_TABLET | Freq: Two times a day (BID) | ORAL | Status: DC

## 2013-06-24 MED ORDER — FLUTICASONE-SALMETEROL 500-50 MCG/DOSE IN AEPB: 1.0000 | INHALATION_SPRAY | Freq: Two times a day (BID) | RESPIRATORY_TRACT | Status: DC

## 2013-06-24 MED ORDER — ALBUTEROL SULFATE HFA 108 (90 BASE) MCG/ACT IN AERS: INHALATION_SPRAY | RESPIRATORY_TRACT | Status: DC

## 2013-06-24 NOTE — Telephone Encounter (Signed)
Pt would like to have a refill on pro-air, abilify, advair. Best# [phone] or [phone] [facility] [address]

## 2013-06-24 NOTE — Telephone Encounter (Signed)
Refilled but abilify just refilled x 2 mos only to give her time to schedule an appt w/ her psychiatrist who was prescribing this.  I think i put his name and phone # in the AVS of her last visit if she needs it again - call NOW and sched an appt so she can see him before her abilify runs out in 2 mos.

## 2013-06-25 NOTE — Telephone Encounter (Signed)
Called her to advise. She indicates she has not called for an appointment, but she will call for the appointment, the number is in [facility]  Dept Phone: [phone] Advised her we can not renew this again, must come from the Psychiatrist. She will call for the appointment.

## 2013-06-26 ENCOUNTER — Telehealth (HOSPITAL_COMMUNITY): Payer: Self-pay

## 2013-06-26 NOTE — Telephone Encounter (Signed)
Per EMR records. Patient has had Abilify filled by her PCP

## 2013-06-28 NOTE — Telephone Encounter (Signed)
Called patient. Asked her to schedule an appointment within 2 months prior to her medications refills expiring.

## 2013-07-02 ENCOUNTER — Telehealth (HOSPITAL_COMMUNITY): Payer: Self-pay | Admitting: Psychiatry

## 2013-07-02 NOTE — Telephone Encounter (Signed)
The patient reports tremors, lightheadness for at least 3 weeks. Patient husband reports some sedation.   PLAN: Asked patient to schedule and appointment. Asked him to reduce evening Aripiprazole to 2.5 and call tomorrow.

## 2013-07-05 ENCOUNTER — Encounter (HOSPITAL_COMMUNITY): Payer: Self-pay | Admitting: Psychiatry

## 2013-07-05 ENCOUNTER — Ambulatory Visit (INDEPENDENT_AMBULATORY_CARE_PROVIDER_SITE_OTHER): Payer: BC Managed Care – PPO | Admitting: Psychiatry

## 2013-07-05 VITALS — BP 118/84 | HR 67 | Wt 102.0 lb

## 2013-07-05 DIAGNOSIS — F0630 Mood disorder due to known physiological condition, unspecified: Secondary | ICD-10-CM

## 2013-07-05 DIAGNOSIS — F09 Unspecified mental disorder due to known physiological condition: Secondary | ICD-10-CM

## 2013-07-05 MED ORDER — ARIPIPRAZOLE 5 MG PO TABS: 5.0000 mg | ORAL_TABLET | Freq: Two times a day (BID) | ORAL | Status: DC

## 2013-07-05 NOTE — Progress Notes (Signed)
[facility]  Cheryl DarbyDonna E [name] 05/23/1946    Patient Identification:  Cheryl Darbyonna E Rumbaugh Date of Evaluation:  07/05/2013 Chief Complaint:    Chief Complaint  Patient presents with  . Follow-up   History of Chief Complaint:  HPI Comments: Ms. 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 The patient is referred for psychiatric services for  medication management.   Elements:  Location: Patient has a worsening of her energy levels, and difficulty with gait. Quality: No suicidal ideation.No manic behavior, no irritability Severity: Mood symptoms -Negative, MNCD-moderate Timing: NO current symptoms Duration: Symptoms have resolved. See review of symptoms below.  Context: Medications realted issues. Patient reports some difficulty with memory.   Patient is here with her daughter and husband who provide collateral information. They endorse a slowing of movements, and a loss of ability to do IADL's such as cooking.  They also endorse a new shuffling of gait.    Review of Systems  Constitutional: Positive for chills, activity change, appetite change and fatigue. Negative for fever.  Respiratory: Negative for apnea, cough, shortness of breath and wheezing.   Cardiovascular: Negative for chest pain, palpitations and leg swelling.  Gastrointestinal: Negative for nausea, vomiting, abdominal pain, diarrhea and constipation.  Endocrine: Positive for cold intolerance. Negative for heat intolerance, polydipsia, polyphagia and polyuria.  Neurological: Positive for dizziness. Negative for syncope and numbness.    Filed Vitals:   07/05/13 0937  BP: 118/84  Pulse: 67  Weight: 102 lb (46.267 kg)   Physical Exam  Vitals reviewed. Constitutional: She appears well-developed and well-nourished. No distress.  Skin: She is not diaphoretic.  Musculoskeletal: Strength & Muscle  Tone: within normal limits Gait & Station: unsteady, shuffle Patient leans: Front   Depressive Symptoms: impaired memory, hypersomnia, decreased appetite,  (Hypo) Manic Symptoms:   Elevated Mood:  Negative Irritable Mood:  Negative Grandiosity:  No Distractibility:  No Labiality of Mood:  Negative Delusions:  No Hallucinations:  No Impulsivity:  No Sexually Inappropriate Behavior:  No Financial Extravagance:  No Flight of Ideas:  No  Anxiety Symptoms: Excessive Worry:  No Panic Symptoms:  No Agoraphobia:  No Obsessive Compulsive: No  Symptoms: None, Specific Phobias:  No Social Anxiety:  No  Psychotic Symptoms:  Hallucinations: No None Delusions:  No Paranoia:  No   Ideas of Reference:  No  PTSD Symptoms: Ever had a traumatic exposure:  No Had a traumatic exposure in the last month:  No Re-experiencing: No None Hypervigilance:  No Hyperarousal: No None Avoidance: No None  Traumatic Brain Injury: No   Past Psychiatric History: Diagnosis: HIstory of Diagnosis of Bipolar I disorder  Hospitalizations: NOne  Outpatient Care: None  Substance Abuse Care: Patient denies  Self-Mutilation: Patient denies  Suicidal Attempts: Patient denies  Violent Behaviors: Patient denies   Past Medical History:   Past Medical History  Diagnosis Date  . Allergic rhinitis   . Asthma   . Emphysema   . Hypertension   . Abnormal heart rhythm   . Partial deafness   . Hyperlipidemia   . Family history of anesthesia complication     TWIN SISTER HAS DELAYED REACTION   . Shortness of breath   . Cardiomegaly   . Hypothyroidism     SEVERE    History of Loss of Consciousness:  No Seizure History:  Yes Cardiac History:  Yes-HTN  Allergies:   Allergies  Allergen Reactions  . Aspirin     REACTION: asthma flare  . Codeine     REACTION: ulcer   Current Medications:  Current Outpatient Prescriptions  Medication Sig Dispense Refill  . albuterol (PROAIR HFA) 108 (90 BASE)  MCG/ACT inhaler INHALE 2 PUFFS INTO THE LUNGS EVERY 6 HOURS AS NEEDED FOR WHEEZING  8.5 g  5  . amLODipine-benazepril (LOTREL) 5-20 MG per capsule Take 1 capsule by mouth every evening.  90 capsule  3  . ARIPiprazole (ABILIFY) 5 MG tablet Take 5 mg by mouth 2 (two) times daily. Take 5 mg in the morning and 2.5 mg in the evening.      . brimonidine (ALPHAGAN) 0.2 % ophthalmic solution Place 1 drop into both eyes 3 (three) times daily.  5 mL  0  . Fluticasone-Salmeterol (ADVAIR DISKUS) 500-50 MCG/DOSE AEPB Inhale 1 puff into the lungs 2 (two) times daily.  60 each  5  . levothyroxine (SYNTHROID, LEVOTHROID) 88 MCG tablet Take 1 tablet (88 mcg total) by mouth daily before breakfast. BRAND NAME MEDICALLY NECESSARY  90 tablet  0  . Loperamide-Simethicone (IMODIUM ADVANCED) 2-125 MG CHEW Chew by mouth as needed.        No current facility-administered medications for this visit.    Previous Psychotropic Medications:  Medication   Abilify  Mirtazapine  Trazodone   Substance Abuse History in the last 12 months: Nicotine: Patient denies.  Alcohol: Patient denies.  Illicit Drugs: Patient denies.   Medical Consequences of Substance Abuse: Patient denies.   Legal Consequences of Substance Abuse: Patient denies.   Family Consequences of Substance Abuse: Patient denies.   Blackouts:  No DT's:  No Withdrawal Symptoms:  Negative None   Social History: Current Place of Residence: [place], [place] Place of Birth: [place], [place] Family Members: Lives with her husband and 3 dogs. Daughter lives in [place], [place].  Son lives in [place], [place] Marital Status:  Married-42 years Children: 2  Sons: 1  Daughters: 1 Relationships: Patient reports that her family is her main source of emotional support Education:  Corporate treasurer Problems/Performance: Patient had difficulty with math. Religious Beliefs/Practices: Yes-prays History of Abuse: none Occupational Experiences: Programmer, multimedia of a newspaper,  Hydrographic surveyor; retired. Military History:  None. Legal History: Patient denies. Hobbies/Interests: Patient enjoys spending time with people.  Politically active.  Family History:   Family History  Problem Relation Age of Onset  . Allergies Sister     X2  . Heart disease Mother   . Clotting disorder Mother   . Cancer Mother     Psychiatric Specialty Exam: Objective:  Appearance: Casual and Well Groomed  Eye Contact::  Fair  Speech:  Clear and Coherent and Normal Rate  Volume:  Normal  Mood:  "fine." 5-6/10  (0=Very depressed; 5=Neutral; 10=Very Happy)  Affect:  Constricted  Thought Process:  Coherent, Linear and Logical  Orientation:  Full (Time, Place, and Person)  Thought Content:  WDL  Suicidal Thoughts:  No  Homicidal Thoughts:  No  Judgement:  Fair  Insight:  Good  Psychomotor Activity:  Normal  Akathisia:  No  Memory: 3/3 immediate; 1/3 recent  Handed:  Left  AIMS (if indicated):  As noted  Assets:  Communication Skills Desire for Improvement Financial Resources/Insurance Housing Intimacy Leisure Time Physical Health Resilience Social Support Talents/Skills Transportation    Laboratory/X-Ray Psychological Evaluation(s)   None  None   Assessment:  Mood Disorder due to a general medical condition-(Hypothyroidism and Hyperglycemia)-worsening.   AXIS I Mood Disorder  due to a general medical condition-(Hypothyroidism and Hyperglycemia); Mild Neurocognitive Disorder;  Rule out Bipolar Disorder, NEC,   AXIS II No diagnosis  AXIS III Past Medical History  Diagnosis Date  . Allergic rhinitis   . Asthma   . Emphysema   . Hypertension   . Abnormal heart rhythm   . Partial deafness   . Hyperlipidemia   . Family history of anesthesia complication     TWIN SISTER HAS DELAYED REACTION   . Shortness of breath   . Cardiomegaly   . Hypothyroidism     SEVERE    Mild neurocognitive disorder due to another medical condition   Treatment  Plan/Recommendations:   Plan of Care:  PLAN:  1. Affirm with the patient that the medications are taken as ordered. Patient  expressed understanding of how their medications were to be used.    Laboratory: Will order Thyroid panel, FBS, and FLP  Psychotherapy: Therapy: brief supportive therapy provided.  Discussed psychosocial stressors in detail.  SLUMS exam suggests Mild neurocognitive disorder.  Medications:  Continue the following psychiatric medications as written prior to this appointment:  a) Will decrease Aripiprazole 5 mg-QHS in 7 days. B) Will Repeat SLUMS exam at next appointment. Will consider Aricept or Namenda if patient SLUMS worsens below 20.  -Risks and benefits, side effects and alternatives discussed with patient, she was given an opportunity to ask questions about her medication, illness, and treatment. All current psychiatric medications have been reviewed and discussed with the patient and adjusted as clinically appropriate. The patient has been provided an accurate and updated list of the medications being now prescribed.   Routine PRN Medications:  Negative  Consultations: The patient was encouraged to keep all PCP and specialty clinic appointments.   Safety Concerns:   Patient told to call clinic if any problems occur. Patient advised to go to  ER  if she should develop SI/HI, side effects, or if symptoms worsen. Has crisis numbers to call if needed.    Other:   8. Patient was instructed to return to clinic in 2 months  9. The patient was advised to call and cancel their mental health appointment within 24 hours of the appointment, if they are unable to keep the appointment, as well as the three no show and termination from clinic policy. 10. The patient expressed understanding of the plan and agrees with the above.  Time Spent: 30 minutes Jacqulyn Cane, MD 1/16/20159:33 AM

## 2013-07-09 LAB — T3, FREE: T3, Free: 4.1 pg/mL (ref 2.3–4.2)

## 2013-07-09 LAB — GLUCOSE, RANDOM: Glucose, Bld: 115 mg/dL — ABNORMAL HIGH (ref 70–99)

## 2013-07-09 LAB — TSH: TSH: 0.061 u[IU]/mL — AB (ref 0.350–4.500)

## 2013-07-09 LAB — LIPID PANEL
CHOL/HDL RATIO: 2.8 ratio
CHOLESTEROL: 189 mg/dL (ref 0–200)
HDL: 68 mg/dL (ref >39)
LDL CALC: 106 mg/dL — AB (ref 0–99)
TRIGLYCERIDES: 77 mg/dL (ref <150)
VLDL: 15 mg/dL (ref 0–40)

## 2013-07-09 LAB — T4, FREE: Free T4: 2.05 ng/dL — ABNORMAL HIGH (ref 0.80–1.80)

## 2013-07-12 ENCOUNTER — Telehealth (HOSPITAL_COMMUNITY): Payer: Self-pay | Admitting: Psychiatry

## 2013-07-12 NOTE — Telephone Encounter (Signed)
Called patient. Left message about results.

## 2013-07-19 ENCOUNTER — Telehealth (HOSPITAL_COMMUNITY): Payer: Self-pay

## 2013-07-19 DIAGNOSIS — E039 Hypothyroidism, unspecified: Secondary | ICD-10-CM

## 2013-07-19 DIAGNOSIS — E038 Other specified hypothyroidism: Secondary | ICD-10-CM

## 2013-07-19 DIAGNOSIS — F0630 Mood disorder due to known physiological condition, unspecified: Secondary | ICD-10-CM

## 2013-07-19 NOTE — Telephone Encounter (Signed)
Called patient. Reviewed labs. Can repeat TFT for her next visit.

## 2013-08-07 ENCOUNTER — Telehealth (HOSPITAL_COMMUNITY): Payer: Self-pay | Admitting: Psychiatry

## 2013-08-07 DIAGNOSIS — F0630 Mood disorder due to known physiological condition, unspecified: Secondary | ICD-10-CM

## 2013-08-07 NOTE — Telephone Encounter (Signed)
Will order lab work. FBS, HBA1c.

## 2013-08-08 ENCOUNTER — Ambulatory Visit (HOSPITAL_COMMUNITY): Payer: Self-pay | Admitting: Psychiatry

## 2013-08-09 LAB — HEMOGLOBIN A1C
Hgb A1c MFr Bld: 5.8 % — ABNORMAL HIGH (ref <5.7)
Mean Plasma Glucose: 120 mg/dL — ABNORMAL HIGH (ref <117)

## 2013-08-09 LAB — GLUCOSE, RANDOM: Glucose, Bld: 134 mg/dL — ABNORMAL HIGH (ref 70–99)

## 2013-08-13 ENCOUNTER — Ambulatory Visit (HOSPITAL_COMMUNITY): Payer: Self-pay | Admitting: Psychiatry

## 2013-08-14 ENCOUNTER — Ambulatory Visit (HOSPITAL_COMMUNITY): Payer: Self-pay | Admitting: Psychiatry

## 2013-09-16 ENCOUNTER — Other Ambulatory Visit: Payer: Self-pay | Admitting: Family Medicine

## 2013-09-19 ENCOUNTER — Encounter (INDEPENDENT_AMBULATORY_CARE_PROVIDER_SITE_OTHER): Payer: Self-pay

## 2013-09-19 ENCOUNTER — Ambulatory Visit (INDEPENDENT_AMBULATORY_CARE_PROVIDER_SITE_OTHER): Payer: BC Managed Care – PPO | Admitting: Psychiatry

## 2013-09-19 ENCOUNTER — Encounter (HOSPITAL_COMMUNITY): Payer: Self-pay | Admitting: Psychiatry

## 2013-09-19 VITALS — BP 120/63 | HR 49 | Wt 105.0 lb

## 2013-09-19 DIAGNOSIS — F0630 Mood disorder due to known physiological condition, unspecified: Secondary | ICD-10-CM

## 2013-09-19 DIAGNOSIS — F09 Unspecified mental disorder due to known physiological condition: Secondary | ICD-10-CM

## 2013-09-19 MED ORDER — ARIPIPRAZOLE 5 MG PO TABS: 5.0000 mg | ORAL_TABLET | Freq: Once | ORAL | Status: DC

## 2013-09-19 NOTE — Progress Notes (Signed)
[facility]  Cheryl DarbyDonna [name] [name] 02/28/1946   Patient Identification:  Cheryl [name] Date of Evaluation:  09/19/2013 Chief Complaint:    Chief Complaint  Patient presents with  . Follow-up  . 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 The patient is referred for psychiatric services for  medication management.   Elements:  Location: Patient has been good. Quality:  In the area of affective symptoms, patient appears euthymic. Patient denies current suicidal ideation, intent, or plan. Patient denies current homicidal ideation, intent, or plan. Patient denies auditory hallucinations. Patient denies visual hallucinations. Patient denies symptoms of paranoia. Patient states sleep is good, with approximately 9 hours of sleep per night. Appetite is good. Energy level is good. Patient denies symptoms of anhedonia. Patient denies hopelessness, helplessness, or guilt.   Severity:  Depression: 8/10 (0=Very depressed; 5=Neutral; 10=Very Happy)  Anxiety- 6-7/10 (0=no anxiety; 5= moderate/tolerable anxiety; 10= panic attacks) Timing: NO current symptoms Duration: Symptoms have resolved. See review of symptoms below.  Context: Medications realted issues. Patient reports some difficulty with memory.   Patient is here with her  husband who provide collateral information. They report the patient    Review of Systems  Constitutional: Positive for chills, activity change, appetite change and fatigue. Negative for fever.  Respiratory: Negative for apnea, cough, shortness of breath and wheezing.   Cardiovascular: Negative for chest pain, palpitations and leg swelling.  Gastrointestinal: Negative for nausea, vomiting, abdominal pain, diarrhea and constipation.  Endocrine: Positive for cold intolerance. Negative for heat  intolerance, polydipsia, polyphagia and polyuria.  Neurological: Positive for dizziness. Negative for syncope and numbness.    Filed Vitals:   09/19/13 1216  BP: 120/63  Pulse: 49  Weight: 105 lb (47.628 kg)   Physical Exam  Vitals reviewed. Constitutional: She appears well-developed and well-nourished. No distress.  Skin: She is not diaphoretic.  Musculoskeletal: Strength & Muscle Tone: within normal limits Gait & Station: unsteady, shuffle Patient leans: Front   Depressive Symptoms:None  (Hypo) Manic Symptoms:   Elevated Mood:  Negative Irritable Mood:  Negative Grandiosity:  No Distractibility:  No Labiality of Mood:  Negative Delusions:  No Hallucinations:  No Impulsivity:  No Sexually Inappropriate Behavior:  No Financial Extravagance:  No Flight of Ideas:  No  Anxiety Symptoms: Excessive Worry:  No Panic Symptoms:  No Agoraphobia:  No Obsessive Compulsive: No  Symptoms: None, Specific Phobias:  No Social Anxiety:  No  Psychotic Symptoms:  Hallucinations: No None Delusions:  No Paranoia:  No   Ideas of Reference:  No  PTSD Symptoms: Ever had a traumatic exposure:  No Had a traumatic exposure in the last month:  No Re-experiencing: No None Hypervigilance:  No Hyperarousal: No None Avoidance: No None  Traumatic Brain Injury: No   Past Psychiatric History: Diagnosis: HIstory of Diagnosis of Bipolar I disorder  Hospitalizations: NOne  Outpatient Care: None  Substance Abuse Care: Patient denies  Self-Mutilation: Patient denies  Suicidal Attempts: Patient denies  Violent Behaviors: Patient denies   Past Medical History:   Past Medical History  Diagnosis Date  . Allergic rhinitis   . Asthma   . Emphysema   . Hypertension   . Abnormal heart rhythm   . Partial deafness   . Hyperlipidemia   . Family history of anesthesia complication     TWIN SISTER  HAS DELAYED REACTION   . Shortness of breath   . Cardiomegaly   . Hypothyroidism      SEVERE    History of Loss of Consciousness:  No Seizure History:  Yes Cardiac History:  Yes-HTN  Allergies:   Allergies  Allergen Reactions  . Aspirin     REACTION: asthma flare  . Codeine     REACTION: ulcer   Current Medications:  Current Outpatient Prescriptions  Medication Sig Dispense Refill  . albuterol (PROAIR HFA) 108 (90 BASE) MCG/ACT inhaler INHALE 2 PUFFS INTO THE LUNGS EVERY 6 HOURS AS NEEDED FOR WHEEZING  8.5 g  5  . amLODipine-benazepril (LOTREL) 5-20 MG per capsule Take 1 capsule by mouth every evening.  90 capsule  3  . ARIPiprazole (ABILIFY) 5 MG tablet Take 1 tablet (5 mg total) by mouth 2 (two) times daily.  30 tablet  1  . brimonidine (ALPHAGAN) 0.2 % ophthalmic solution Place 1 drop into both eyes 3 (three) times daily.  5 mL  0  . Fluticasone-Salmeterol (ADVAIR DISKUS) 500-50 MCG/DOSE AEPB Inhale 1 puff into the lungs 2 (two) times daily.  60 each  5  . Loperamide-Simethicone (IMODIUM ADVANCED) 2-125 MG CHEW Chew by mouth as needed.       [name] SYNTHROID 88 MCG tablet TAKE 1 TABLET BY MOUTH DAILY BEFORE BREAKFAST  90 tablet  0   No current facility-administered medications for this visit.    Previous Psychotropic Medications:  Medication   Abilify  Mirtazapine  Trazodone   Substance Abuse History in the last 12 months: Nicotine: Patient denies.  Alcohol: Patient denies.  Illicit Drugs: Patient denies.   Medical Consequences of Substance Abuse: Patient denies.   Legal Consequences of Substance Abuse: Patient denies.   Family Consequences of Substance Abuse: Patient denies.   Blackouts:  No DT's:  No Withdrawal Symptoms:  Negative None   Social History: Current Place of Residence: [place], [place] Place of Birth: [place], [place] Family Members: Lives with her husband and 3 dogs. Daughter lives in [place], [place].  Son lives in [place], [place] Marital Status:  Married-42 years Children: 2  Sons: 1  Daughters: 1 Relationships: Patient reports that  her family is her main source of emotional support Education:  Corporate treasurer Problems/Performance: Patient had difficulty with math. Religious Beliefs/Practices: Yes-prays History of Abuse: none Occupational Experiences: Programmer, multimedia of a newspaper, Hydrographic surveyor; retired. Military History:  None. Legal History: Patient denies. Hobbies/Interests: Patient enjoys spending time with people.  Politically active.  Family History:   Family History  Problem Relation Age of Onset  . Allergies Sister     X2  . Heart disease Mother   . Clotting disorder Mother   . Cancer Mother     Psychiatric Specialty Exam: Objective:  Appearance: Casual and Well Groomed  Eye Contact::  Fair  Speech:  Clear and Coherent and Normal Rate  Volume:  Normal  Mood:  "fine." 5-6/10  (0=Very depressed; 5=Neutral; 10=Very Happy)  Affect:  Constricted  Thought Process:  Coherent, Linear and Logical  Orientation:  Full (Time, Place, and Person)  Thought Content:  WDL  Suicidal Thoughts:  No  Homicidal Thoughts:  No  Judgement:  Fair  Insight:  Good  Psychomotor Activity:  Normal  Akathisia:  No  Memory: 3/3 immediate; 1/3 recent  Handed:  Left  AIMS (if indicated):  As noted  Assets:  Communication Skills Desire for Improvement Financial Resources/Insurance Housing Intimacy Leisure Time Physical Health Resilience Social Support Talents/Skills Transportation  Laboratory/X-Ray Psychological Evaluation(s)   None  None   Assessment:  Mood Disorder due to a general medical condition-(Hypothyroidism and Hyperglycemia)-improving  AXIS I Mood Disorder due to a general medical condition-(Hypothyroidism and Hyperglycemia); Mild Neurocognitive Disorder;  Rule out Bipolar Disorder, NEC,   AXIS II No diagnosis  AXIS III Past Medical History  Diagnosis Date  . Allergic rhinitis   . Asthma   . Emphysema   . Hypertension   . Abnormal heart rhythm   . Partial deafness   . Hyperlipidemia   . Family  history of anesthesia complication     TWIN SISTER HAS DELAYED REACTION   . Shortness of breath   . Cardiomegaly   . Hypothyroidism     SEVERE    Mild neurocognitive disorder due to another medical condition   Treatment Plan/Recommendations:   Plan of Care:  PLAN:  1. Affirm with the patient that the medications are taken as ordered. Patient  expressed understanding of how their medications were to be used.    Laboratory: Will order Thyroid panel, FBS, and FLP  Psychotherapy: Therapy: brief supportive therapy provided.  Discussed psychosocial stressors in detail. More than 50% of the visit was spent on individual therapy/counseling.   Medications:  Continue the following psychiatric medications as written prior to this appointment:  a) Will continue Aripiprazole 5 mg-QHS B) Will consider Aricept or Namenda if patient SLUMS worsens below 20 at next appointment. -Risks and benefits, side effects and alternatives discussed with patient, she was given an opportunity to ask questions about her medication, illness, and treatment. All current psychiatric medications have been reviewed and discussed with the patient and adjusted as clinically appropriate. The patient has been provided an accurate and updated list of the medications being now prescribed.   Routine PRN Medications:  Negative  Consultations: The patient was encouraged to keep all PCP and specialty clinic appointments.   Safety Concerns:   Patient told to call clinic if any problems occur. Patient advised to go to  ER  if she should develop SI/HI, side effects, or if symptoms worsen. Has crisis numbers to call if needed.    Other:   8. Patient was instructed to return to clinic in 2 months  9. The patient was advised to call and cancel their mental health appointment within 24 hours of the appointment, if they are unable to keep the appointment, as well as the three no show and termination from clinic policy. 10. The patient  expressed understanding of the plan and agrees with the above. 11. Patient informed that April 15th, 2015 would be my last day at this clinic.   Time Spent: 30 minutes Jacqulyn Cane, MD 4/2/201512:10 PM

## 2013-09-23 LAB — TSH: TSH: 0.075 u[IU]/mL — ABNORMAL LOW (ref 0.350–4.500)

## 2013-09-23 LAB — LIPID PANEL
CHOL/HDL RATIO: 3.2 ratio
CHOLESTEROL: 232 mg/dL — AB (ref 0–200)
HDL: 72 mg/dL (ref >39)
LDL Cholesterol: 144 mg/dL — ABNORMAL HIGH (ref 0–99)
TRIGLYCERIDES: 78 mg/dL (ref <150)
VLDL: 16 mg/dL (ref 0–40)

## 2013-09-23 LAB — T4, FREE: Free T4: 1.52 ng/dL (ref 0.80–1.80)

## 2013-09-23 LAB — T3, FREE: T3 FREE: 3.4 pg/mL (ref 2.3–4.2)

## 2013-11-16 ENCOUNTER — Ambulatory Visit (INDEPENDENT_AMBULATORY_CARE_PROVIDER_SITE_OTHER): Payer: BC Managed Care – PPO | Admitting: Family Medicine

## 2013-11-16 VITALS — BP 148/58 | HR 62 | Temp 97.6°F | Resp 16 | Ht 59.5 in | Wt 109.2 lb

## 2013-11-16 DIAGNOSIS — M818 Other osteoporosis without current pathological fracture: Secondary | ICD-10-CM

## 2013-11-16 DIAGNOSIS — Z79899 Other long term (current) drug therapy: Secondary | ICD-10-CM

## 2013-11-16 DIAGNOSIS — B370 Candidal stomatitis: Secondary | ICD-10-CM

## 2013-11-16 DIAGNOSIS — M2170 Unequal limb length (acquired), unspecified site: Secondary | ICD-10-CM

## 2013-11-16 DIAGNOSIS — M545 Low back pain, unspecified: Secondary | ICD-10-CM

## 2013-11-16 DIAGNOSIS — J449 Chronic obstructive pulmonary disease, unspecified: Secondary | ICD-10-CM

## 2013-11-16 DIAGNOSIS — E038 Other specified hypothyroidism: Secondary | ICD-10-CM

## 2013-11-16 DIAGNOSIS — E039 Hypothyroidism, unspecified: Secondary | ICD-10-CM

## 2013-11-16 DIAGNOSIS — J45909 Unspecified asthma, uncomplicated: Secondary | ICD-10-CM

## 2013-11-16 DIAGNOSIS — F0630 Mood disorder due to known physiological condition, unspecified: Secondary | ICD-10-CM

## 2013-11-16 DIAGNOSIS — I10 Essential (primary) hypertension: Secondary | ICD-10-CM

## 2013-11-16 DIAGNOSIS — R0600 Dyspnea, unspecified: Secondary | ICD-10-CM

## 2013-11-16 DIAGNOSIS — E785 Hyperlipidemia, unspecified: Secondary | ICD-10-CM

## 2013-11-16 LAB — POCT CBC
Granulocyte percent: 74.4 %G (ref 37–80)
HCT, POC: 45.6 % (ref 37.7–47.9)
Hemoglobin: 14.7 g/dL (ref 12.2–16.2)
Lymph, poc: 2.4 (ref 0.6–3.4)
MCH, POC: 30.4 pg (ref 27–31.2)
MCHC: 32.2 g/dL (ref 31.8–35.4)
MCV: 94.3 fL (ref 80–97)
MID (cbc): 0.6 (ref 0–0.9)
MPV: 8.2 fL (ref 0–99.8)
POC GRANULOCYTE: 8.8 — AB (ref 2–6.9)
POC LYMPH %: 20.5 % (ref 10–50)
POC MID %: 5.1 % (ref 0–12)
Platelet Count, POC: 379 10*3/uL (ref 142–424)
RBC: 4.84 M/uL (ref 4.04–5.48)
RDW, POC: 13.5 %
WBC: 11.8 10*3/uL — AB (ref 4.6–10.2)

## 2013-11-16 LAB — COMPREHENSIVE METABOLIC PANEL
ALBUMIN: 4.6 g/dL (ref 3.5–5.2)
ALK PHOS: 117 U/L (ref 39–117)
ALT: 42 U/L — AB (ref 0–35)
AST: 31 U/L (ref 0–37)
BILIRUBIN TOTAL: 0.4 mg/dL (ref 0.2–1.2)
BUN: 17 mg/dL (ref 6–23)
CO2: 25 mEq/L (ref 19–32)
Calcium: 10 mg/dL (ref 8.4–10.5)
Chloride: 103 mEq/L (ref 96–112)
Creat: 0.64 mg/dL (ref 0.50–1.10)
Glucose, Bld: 110 mg/dL — ABNORMAL HIGH (ref 70–99)
POTASSIUM: 4.2 meq/L (ref 3.5–5.3)
SODIUM: 140 meq/L (ref 135–145)
TOTAL PROTEIN: 7.8 g/dL (ref 6.0–8.3)

## 2013-11-16 LAB — POCT UA - MICROSCOPIC ONLY
CASTS, UR, LPF, POC: NEGATIVE
CRYSTALS, UR, HPF, POC: NEGATIVE
Yeast, UA: NEGATIVE

## 2013-11-16 LAB — LIPID PANEL
Cholesterol: 208 mg/dL — ABNORMAL HIGH (ref 0–200)
HDL: 79 mg/dL (ref >39)
LDL Cholesterol: 109 mg/dL — ABNORMAL HIGH (ref 0–99)
Total CHOL/HDL Ratio: 2.6 Ratio
Triglycerides: 98 mg/dL (ref <150)
VLDL: 20 mg/dL (ref 0–40)

## 2013-11-16 LAB — POCT URINALYSIS DIPSTICK
BILIRUBIN UA: NEGATIVE
Glucose, UA: NEGATIVE
Ketones, UA: NEGATIVE
Nitrite, UA: NEGATIVE
PH UA: 6.5
Protein, UA: NEGATIVE
Spec Grav, UA: 1.01
UROBILINOGEN UA: 0.2

## 2013-11-16 LAB — POCT GLYCOSYLATED HEMOGLOBIN (HGB A1C): Hemoglobin A1C: 5.8

## 2013-11-16 LAB — T4, FREE: FREE T4: 1.46 ng/dL (ref 0.80–1.80)

## 2013-11-16 LAB — GLUCOSE, POCT (MANUAL RESULT ENTRY): POC GLUCOSE: 98 mg/dL (ref 70–99)

## 2013-11-16 LAB — T3, FREE: T3 FREE: 3 pg/mL (ref 2.3–4.2)

## 2013-11-16 LAB — TSH: TSH: 0.176 u[IU]/mL — AB (ref 0.350–4.500)

## 2013-11-16 MED ORDER — NYSTATIN 100000 UNIT/ML MT SUSP: 5.0000 mL | Freq: Four times a day (QID) | OROMUCOSAL | Status: DC

## 2013-11-16 MED ORDER — SYNTHROID 88 MCG PO TABS: ORAL_TABLET | ORAL | Status: DC

## 2013-11-16 NOTE — Progress Notes (Addendum)
Subjective:    Patient ID: Cheryl [name], female    DOB: 02-17-1946, 68 y.o.   MRN: [id number]  HPI This chart was scribed for [name], MD by [name], ED Scribe. The patient was seen in room 3. Patient's care was started at 10:25 AM. Chief Complaint  Patient presents with  . Back Pain    (on and off) 1 week  . Thrush    in her mouth  . Check BS    HPI Comments: Cheryl [name] is a 68 y.o. female who presents to the [facility] complaining of back pain that onset last week while walking [place] the beach. Pt states that she was informed that her L leg is a quarter to half inch difference from her R leg a while ago and used to have a shoe lift which she would like to get again to help. Pain is worse with vacuuming. Pt was there visiting her son and her new grandson!  Pt also presents with a h/o hypothyroidism. Pt's TSH has been suppressed over past several visits so Levothyroxine has been gradually decreased from 112 to 88 micrograms. TSH last checked 2 months prior by psychiatrist were still suppressed at .075. Free T4 and T3 were normal so dose was unchanged.   Pt also has h/o prediabetes which is followed closely since she is on Abilify. HbA1c 5.8 3 months prior.   Pt also has h/o hyperlipidemia, refraining from treatment while thryoid medication is adjusted.   Pt has h/o osteopenia, close to developing osteoporosis. She is due for repeat bone density scan which has not yet been done.  Pt also present with sore throat and hoarseness onset this morning. She reports associated thrushg noted this morning that she suspects is from her Advair inhaler.   Pt also presents with urinary frequency. Pt reports that she gets up at 3-4 times during the night to urinate.   Pt has been married for 43 years. He husband still works.   Past Medical History  Diagnosis Date  . Allergic rhinitis   . Asthma   . Emphysema   . Hypertension   . Abnormal heart rhythm   .  Partial deafness   . Hyperlipidemia   . Family history of anesthesia complication     TWIN SISTER HAS DELAYED REACTION   . Shortness of breath   . Cardiomegaly   . Hypothyroidism     SEVERE    Current Outpatient Prescriptions on File Prior to Visit  Medication Sig Dispense Refill  . albuterol (PROAIR HFA) 108 (90 BASE) MCG/ACT inhaler INHALE 2 PUFFS INTO THE LUNGS EVERY 6 HOURS AS NEEDED FOR WHEEZING  8.5 g  5  . amLODipine-benazepril (LOTREL) 5-20 MG per capsule Take 1 capsule by mouth every evening.  90 capsule  3  . ARIPiprazole (ABILIFY) 5 MG tablet Take 1 tablet (5 mg total) by mouth once.  30 tablet  3  . Fluticasone-Salmeterol (ADVAIR DISKUS) 500-50 MCG/DOSE AEPB Inhale 1 puff into the lungs 2 (two) times daily.  60 each  5  . Loperamide-Simethicone (IMODIUM ADVANCED) 2-125 MG CHEW Chew by mouth as needed.       [name] SYNTHROID 88 MCG tablet TAKE 1 TABLET BY MOUTH DAILY BEFORE BREAKFAST  90 tablet  0  . brimonidine (ALPHAGAN) 0.2 % ophthalmic solution Place 1 drop into both eyes 3 (three) times daily.  5 mL  0   No current facility-administered medications on file prior to visit.  Allergies  Allergen Reactions  . Aspirin     REACTION: asthma flare  . Codeine     REACTION: ulcer   Review of Systems  Constitutional: Negative for fever, chills, activity change, appetite change and unexpected weight change.  HENT: Positive for postnasal drip, sore throat and voice change. Negative for congestion, rhinorrhea, sinus pressure and trouble swallowing.   Cardiovascular: Negative for leg swelling.  Genitourinary: Positive for frequency. Negative for dysuria, decreased urine volume and difficulty urinating.  Musculoskeletal: Positive for arthralgias, back pain and myalgias. Negative for gait problem and joint swelling.  Psychiatric/Behavioral: Positive for sleep disturbance. Negative for behavioral problems, confusion and dysphoric mood. The patient is not nervous/anxious.      BP 148/58   Pulse 62  Temp(Src) 97.6 F (36.4 C) (Oral)  Resp 16  Ht 4' 11.5" (1.511 m)  Wt 109 lb 3.2 oz (49.533 kg)  BMI 21.70 kg/m2  SpO2 96% Objective:   Physical Exam  Nursing note and vitals reviewed. Constitutional: She is oriented to person, place, and time. She appears well-developed and well-nourished. No distress.  HENT:  Head: Normocephalic and atraumatic.  Mouth/Throat:  Very thick, adherent white patches all over tongue, large amount Severe postnasal drip  Eyes: EOM are normal.  Neck: Neck supple.  Cardiovascular: Normal rate and regular rhythm.   Murmur heard.  Decrescendo systolic murmur is present with a grade of 2/6  Pulmonary/Chest: Effort normal and breath sounds normal. No respiratory distress.  Musculoskeletal: Normal range of motion.  Neurological: She is alert and oriented to person, place, and time.  Skin: Skin is warm and dry.  Psychiatric: She has a normal mood and affect. Her behavior is normal.      Results for orders placed in visit on 11/16/13  TSH      Result Value Ref Range   TSH 0.176 (*) 0.350 - 4.500 uIU/mL  COMPREHENSIVE METABOLIC PANEL      Result Value Ref Range   Sodium 140  135 - 145 mEq/L   Potassium 4.2  3.5 - 5.3 mEq/L   Chloride 103  96 - 112 mEq/L   CO2 25  19 - 32 mEq/L   Glucose, Bld 110 (*) 70 - 99 mg/dL   BUN 17  6 - 23 mg/dL   Creat 0.980.64  1.190.50 - 1.471.10 mg/dL   Total Bilirubin 0.4  0.2 - 1.2 mg/dL   Alkaline Phosphatase 117  39 - 117 U/L   AST 31  0 - 37 U/L   ALT 42 (*) 0 - 35 U/L   Total Protein 7.8  6.0 - 8.3 g/dL   Albumin 4.6  3.5 - 5.2 g/dL   Calcium 82.910.0  8.4 - 56.210.5 mg/dL  LIPID PANEL      Result Value Ref Range   Cholesterol 208 (*) 0 - 200 mg/dL   Triglycerides 98  <130<150 mg/dL   HDL 79  >86>39 mg/dL   Total CHOL/HDL Ratio 2.6     VLDL 20  0 - 40 mg/dL   LDL Cholesterol 578109 (*) 0 - 99 mg/dL  T3, FREE      Result Value Ref Range   T3, Free 3.0  2.3 - 4.2 pg/mL  T4, FREE      Result Value Ref Range   Free T4 1.46   0.80 - 1.80 ng/dL  POCT CBC      Result Value Ref Range   WBC 11.8 (*) 4.6 - 10.2 K/uL   Lymph, poc 2.4  0.6 - 3.4   POC LYMPH PERCENT 20.5  10 - 50 %L   MID (cbc) 0.6  0 - 0.9   POC MID % 5.1  0 - 12 %M   POC Granulocyte 8.8 (*) 2 - 6.9   Granulocyte percent 74.4  37 - 80 %G   RBC 4.84  4.04 - 5.48 M/uL   Hemoglobin 14.7  12.2 - 16.2 g/dL   HCT, POC 16.1  09.6 - 47.9 %   MCV 94.3  80 - 97 fL   MCH, POC 30.4  27 - 31.2 pg   MCHC 32.2  31.8 - 35.4 g/dL   RDW, POC 04.5     Platelet Count, POC 379  142 - 424 K/uL   MPV 8.2  0 - 99.8 fL  GLUCOSE, POCT (MANUAL RESULT ENTRY)      Result Value Ref Range   POC Glucose 98  70 - 99 mg/dl  POCT GLYCOSYLATED HEMOGLOBIN (HGB A1C)      Result Value Ref Range   Hemoglobin A1C 5.8    POCT UA - MICROSCOPIC ONLY      Result Value Ref Range   WBC, Ur, HPF, POC 4-8     RBC, urine, microscopic 1-3     Bacteria, U Microscopic 1+     Mucus, UA trace     Epithelial cells, urine per micros 3-8     Crystals, Ur, HPF, POC neg     Casts, Ur, LPF, POC neg     Yeast, UA neg    POCT URINALYSIS DIPSTICK      Result Value Ref Range   Color, UA yellow     Clarity, UA clear     Glucose, UA neg     Bilirubin, UA neg     Ketones, UA neg     Spec Grav, UA 1.010     Blood, UA trace-intact     pH, UA 6.5     Protein, UA neg     Urobilinogen, UA 0.2     Nitrite, UA neg     Leukocytes, UA small (1+)      Assessment & Plan:   Severe hypothyroidism - Plan: POCT CBC, POCT glucose (manual entry), POCT glycosylated hemoglobin (Hb A1C), POCT UA - Microscopic Only, TSH, Comprehensive metabolic panel, Lipid panel, T3, Free, T4, Free, POCT urinalysis dipstick - have been slowly titrating down her synthroid trying to reach normal TSH - other TFTs looking better - need to be very cautious w/ synthroid dosing and maintain tight control and lab surveillance since contributes greatly to her mental status (see prior hospitalization). Requires name-brand only synthroid.  Will decrease from 88 to 75 and recheck again in 6-8 wks.  Other osteoporosis - Plan: POCT CBC, POCT glucose (manual entry), POCT glycosylated hemoglobin (Hb A1C), POCT UA - Microscopic Only, TSH, Comprehensive metabolic panel, Lipid panel, T3, Free, T4, Free, POCT urinalysis dipstick - still has not gotten repeat DEXA as last was borderline osteoporosis > 107yrs prev - order placed again.  Other and unspecified hyperlipidemia - Plan: POCT CBC, POCT glucose (manual entry), POCT glycosylated hemoglobin (Hb A1C), POCT UA - Microscopic Only, TSH, Comprehensive metabolic panel, Lipid panel, T3, Free, T4, Free, POCT urinalysis dipstick - following lowfat diet  Mood disorder in conditions classified elsewhere - Plan: POCT CBC, POCT glucose (manual entry), POCT glycosylated hemoglobin (Hb A1C), POCT UA - Microscopic Only, TSH, Comprehensive metabolic panel, Lipid panel, T3, Free, T4, Free, POCT urinalysis dipstick - following with  [facility] on abilify.  HYPERTENSION - Plan: POCT CBC, POCT glucose (manual entry), POCT glycosylated hemoglobin (Hb A1C), POCT UA - Microscopic Only, TSH, Comprehensive metabolic panel, Lipid panel, T3, Free, T4, Free, POCT urinalysis dipstick - elev today but checks at home and prev ok - recheck at f/u in 6-8 wks.  CANDIDIASIS, ORAL - Plan: POCT CBC, POCT glucose (manual entry), POCT glycosylated hemoglobin (Hb A1C), POCT UA - Microscopic Only, TSH, Comprehensive metabolic panel, Lipid panel, T3, Free, T4, Free, POCT urinalysis dipstick - thrush due to advair, nystatin rx'ed.  ASTHMA  and COPD- Plan: Fluticasone-Salmeterol (ADVAIR DISKUS) 500-50 MCG/DOSE AEPB, POCT CBC, POCT glucose (manual entry), POCT glycosylated hemoglobin (Hb A1C), POCT UA - Microscopic Only, TSH, Comprehensive metabolic panel, Lipid panel, T3, Free, T4, Free, POCT urinalysis dipstick  Lumbago - Plan: POCT CBC, POCT glucose (manual entry), POCT glycosylated hemoglobin (Hb A1C), POCT UA - Microscopic Only,  TSH, Comprehensive metabolic panel, Lipid panel, T3, Free, T4, Free, POCT urinalysis dipstick - wearing support belt  Leg length discrepancy - Plan: POCT CBC, POCT glucose (manual entry), POCT glycosylated hemoglobin (Hb A1C), POCT UA - Microscopic Only, TSH, Comprehensive metabolic panel, Lipid panel, T3, Free, T4, Free, POCT urinalysis dipstick - gave paper rx for left shoe lift 1/4-1/2 in thick per pt. If cont to have lumbar pain, rec referral to ortho or PT for repeat measurement of leg length discrepancy.  Urinary frequency - UA ok today but does have some trace hematuria - recheck at f/u - if clear could consider trial of med.  Murmur - not noted prior. 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 Recheck at f/u.   Meds ordered this encounter  Medications  . nystatin (MYCOSTATIN) 100000 UNIT/ML suspension    Sig: Use as directed 5 mLs (500,000 Units total) in the mouth or throat 4 (four) times daily.    Dispense:  180 mL    Refill:  0  . DISCONTD: SYNTHROID 88 MCG tablet    Sig: TAKE 1 TABLET BY MOUTH DAILY BEFORE BREAKFAST    Dispense:  30 tablet    Refill:  0  . levothyroxine (SYNTHROID) 75 MCG tablet    Sig: Take 1 tablet (75 mcg total) by mouth daily before breakfast.    Dispense:  90 tablet    Refill:  0    Brand name medically necessary. D/C prior 88 mcg dose.  [name] Fluticasone-Salmeterol (ADVAIR DISKUS) 500-50 MCG/DOSE AEPB    Sig: Inhale 1 puff into the lungs 2 (two) times daily.    Dispense:  60 each    Refill:  5    I personally performed the services described in this documentation, which was scribed in my presence. The recorded information has been reviewed and considered, and addended by me as needed.  [name], MD MPH

## 2013-11-17 ENCOUNTER — Encounter: Payer: Self-pay | Admitting: Family Medicine

## 2013-11-17 MED ORDER — LEVOTHYROXINE SODIUM 75 MCG PO TABS: 75.0000 ug | ORAL_TABLET | Freq: Every day | ORAL | Status: DC

## 2013-11-17 MED ORDER — FLUTICASONE-SALMETEROL 500-50 MCG/DOSE IN AEPB: 1.0000 | INHALATION_SPRAY | Freq: Two times a day (BID) | RESPIRATORY_TRACT | Status: DC

## 2013-11-21 NOTE — Progress Notes (Signed)
Patient states she already has an appt scheduled.

## 2013-12-05 ENCOUNTER — Telehealth: Payer: Self-pay

## 2013-12-05 NOTE — Telephone Encounter (Signed)
Reviewed echo from 01/2013 - pt does NOT need antibiotics prior to dental work.

## 2013-12-05 NOTE — Telephone Encounter (Signed)
Hmm, I am not aware of any reason pt needs pretreatment with antibiotics prior to dental procedure and did not see anything on brief review of chart.  I would trust the dentist - as long as she has told him about her medical history and does not think she needs antibiotic that should be fine.

## 2013-12-05 NOTE — Telephone Encounter (Signed)
Maralyn SagoSarah from [facility] called in regard to out suggestion that pt have premed for dental procedure, their physicians disagree, and would like to get some clarification from Dr.Shaw.

## 2013-12-05 NOTE — Telephone Encounter (Signed)
Pt has to have a dental procedure done and advised the dentist that she has to have medication (antibiotic) prior to having any dental work done because she has a heart valve prolapse.  I do not see where that would be indicated. She does not have any prosthetic heart valves. Please advise.

## 2013-12-06 ENCOUNTER — Ambulatory Visit (INDEPENDENT_AMBULATORY_CARE_PROVIDER_SITE_OTHER): Payer: BC Managed Care – PPO

## 2013-12-06 ENCOUNTER — Encounter: Payer: Self-pay | Admitting: Family Medicine

## 2013-12-06 ENCOUNTER — Ambulatory Visit (INDEPENDENT_AMBULATORY_CARE_PROVIDER_SITE_OTHER): Payer: BC Managed Care – PPO | Admitting: Family Medicine

## 2013-12-06 VITALS — BP 130/59 | HR 55 | Temp 98.1°F | Resp 16 | Ht 60.5 in | Wt 108.0 lb

## 2013-12-06 DIAGNOSIS — R740 Nonspecific elevation of levels of transaminase and lactic acid dehydrogenase [LDH]: Secondary | ICD-10-CM

## 2013-12-06 DIAGNOSIS — M84376A Stress fracture, unspecified foot, initial encounter for fracture: Secondary | ICD-10-CM

## 2013-12-06 DIAGNOSIS — M25579 Pain in unspecified ankle and joints of unspecified foot: Secondary | ICD-10-CM

## 2013-12-06 DIAGNOSIS — R7401 Elevation of levels of liver transaminase levels: Secondary | ICD-10-CM

## 2013-12-06 DIAGNOSIS — M25572 Pain in left ankle and joints of left foot: Secondary | ICD-10-CM

## 2013-12-06 DIAGNOSIS — Z23 Encounter for immunization: Secondary | ICD-10-CM

## 2013-12-06 DIAGNOSIS — R748 Abnormal levels of other serum enzymes: Secondary | ICD-10-CM

## 2013-12-06 DIAGNOSIS — M84375A Stress fracture, left foot, initial encounter for fracture: Secondary | ICD-10-CM

## 2013-12-06 LAB — HEPATIC FUNCTION PANEL
ALT: 15 U/L (ref 0–35)
AST: 19 U/L (ref 0–37)
Albumin: 4.1 g/dL (ref 3.5–5.2)
Alkaline Phosphatase: 119 U/L — ABNORMAL HIGH (ref 39–117)
BILIRUBIN DIRECT: 0.1 mg/dL (ref 0.0–0.3)
BILIRUBIN INDIRECT: 0.1 mg/dL — AB (ref 0.2–1.2)
Total Bilirubin: 0.2 mg/dL (ref 0.2–1.2)
Total Protein: 7.2 g/dL (ref 6.0–8.3)

## 2013-12-06 LAB — POCT CBC
Granulocyte percent: 72 %G (ref 37–80)
HCT, POC: 41.1 % (ref 37.7–47.9)
HEMOGLOBIN: 13.1 g/dL (ref 12.2–16.2)
Lymph, poc: 2.1 (ref 0.6–3.4)
MCH, POC: 29.8 pg (ref 27–31.2)
MCHC: 31.9 g/dL (ref 31.8–35.4)
MCV: 93.6 fL (ref 80–97)
MID (CBC): 0.5 (ref 0–0.9)
MPV: 8.9 fL (ref 0–99.8)
PLATELET COUNT, POC: 406 10*3/uL (ref 142–424)
POC Granulocyte: 6.9 (ref 2–6.9)
POC LYMPH PERCENT: 22.3 %L (ref 10–50)
POC MID %: 5.7 % (ref 0–12)
RBC: 4.39 M/uL (ref 4.04–5.48)
RDW, POC: 14.5 %
WBC: 9.6 10*3/uL (ref 4.6–10.2)

## 2013-12-06 LAB — POCT SEDIMENTATION RATE: POCT SED RATE: 29 mm/h — AB (ref 0–22)

## 2013-12-06 LAB — URIC ACID: URIC ACID, SERUM: 5.7 mg/dL (ref 2.4–7.0)

## 2013-12-06 LAB — C-REACTIVE PROTEIN: CRP: <0.5 mg/dL (ref <0.60)

## 2013-12-06 NOTE — Progress Notes (Addendum)
This chart was scribed for Cheryl MochaEva N Shaw, MD by Luisa DagoPriscilla Tutu, ED Scribe. This patient was seen in room 26 and the patient's care was started at 9:20 AM.  Subjective:    Patient ID: Cheryl [name], female    DOB: 10/13/1945, 68 y.o.   MRN: [id number]  Chief Complaint  Patient presents with  . Foot Swelling    Left x 1 mth  . Lab work    TSH    HPI HPI Comments: Cheryl DarbyDonna E Gul is a 68 y.o. female who presents to the [facility] with her husband for a follow up visit  Today, pt is complaining of left foot swelling that started approximately one month ago. Pt does have a history of bunion surgery.   She is also here to do lab work for her TSH.  However, it has only been several weeks since we last adjusted her medication.  Pt came in for evaluation of back pain 3 weeks ago which was attributed to a leg discrepancy. She was given a shoe lift which she is wearing and it has helped her back pain. Pt states that her back is doing well.  She had labs done at that and her hemoglobin A1C  was stable at 5.8.  TSH was still slightly elevated 0.176 though improved from prior, so her Levothyroxine was decreased. Pt does need to be on the brand name medication.  Her ALT was mildly elevated and recommended for a recheck upon her follow up.  Her cholesterol continued to improve LDL was 109.  Pt states that she went to her eye doctor and she was told that she has the beginnings of Glaucoma and cataracts.   Patient Active Problem List   Diagnosis Date Noted  . Osteopenia 02/15/2013  . Mood disorder in conditions classified elsewhere 02/05/2013  . Severe hypothyroidism 01/24/2013  . 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 Altered mental status 01/24/2013  . Other and unspecified hyperlipidemia 01/24/2013  . CANDIDIASIS, ORAL 03/06/2009  . OTHER OSTEOPOROSIS 06/10/2008  . HYPERTENSION 05/09/2008  . ALLERGIC RHINITIS 05/09/2008  . ASTHMA 05/09/2008   Past Medical History    Diagnosis Date  . Allergic rhinitis   . Asthma   . Emphysema   . Hypertension   . Abnormal heart rhythm   . Partial deafness   . Hyperlipidemia   . Family history of anesthesia complication     TWIN SISTER HAS DELAYED REACTION   . Shortness of breath   . Cardiomegaly   . Hypothyroidism     SEVERE    Past Surgical History  Procedure Laterality Date  . Cholecystectomy    . Back surgery    . Nasal sinus surgery    . Knee surgery    . Tubal ligation    . Ear operations    . 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 Abdominal hysterectomy    . Thyroidectomy      Uncertain date   Allergies  Allergen Reactions  . Aspirin     REACTION: asthma flare  . Codeine     REACTION: ulcer   Prior to Admission medications   Medication Sig Start Date End Date Taking? Authorizing Provider  albuterol (PROAIR HFA) 108 (90 BASE) MCG/ACT inhaler INHALE 2 PUFFS INTO THE LUNGS EVERY 6 HOURS AS NEEDED FOR WHEEZING 06/24/13   Cheryl MochaEva N Shaw, MD  amLODipine-benazepril (LOTREL) 5-20 MG per capsule Take 1 capsule by mouth every  evening. 06/07/13   Cheryl MochaEva N Shaw, MD  ARIPiprazole (ABILIFY) 5 MG tablet Take 1 tablet (5 mg total) by mouth once. 09/19/13   Larena SoxShaji J Puthuvel, MD  brimonidine (ALPHAGAN) 0.2 % ophthalmic solution Place 1 drop into both eyes 3 (three) times daily. 01/29/13   Cheryl MochaEva N Shaw, MD  Fluticasone-Salmeterol (ADVAIR DISKUS) 500-50 MCG/DOSE AEPB Inhale 1 puff into the lungs 2 (two) times daily. 11/17/13   Cheryl MochaEva N Shaw, MD  levothyroxine (SYNTHROID) 75 MCG tablet Take 1 tablet (75 mcg total) by mouth daily before breakfast. 11/17/13   Cheryl MochaEva N Shaw, MD  Loperamide-Simethicone (IMODIUM ADVANCED) 2-125 MG CHEW Chew by mouth as needed.     Historical Provider, MD  nystatin (MYCOSTATIN) 100000 UNIT/ML suspension Use as directed 5 mLs (500,000 Units total) in the mouth or throat 4 (four) times daily. 11/16/13   Cheryl MochaEva N Shaw, MD   History   Social History  . Marital Status: Married    Spouse Name: N/A    Number of  Children: N/A  . Years of Education: N/A   Occupational History  . Not on file.   Social History Main Topics  . Smoking status: Never Smoker   . Smokeless tobacco: Never Used     Comment: EXPOSED TO SECOND HAND SMOKE AS CHILD  . Alcohol Use: No  . Drug Use: No  . Sexual Activity: Not on file   Other Topics Concern  . Not on file   Social History Narrative  . No narrative on file   Review of Systems  Constitutional: Negative for fatigue and unexpected weight change.  Respiratory: Negative for chest tightness and shortness of breath.   Cardiovascular: Negative for chest pain, palpitations and leg swelling.  Gastrointestinal: Negative for abdominal pain and blood in stool.  Musculoskeletal: Positive for arthralgias and joint swelling.  Neurological: Negative for dizziness, syncope, light-headedness and headaches.       Objective:   Physical Exam  Nursing note and vitals reviewed. Constitutional: She is oriented to person, place, and time. She appears well-developed and well-nourished. No distress.  HENT:  Head: Normocephalic and atraumatic.  Right Ear: External ear normal.  Left Ear: External ear normal.  Nose: Nose normal.  Mouth/Throat: Oropharynx is clear and moist. No oropharyngeal exudate.  Eyes: Conjunctivae and EOM are normal.  Neck: Normal range of motion. Neck supple. No thyromegaly present.  Cardiovascular: Normal rate and regular rhythm.  Exam reveals no gallop and no friction rub.   Murmur heard.  Decrescendo systolic murmur is present with a grade of 2/6  Pulmonary/Chest: Effort normal and breath sounds normal. No respiratory distress. She has no decreased breath sounds. She has no wheezes. She has no rhonchi. She has no rales. She exhibits no tenderness.  Musculoskeletal: Normal range of motion. She exhibits edema and tenderness.  Pain and swelling to distal 3rd to 5th metatarsal in dorsal aspect of midfoot. Pain on plantar aspect of her distal metatarsal. 2+  pedal pulse.  Neurological: She is alert and oriented to person, place, and time.  Skin: Skin is warm and dry.  Psychiatric: She has a normal mood and affect. Her behavior is normal.   [facility] reading (PRIMARY) X-Ray reading of left foot by  Dr. Clelia CroftShaw. --middle articact of distal first metatarsal ? Previous bunion surgery. ? Stress fracture in distal third metatarsal.   EXAM: LEFT FOOT - COMPLETE 3+ VIEW  COMPARISON: Report only from prior examination dated 06/03/1999.  FINDINGS: The bones are diffusely demineralized. There are postsurgical  changes within the first metatarsal neck with a K-wire. Underlying degenerative changes of the first metatarsal phalangeal joint are noted. There are age indeterminate stress fractures involving the second and third metatarsal necks. There is also posttraumatic deformity of the distal fifth metatarsal, likely related to an old healed fracture. The toes appear intact. The tarsal bones appear intact.  IMPRESSION: Age indeterminate stress fractures of the second and third metatarsal necks. Additional described findings appear nonacute.    10:32 AM--Will refer pt to a pediatrist for the stress fracture.   Filed Vitals:   12/06/13 0906  BP: 130/59  Pulse: 55  Temp: 98.1 F (36.7 C)  TempSrc: Oral  Resp: 16  Height: 5' 0.5" (1.537 m)  Weight: 108 lb (48.988 kg)  SpO2: 98%       Results for orders placed in visit on 12/06/13  HEPATIC FUNCTION PANEL      Result Value Ref Range   Total Bilirubin 0.2  0.2 - 1.2 mg/dL   Bilirubin, Direct 0.1  0.0 - 0.3 mg/dL   Indirect Bilirubin 0.1 (*) 0.2 - 1.2 mg/dL   Alkaline Phosphatase 119 (*) 39 - 117 U/L   AST 19  0 - 37 U/L   ALT 15  0 - 35 U/L   Total Protein 7.2  6.0 - 8.3 g/dL   Albumin 4.1  3.5 - 5.2 g/dL  C-REACTIVE PROTEIN      Result Value Ref Range   CRP <0.5  <0.60 mg/dL  URIC ACID      Result Value Ref Range   Uric Acid, Serum 5.7  2.4 - 7.0 mg/dL  POCT SEDIMENTATION RATE       Result Value Ref Range   POCT SED RATE 29 (*) 0 - 22 mm/hr    Assessment & Plan:   Pain in joint, ankle and foot, left - Plan: DG Foot Complete Left, POCT CBC, POCT SEDIMENTATION RATE, C-reactive protein, Uric Acid  Abnormal transaminases - Plan: Hepatic Function Panel - pt had new elevation of alt w/ last labs - no etoh, no sig tylenol - recheck today.  Stress fracture of metatarsal bone, left, initial encounter - Plan: Ambulatory referral to Podiatry Placed in post-op shoe, RICE.  Pt reminded that she really needs to schedule DEXA scan - gave phone # to do.  Hypothyroidism - synthroid (needs brand name) was decreased 3 wks prev - to soon to recheck - recheck at f/u in 1 mo.  Meds ordered this encounter  Medications  . penicillin v potassium (VEETID) 500 MG tablet    Sig: Take 500 mg by mouth 4 (four) times daily. For root canal procedure  Above antibiotic from dentist - pt does not need pretreatment for medical/cardiac reasons prior to dental procedure  I personally performed the services described in this documentation, which was scribed in my presence. The recorded information has been reviewed and considered, and addended by me as needed.  [name], MD MPH   Over 40 minutes spent in face-to-face evaluation and consultation with pt

## 2013-12-06 NOTE — Patient Instructions (Addendum)
Please call the [facility] at [facility] to schedule your bone density test.  [address], [place], [place] [address]  [phone]   We will see you back at the end of July to check on your thyroid.  Ok to take tylenol as needed for pain and wear the post-op shoe at all times.  Metatarsal Stress Fracture When too much stress is put on the foot, as in running and jumping sports, the center shaft of the bones of the forefoot is very susceptible to stress fractures (break in bone). This is because of repetitive stress on the bone. This injury is more common if osteoporosis is present or if inadequate running shoes are used. Rapid increase in running distances are often the cause. Running distances should be gradually increased to avoid this problem. Shoes should be used which adequately cushion the foot. Shoes should absorb the shocks of the activity.  DIAGNOSIS  Usually the diagnosis is made by history. The foot progressively becomes sorer with activities. X-rays may be negative (show no break) within the first 2 to 3 weeks of the beginning of pain. A later X-ray may show signs of healing bone (callus formation). A bone scan or MRI will usually make the diagnosis earlier. TREATMENT AND HOME CARE INSTRUCTIONS  Treatment may or may not include a cast, removable fracture boot, or walking shoe. Casts are used for short periods of time to prevent muscle atrophy (muscle wasting).  Activities should be stopped until further advised by your caregiver.  Wear shoes with adequate shock absorbing abilities.  Alternative exercise may be undertaken while waiting for healing. These may include bicycling and swimming, or as your caregiver suggests. If you do not have a cast or splint:  You may walk on your injured foot as tolerated or advised.  Do not put any weight on your injured foot for as long as directed by your caregiver. Slowly increase the amount of time you walk on the foot as  the pain allows or as advised.  Use crutches until you can bear weight without pain. A gradual increase in weight bearing may help.  Apply ice to the injury for 15-20 minutes each hour while awake for the first 2 days. Put the ice in a plastic bag and place a towel between the bag of ice and your skin.  Only take over-the-counter or prescription medicines for pain, discomfort, or fever as directed by your caregiver. SEEK IMMEDIATE MEDICAL CARE IF:   Pain is becoming worse rather than better, or if pain is uncontrolled with medications.  You have increased swelling or redness in the foot. MAKE SURE YOU:   Understand these instructions.  Will watch your condition.  Will get help right away if you are not doing well or get worse. Document Released: 06/03/2000 Document Revised: 08/29/2011 Document Reviewed: 04/01/2008 [facility] Patient Information 2015 [place], [place]. This information is not intended to replace advice given to you by your health care provider. Make sure you discuss any questions you have with your health care provider.    Tetanus, Diphtheria, Pertussis (Tdap) Vaccine What You Need to Know WHY GET VACCINATED? Tetanus, diphtheria and pertussis can be very serious diseases, even for adolescents and adults. Tdap vaccine can protect [place] from these diseases. TETANUS (Lockjaw) causes painful muscle tightening and stiffness, usually all over the body.  It can lead to tightening of muscles in the head and neck so you can't open your mouth, swallow, or sometimes even breathe. Tetanus  kills about 1 out of 5 people who are infected. DIPHTHERIA can cause a thick coating to form in the back of the throat.  It can lead to breathing problems, paralysis, heart failure, and death. PERTUSSIS (Whooping Cough) causes severe coughing spells, which can cause difficulty breathing, vomiting and disturbed sleep.  It can also lead to weight loss, incontinence, and rib fractures. Up to 2 in 100  adolescents and 5 in 100 adults with pertussis are hospitalized or have complications, which could include pneumonia and death. These diseases are caused by bacteria. Diphtheria and pertussis are spread from person to person through coughing or sneezing. Tetanus enters the body through cuts, scratches, or wounds. Before vaccines, the [place] saw as many as 200,000 cases a year of diphtheria and pertussis, and hundreds of cases of tetanus. Since vaccination began, tetanus and diphtheria have dropped by about 99% and pertussis by about 80%. TDAP VACCINE Tdap vaccine can protect adolescents and adults from tetanus, diphtheria, and pertussis. One dose of Tdap is routinely given at age 49 or 24. People who did not get Tdap at that age should get it as soon as possible. Tdap is especially important for health care professionals and anyone having close contact with a baby younger than 12 months. Pregnant women should get a dose of Tdap during every pregnancy, to protect the newborn from pertussis. Infants are most at risk for severe, life-threatening complications from pertussis. A similar vaccine, called Td, protects from tetanus and diphtheria, but not pertussis. A Td booster should be given every 10 years. Tdap may be given as one of these boosters if you have not already gotten a dose. Tdap may also be given after a severe cut or burn to prevent tetanus infection. Your doctor can give you more information. Tdap may safely be given at the same time as other vaccines. SOME PEOPLE SHOULD NOT GET THIS VACCINE  If you ever had a life-threatening allergic reaction after a dose of any tetanus, diphtheria, or pertussis containing vaccine, OR if you have a severe allergy to any part of this vaccine, you should not get Tdap. Tell your doctor if you have any severe allergies.  If you had a coma, or long or multiple seizures within 7 days after a childhood dose of DTP or DTaP, you should not get Tdap, unless a  cause other than the vaccine was found. You can still get Td.  Talk to your doctor if you:  have epilepsy or another nervous system problem,  had severe pain or swelling after any vaccine containing diphtheria, tetanus or pertussis,  ever had Guillain-Barr Syndrome (GBS),  aren't feeling well on the day the shot is scheduled. RISKS OF A VACCINE REACTION With any medicine, including vaccines, there is a chance of side effects. These are usually mild and go away on their own, but serious reactions are also possible. Brief fainting spells can follow a vaccination, leading to injuries from falling. Sitting or lying down for about 15 minutes can help prevent these. Tell your doctor if you feel dizzy or light-headed, or have vision changes or ringing in the ears. Mild problems following Tdap (Did not interfere with activities)  Pain where the shot was given (about 3 in 4 adolescents or 2 in 3 adults)  Redness or swelling where the shot was given (about 1 person in 5)  Mild fever of at least 100.56F (up to about 1 in 25 adolescents or 1 in 100 adults)  Headache (about 3 or  4 people in 10)  Tiredness (about 1 person in 3 or 4)  Nausea, vomiting, diarrhea, stomach ache (up to 1 in 4 adolescents or 1 in 10 adults)  Chills, body aches, sore joints, rash, swollen glands (uncommon) Moderate problems following Tdap (Interfered with activities, but did not require medical attention)  Pain where the shot was given (about 1 in 5 adolescents or 1 in 100 adults)  Redness or swelling where the shot was given (up to about 1 in 16 adolescents or 1 in 25 adults)  Fever over 102F (about 1 in 100 adolescents or 1 in 250 adults)  Headache (about 3 in 20 adolescents or 1 in 10 adults)  Nausea, vomiting, diarrhea, stomach ache (up to 1 or 3 people in 100)  Swelling of the entire arm where the shot was given (up to about 3 in 100). Severe problems following Tdap (Unable to perform usual activities,  required medical attention)  Swelling, severe pain, bleeding and redness in the arm where the shot was given (rare). A severe allergic reaction could occur after any vaccine (estimated less than 1 in a million doses). WHAT IF THERE IS A SERIOUS REACTION? What should I look for?  Look for anything that concerns you, such as signs of a severe allergic reaction, very high fever, or behavior changes. Signs of a severe allergic reaction can include hives, swelling of the face and throat, difficulty breathing, a fast heartbeat, dizziness, and weakness. These would start a few minutes to a few hours after the vaccination. What should I do?  If you think it is a severe allergic reaction or other emergency that can't wait, call 9-1-1 or get the person to the nearest hospital. Otherwise, call your doctor.  Afterward, the reaction should be reported to the "Vaccine Adverse Event Reporting System" (VAERS). Your doctor might file this report, or you can do it yourself through the VAERS web site at www.vaers.LAgents.nohhs.gov, or by calling 1-(365)363-9114. VAERS is only for reporting reactions. They do not give medical advice.  THE NATIONAL VACCINE INJURY COMPENSATION PROGRAM The National Vaccine Injury Compensation Program (VICP) is a federal program that was created to compensate people who may have been injured by certain vaccines. Persons who believe they may have been injured by a vaccine can learn about the program and about filing a claim by calling 1-(608)618-8732 or visiting the VICP website at SpiritualWord.atwww.hrsa.gov/vaccinecompensation. HOW CAN I LEARN MORE?  Ask your doctor.  Call your local or state health department.  Contact the Centers for Disease Control and Prevention (CDC):  Call 94920049191-478-566-5512 or visit CDC's website at PicCapture.uywww.cdc.gov/vaccines. CDC Tdap Vaccine VIS (10/27/11) Document Released: 12/06/2011 Document Revised: 10/01/2012 Document Reviewed: 09/26/2012 [employer] Patient Information 2015 [employer],  [employer]. This information is not intended to replace advice given to you by your health care provider. Make sure you discuss any questions you have with your health care provider.   Pneumococcal Conjugate Vaccine What You Need to Know Your doctor recommends that you, or your child, get a dose of PCV13 vaccine today. WHY GET VACCINATED? Pneumococcal conjugate vaccine (called PCV13 or Prevnar 13) is recommended to protect infants and toddlers, and some older children and adults with certain health conditions, from pneumococcal disease. Pneumococcal disease is caused by infection with Streptococcus pneumoniae bacteria. These bacteria can spread from person to person through close contact. Pneumococcal disease can lead to severe health problems, including pneumonia, blood infections, and meningitis. Meningitis is an infection of the covering of the brain. Pneumococcal meningitis is  fairly rare (less than 1 case per 100,000 people each year), but it leads to other health problems, including deafness and brain damage. In children, it is fatal in about 1 case out of 10. Children younger than two are at higher risk for serious disease than older children. People with certain medical conditions, people over age 69, and cigarette smokers are also at higher risk. Before vaccine, pneumococcal infections caused many problems each year in the [place] in children younger than 5, including:  more than 700 cases of meningitis,  13,000 blood infections,  about 5 million ear infections, and  about 200 deaths. About 4,000 adults still die each year because of pneumococcal infections. Pneumococcal infections can be hard to treat because some strains are resistant to antibiotics. This makes prevention through vaccination even more important. PCV13 VACCINE There are more than 90 types of pneumococcal bacteria. PCV13 protects against 13 of them. These 13 strains cause most severe infections in children and about  half of infections in adults.  PCV13 is routinely given to children at 2, 4, 6, and 6612-1915 months of age. Children in this age range are at greatest risk for serious diseases caused by pneumococcal infection. PCV13 vaccine may also be recommended for some older children or adults. Your doctor can give you details. A second type of pneumococcal vaccine, called PPSV23, may also be given to some children and adults, including anyone over age 69. There is a separate Vaccine Information Statement for this vaccine. PRECAUTIONS  Anyone who has ever had a life-threatening allergic reaction to a dose of this vaccine, to an earlier pneumococcal vaccine called PCV7 (or Prevnar), or to any vaccine containing diphtheria toxoid (for example, DTaP), should not get PCV13. Anyone with a severe allergy to any component of PCV13 should not get the vaccine. Tell your doctor if the person being vaccinated has any severe allergies. If the person scheduled for vaccination is sick, your doctor might decide to reschedule the shot on another day. Your doctor can give you more information about any of these precautions. RISKS  With any medicine, including vaccines, there is a chance of side effects. These are usually mild and go away on their own, but serious reactions are also possible. Reported problems associated with PCV13 vary by dose and age, but generally:  About half of children became drowsy after the shot, had a temporary loss of appetite, or had redness or tenderness where the shot was given.  About 1 out of 3 had swelling where the shot was given.  About 1 out of 3 had a mild fever, and about 1 in 20 had a higher fever (over 102.2 F or 39 C).  Up to about 8 out of 10 became fussy or irritable. Adults receiving the vaccine have reported redness, pain, and swelling where the shot was given. Mild fever, fatigue, headache, chills, or muscle pain have also been reported. Life-threatening allergic reactions from  any vaccine are very rare. WHAT IF THERE IS A SERIOUS REACTION? What should I look for? Look for anything that concerns you, such as signs of a severe allergic reaction, very high fever, or behavior changes. Signs of a severe allergic reaction can include hives, swelling of the face and throat, difficulty breathing, a fast heartbeat, dizziness, and weakness. These would start a few minutes to a few hours after the vaccination. What should I do?  If you think it is a severe allergic reaction or other emergency that can't wait, get the person  to the nearest hospital or call 9-1-1. Otherwise, call your doctor.  Afterward, the reaction should be reported to the "Vaccine Adverse Event Reporting System" (VAERS). Your doctor might file this report, or you can do it yourself through the VAERS web site at www.vaers.LAgents.no, or by calling 1-(352)617-7719. VAERS is only for reporting reactions. They do not give medical advice. THE NATIONAL VACCINE INJURY COMPENSATION PROGRAM The National Vaccine Injury Compensation Program (VICP) was created in 1986. Persons who believe they may have been injured by a vaccine can learn about the program and about filing a claim by calling 1-508-876-7079 or visiting the VICP website at SpiritualWord.at. HOW CAN I LEARN MORE?  Ask your doctor.  Call your local or state health department.  Contact the Centers for Disease Control and Prevention (CDC):  Call 620 007 7787 (1-800-CDC-INFO) or  Visit CDC's website at PicCapture.uy CDC PCV13 Vaccine VIS (Interim) (08/17/11) Document Released: 04/03/2006 Document Revised: 10/01/2012 Document Reviewed: 09/26/2012 ExitCare Patient Information 2015 [place], [place]. This information is not intended to replace advice given to you by your health care provider. Make sure you discuss any questions you have with your health care provider.

## 2013-12-08 NOTE — Telephone Encounter (Signed)
Called and LMOM at dental office with Dr Clelia CroftShaw message.

## 2013-12-10 ENCOUNTER — Other Ambulatory Visit: Payer: Self-pay | Admitting: Family Medicine

## 2013-12-10 DIAGNOSIS — Z1231 Encounter for screening mammogram for malignant neoplasm of breast: Secondary | ICD-10-CM

## 2013-12-16 ENCOUNTER — Ambulatory Visit (HOSPITAL_COMMUNITY)
Admission: RE | Admit: 2013-12-16 | Discharge: 2013-12-16 | Disposition: A | Discharge status: Home / Self Care | Payer: BC Managed Care – PPO | Source: Ambulatory Visit | Attending: Family Medicine | Admitting: Family Medicine

## 2013-12-16 DIAGNOSIS — M949 Disorder of cartilage, unspecified: Secondary | ICD-10-CM

## 2013-12-16 DIAGNOSIS — M818 Other osteoporosis without current pathological fracture: Secondary | ICD-10-CM

## 2013-12-16 DIAGNOSIS — Z13820 Encounter for screening for osteoporosis: Secondary | ICD-10-CM | POA: Insufficient documentation

## 2013-12-16 DIAGNOSIS — M899 Disorder of bone, unspecified: Secondary | ICD-10-CM | POA: Insufficient documentation

## 2013-12-16 DIAGNOSIS — Z780 Asymptomatic menopausal state: Secondary | ICD-10-CM | POA: Insufficient documentation

## 2013-12-18 ENCOUNTER — Encounter: Payer: Self-pay | Admitting: Family Medicine

## 2013-12-26 ENCOUNTER — Encounter: Payer: Self-pay | Admitting: Family Medicine

## 2013-12-26 DIAGNOSIS — M810 Age-related osteoporosis without current pathological fracture: Secondary | ICD-10-CM | POA: Insufficient documentation

## 2014-01-08 ENCOUNTER — Ambulatory Visit (INDEPENDENT_AMBULATORY_CARE_PROVIDER_SITE_OTHER): Payer: BC Managed Care – PPO

## 2014-01-08 ENCOUNTER — Ambulatory Visit (INDEPENDENT_AMBULATORY_CARE_PROVIDER_SITE_OTHER): Payer: BC Managed Care – PPO | Admitting: Podiatry

## 2014-01-08 ENCOUNTER — Encounter: Payer: Self-pay | Admitting: Podiatry

## 2014-01-08 VITALS — BP 121/81 | HR 73 | Resp 16 | Ht 60.0 in | Wt 109.0 lb

## 2014-01-08 DIAGNOSIS — M8448XA Pathological fracture, other site, initial encounter for fracture: Secondary | ICD-10-CM

## 2014-01-08 DIAGNOSIS — S92309A Fracture of unspecified metatarsal bone(s), unspecified foot, initial encounter for closed fracture: Secondary | ICD-10-CM

## 2014-01-08 NOTE — Progress Notes (Signed)
   Subjective:    Patient ID: Cheryl DarbyDonna E [name], female    DOB: 08/31/1945, 68 y.o.   MRN: [id number]  HPI Comments: i have two fractures  In my left foot and my doctor wanted me to have it checked out , it was diagnosed about a month ago and it happened before that , i was walking around with a swollen foot for about a month before.     Review of Systems  All other systems reviewed and are negative.      Objective:   Physical Exam        Assessment & Plan:

## 2014-01-09 NOTE — Progress Notes (Signed)
Subjective:     Patient ID: Cheryl [name], female   DOB: 05/11/1946, 68 y.o.   MRN: [id number]  HPI patient states she has been diagnosed with fractures in her left foot that she is concerned about and referred by her family doctor. States they're improving but she wants to get them looked at   Review of Systems  All other systems reviewed and are negative.      Objective:   Physical Exam  Nursing note and vitals reviewed. Constitutional: She is oriented to person, place, and time.  Cardiovascular: Intact distal pulses.   Musculoskeletal: Normal range of motion.  Neurological: She is oriented to person, place, and time.  Skin: Skin is warm and dry.   neurovascular status is intact with muscle strength adequate and range of motion mildly reduced in the subtalar joint. Patient's found to have mild discomfort in the distal metatarsal shafts 2 and 3 left with swelling but is able to wear shoe gear with not significant limp pattern noted. Patient has diminished arch height and is noted to have good digital perfusion     Assessment:     Probable healing fractures of the left foot with moderate forefoot structural deformity    Plan:     Reviewed x-rays and condition and advised on supportive shoe gear and that it is possible for repeat fractures to occur but at this time she appears to be healing well reappoint as needed

## 2014-01-11 ENCOUNTER — Other Ambulatory Visit: Payer: Self-pay | Admitting: Family Medicine

## 2014-01-11 ENCOUNTER — Telehealth: Payer: Self-pay

## 2014-01-11 MED ORDER — ALBUTEROL SULFATE HFA 108 (90 BASE) MCG/ACT IN AERS: INHALATION_SPRAY | RESPIRATORY_TRACT | Status: DC

## 2014-01-11 NOTE — Telephone Encounter (Signed)
Patient calling again for refill on Proair.   [facility] [address]  Please call and advise patient that rx sent to pharmacy.

## 2014-01-11 NOTE — Telephone Encounter (Signed)
Patient requesting refill on her her "ProAir HFA". Patient is leaving to go to the beach tomorrow morning and states she is completely out. [facility] [address] Patient's call back number is [phone]

## 2014-01-12 NOTE — Telephone Encounter (Signed)
Patient notified and voiced understanding.

## 2014-02-20 ENCOUNTER — Other Ambulatory Visit: Payer: Self-pay | Admitting: Family Medicine

## 2014-02-28 ENCOUNTER — Ambulatory Visit (INDEPENDENT_AMBULATORY_CARE_PROVIDER_SITE_OTHER): Payer: BC Managed Care – PPO | Admitting: Family Medicine

## 2014-02-28 ENCOUNTER — Encounter: Payer: Self-pay | Admitting: Family Medicine

## 2014-02-28 VITALS — BP 126/62 | HR 83 | Temp 98.3°F | Resp 16 | Ht 60.5 in | Wt 108.8 lb

## 2014-02-28 DIAGNOSIS — J449 Chronic obstructive pulmonary disease, unspecified: Secondary | ICD-10-CM

## 2014-02-28 DIAGNOSIS — R0989 Other specified symptoms and signs involving the circulatory and respiratory systems: Secondary | ICD-10-CM

## 2014-02-28 DIAGNOSIS — J45909 Unspecified asthma, uncomplicated: Secondary | ICD-10-CM

## 2014-02-28 DIAGNOSIS — E039 Hypothyroidism, unspecified: Secondary | ICD-10-CM

## 2014-02-28 DIAGNOSIS — M810 Age-related osteoporosis without current pathological fracture: Secondary | ICD-10-CM

## 2014-02-28 DIAGNOSIS — R0609 Other forms of dyspnea: Secondary | ICD-10-CM

## 2014-02-28 DIAGNOSIS — R0600 Dyspnea, unspecified: Secondary | ICD-10-CM

## 2014-02-28 DIAGNOSIS — E038 Other specified hypothyroidism: Secondary | ICD-10-CM

## 2014-02-28 DIAGNOSIS — J4489 Other specified chronic obstructive pulmonary disease: Secondary | ICD-10-CM

## 2014-02-28 LAB — T3, FREE: T3, Free: 3.1 pg/mL (ref 2.3–4.2)

## 2014-02-28 LAB — TSH: TSH: 0.366 u[IU]/mL (ref 0.350–4.500)

## 2014-02-28 LAB — T4, FREE: Free T4: 1.43 ng/dL (ref 0.80–1.80)

## 2014-02-28 MED ORDER — ALBUTEROL SULFATE HFA 108 (90 BASE) MCG/ACT IN AERS: INHALATION_SPRAY | RESPIRATORY_TRACT | Status: DC

## 2014-02-28 MED ORDER — ALENDRONATE SODIUM 70 MG PO TABS: 70.0000 mg | ORAL_TABLET | ORAL | Status: DC

## 2014-02-28 MED ORDER — FLUTICASONE-SALMETEROL 500-50 MCG/DOSE IN AEPB: 1.0000 | INHALATION_SPRAY | Freq: Two times a day (BID) | RESPIRATORY_TRACT | Status: DC

## 2014-02-28 NOTE — Patient Instructions (Signed)
Make sure you start a calcium/vitamin D supplement twice a day - with breakfast and dinner.  I recommend Citracal or Caltrate (but you can get the generic/off-brand version of these calcium carbonate vitamins) - find one that has 500-600mg  of calcium combined with at least 400u of vitamin D - take twice a day. Start lifting weights.  Osteoporosis Throughout your life, your body breaks down old bone and replaces it with new bone. As you get older, your body does not replace bone as quickly as it breaks it down. By the age of 30 years, most people begin to gradually lose bone because of the imbalance between bone loss and replacement. Some people lose more bone than others. Bone loss beyond a specified normal degree is considered osteoporosis.  Osteoporosis affects the strength and durability of your bones. The inside of the ends of your bones and your flat bones, like the bones of your pelvis, look like honeycomb, filled with tiny open spaces. As bone loss occurs, your bones become less dense. This means that the open spaces inside your bones become bigger and the walls between these spaces become thinner. This makes your bones weaker. Bones of a person with osteoporosis can become so weak that they can break (fracture) during minor accidents, such as a simple fall. CAUSES  The following factors have been associated with the development of osteoporosis:  Smoking.  Drinking more than 2 alcoholic drinks several days per week.  Long-term use of certain medicines:  Corticosteroids.  Chemotherapy medicines.  Thyroid medicines.  Antiepileptic medicines.  Gonadal hormone suppression medicine.  Immunosuppression medicine.  Being underweight.  Lack of physical activity.  Lack of exposure to the sun. This can lead to vitamin D deficiency.  Certain medical conditions:  Certain inflammatory bowel diseases, such as Crohn disease and ulcerative  colitis.  Diabetes.  Hyperthyroidism.  Hyperparathyroidism. RISK FACTORS Anyone can develop osteoporosis. However, the following factors can increase your risk of developing osteoporosis:  Gender--Women are at higher risk than men.  Age--Being older than 50 years increases your risk.  Ethnicity--White and Asian people have an increased risk.  Weight --Being extremely underweight can increase your risk of osteoporosis.  Family history of osteoporosis--Having a family member who has developed osteoporosis can increase your risk. SYMPTOMS  Usually, people with osteoporosis have no symptoms.  DIAGNOSIS  Signs during a physical exam that may prompt your caregiver to suspect osteoporosis include:  Decreased height. This is usually caused by the compression of the bones that form your spine (vertebrae) because they have weakened and become fractured.  A curving or rounding of the upper back (kyphosis). To confirm signs of osteoporosis, your caregiver may request a procedure that uses 2 low-dose X-ray beams with different levels of energy to measure your bone mineral density (dual-energy X-ray absorptiometry [DXA]). Also, your caregiver may check your level of vitamin D. TREATMENT  The goal of osteoporosis treatment is to strengthen bones in order to decrease the risk of bone fractures. There are different types of medicines available to help achieve this goal. Some of these medicines work by slowing the processes of bone loss. Some medicines work by increasing bone density. Treatment also involves making sure that your levels of calcium and vitamin D are adequate. PREVENTION  There are things you can do to help prevent osteoporosis. Adequate intake of calcium and vitamin D can help you achieve optimal bone mineral density. Regular exercise can also help, especially resistance and weight-bearing activities. If you smoke, quitting smoking  is an important part of osteoporosis prevention. MAKE  SURE YOU:  Understand these instructions.  Will watch your condition.  Will get help right away if you are not doing well or get worse. FOR MORE INFORMATION www.osteo.org and [employer].ca Document Released: 03/16/2005 Document Revised: 10/01/2012 Document Reviewed: 05/21/2011 [facility] Patient Information 2015 [place], [place]. This information is not intended to replace advice given to you by your health care provider. Make sure you discuss any questions you have with your health care provider.

## 2014-02-28 NOTE — Progress Notes (Addendum)
Subjective:    Patient ID: Cheryl [name], female    DOB: October 12, 1945, 68 y.o.   MRN: [id number] This chart was scribed for Cheryl Mocha, MD by Julian Hy, ED Scribe. The patient was seen in Room 23. The patient's care was started at 9:07 AM.   02/28/2014  Chief Complaint  Patient presents with  . Follow-up    medication   HPI HPI Comments: COVA KNIERIEM is a 68 y.o. female who presents to the [facility] for a follow-up visit. She is doing  Pt notes her sons are well. Her husband had multiple pulmonary embolisms, while they were on vacation.  Synthroid She states she is doing well on Synthroid, and has no complaints or side effects.  Feet Pt notes her feet are healing well and she denies any pain at this time.   Low Oxygen Saturation Pt denies any cough or congestion at this time.   Osteoporosis  Pt denies she is currently taking a calcium supplement. Pt denies she ever has indigestion. Pt denies ever taking Fosamax.   Cheryl [name] is here today to recheck on her hypothyroidism. Her synthroid dose was decreased over 3 months ago. Her thyroid replacement dose needs to be monitored very carefully, as when she was profoundly hypothyroid it sent her into a severe maniac state that required psychiatric treatment. Since her last visit, Cheryl [name] has had her DEXA scan done that showed that she had developed osteoporosis. She was not started on medication as I wanted to review her indications and treatment options with her at in-office visit. At last office visit, pt was found to have developed stress fractures. She was placed in a post-op shoe. Podiatry saw her one month later and said she was healing well.  Past Medical History  Diagnosis Date  . Allergic rhinitis   . Asthma   . Emphysema   . Hypertension   . Abnormal heart rhythm   . Partial deafness   . Hyperlipidemia   . Family history of anesthesia complication     TWIN SISTER HAS DELAYED REACTION   .  Shortness of breath   . Cardiomegaly   . Hypothyroidism     SEVERE   . Osteoporosis    Current Outpatient Prescriptions on File Prior to Visit  Medication Sig Dispense Refill  . albuterol (PROAIR HFA) 108 (90 BASE) MCG/ACT inhaler INHALE 2 PUFFS INTO THE LUNGS EVERY 6 HOURS AS NEEDED FOR WHEEZING  8.5 g  5  . amLODipine-benazepril (LOTREL) 5-20 MG per capsule Take 1 capsule by mouth every evening.  90 capsule  3  . ARIPiprazole (ABILIFY) 5 MG tablet Take 1 tablet (5 mg total) by mouth once.  30 tablet  3  . brimonidine (ALPHAGAN) 0.2 % ophthalmic solution Place 1 drop into both eyes 3 (three) times daily.  5 mL  0  . Fluticasone-Salmeterol (ADVAIR DISKUS) 500-50 MCG/DOSE AEPB Inhale 1 puff into the lungs 2 (two) times daily.  60 each  5  . nystatin (MYCOSTATIN) 100000 UNIT/ML suspension Use as directed 5 mLs (500,000 Units total) in the mouth or throat 4 (four) times daily.  180 mL  0  . SYNTHROID 75 MCG tablet TAKE 1 TABLET BY MOUTH DAILY BEFORE BREAKFAST  90 tablet  0  . Loperamide-Simethicone (IMODIUM ADVANCED) 2-125 MG CHEW Chew by mouth as needed.       . penicillin v potassium (VEETID) 500 MG tablet Take 500 mg by mouth 4 (four) times  daily. For root canal procedure       No current facility-administered medications on file prior to visit.   Allergies  Allergen Reactions  . Aspirin     REACTION: asthma flare  . Codeine     REACTION: ulcer    Review of Systems  HENT: Negative for congestion.   Respiratory: Negative for cough.   Musculoskeletal: Negative for arthralgias.  All other systems reviewed and are negative.  Objective:  Triage Vitals: BP 126/62  Pulse 83  Temp(Src) 98.3 F (36.8 C) (Oral)  Resp 16  Ht 5' 0.5" (1.537 m)  Wt 108 lb 12.8 oz (49.351 kg)  BMI 20.89 kg/m2  SpO2 91%  Physical Exam  Nursing note and vitals reviewed. Constitutional: She is oriented to person, place, and time. She appears well-developed and well-nourished. No distress.  HENT:    Head: Normocephalic and atraumatic.  Eyes: Conjunctivae and EOM are normal.  Neck: Neck supple. No tracheal deviation present.  Cardiovascular: Normal rate, regular rhythm and normal heart sounds.   Pulmonary/Chest: Effort normal and breath sounds normal. No respiratory distress.  Musculoskeletal: Normal range of motion.  Neurological: She is alert and oriented to person, place, and time.  Skin: Skin is warm and dry.  Psychiatric: She has a normal mood and affect. Her behavior is normal.   Assessment & Plan:  9:18 AM- Patient informed of current plan for treatment and evaluation and agrees with plan at this time.  Severe hypothyroidism - Plan: TSH, T4, Free, T3, Free - TSH normal - cont on same dose of daily levothyroxine  Osteoporosis - found on last dexa scan and just had fragility fracture in her foot so start on bisphosphonate - discussed r/b and details of how to take med.  Reviewed taking bid calcium supplement of 400-600mg /dose with vitamin D.  Start weight bearing exercise.  ASTHMA  Dyspnea - Plan: Fluticasone-Salmeterol (ADVAIR DISKUS) 500-50 MCG/DOSE AEPB  Chronic obstructive pulmonary disease, unspecified COPD, unspecified chronic bronchitis type - Plan: Fluticasone-Salmeterol (ADVAIR DISKUS) 500-50 MCG/DOSE AEPB  Meds ordered this encounter  Medications  . Fluticasone-Salmeterol (ADVAIR DISKUS) 500-50 MCG/DOSE AEPB    Sig: Inhale 1 puff into the lungs 2 (two) times daily.    Dispense:  60 each    Refill:  5  . albuterol (PROAIR HFA) 108 (90 BASE) MCG/ACT inhaler    Sig: INHALE 2 PUFFS INTO THE LUNGS EVERY 6 HOURS AS NEEDED FOR WHEEZING    Dispense:  8.5 g    Refill:  5  . alendronate (FOSAMAX) 70 MG tablet    Sig: Take 1 tablet (70 mg total) by mouth every 7 (seven) days. Take with a full glass of water on an empty stomach.    Dispense:  4 tablet    Refill:  11    I personally performed the services described in this documentation, which was scribed in my  presence. The recorded information has been reviewed and considered, and addended by me as needed.  [name], MD MPH  Results for orders placed in visit on 02/28/14  TSH      Result Value Ref Range   TSH 0.366  0.350 - 4.500 uIU/mL  T4, FREE      Result Value Ref Range   Free T4 1.43  0.80 - 1.80 ng/dL  T3, FREE      Result Value Ref Range   T3, Free 3.1  2.3 - 4.2 pg/mL

## 2014-03-04 MED ORDER — SYNTHROID 75 MCG PO TABS: 75.0000 ug | ORAL_TABLET | Freq: Every day | ORAL | Status: DC

## 2014-04-13 ENCOUNTER — Encounter: Payer: Self-pay | Admitting: Family Medicine

## 2014-05-30 ENCOUNTER — Ambulatory Visit: Payer: BC Managed Care – PPO | Admitting: Family Medicine

## 2014-06-05 ENCOUNTER — Telehealth: Payer: Self-pay

## 2014-06-05 NOTE — Telephone Encounter (Signed)
Left message with patient's husband for her to get her flu shot.  He stated that he will bring her in either this week or next week.

## 2014-07-30 ENCOUNTER — Other Ambulatory Visit: Payer: Self-pay | Admitting: Family Medicine

## 2014-09-29 ENCOUNTER — Ambulatory Visit (INDEPENDENT_AMBULATORY_CARE_PROVIDER_SITE_OTHER): Payer: BLUE CROSS/BLUE SHIELD | Admitting: Family Medicine

## 2014-09-29 VITALS — BP 150/78 | HR 59 | Temp 97.6°F | Resp 16 | Ht 59.75 in | Wt 116.1 lb

## 2014-09-29 DIAGNOSIS — J449 Chronic obstructive pulmonary disease, unspecified: Secondary | ICD-10-CM | POA: Diagnosis not present

## 2014-09-29 DIAGNOSIS — I10 Essential (primary) hypertension: Secondary | ICD-10-CM

## 2014-09-29 DIAGNOSIS — R7309 Other abnormal glucose: Secondary | ICD-10-CM

## 2014-09-29 DIAGNOSIS — R0600 Dyspnea, unspecified: Secondary | ICD-10-CM

## 2014-09-29 DIAGNOSIS — E039 Hypothyroidism, unspecified: Secondary | ICD-10-CM

## 2014-09-29 DIAGNOSIS — M810 Age-related osteoporosis without current pathological fracture: Secondary | ICD-10-CM

## 2014-09-29 DIAGNOSIS — J4540 Moderate persistent asthma, uncomplicated: Secondary | ICD-10-CM

## 2014-09-29 DIAGNOSIS — R7303 Prediabetes: Secondary | ICD-10-CM

## 2014-09-29 DIAGNOSIS — Z79899 Other long term (current) drug therapy: Secondary | ICD-10-CM

## 2014-09-29 DIAGNOSIS — R351 Nocturia: Secondary | ICD-10-CM | POA: Diagnosis not present

## 2014-09-29 DIAGNOSIS — E038 Other specified hypothyroidism: Secondary | ICD-10-CM | POA: Diagnosis not present

## 2014-09-29 LAB — POCT CBC
GRANULOCYTE PERCENT: 66.1 % (ref 37–80)
HEMATOCRIT: 42.2 % (ref 37.7–47.9)
Hemoglobin: 13.7 g/dL (ref 12.2–16.2)
Lymph, poc: 2.5 (ref 0.6–3.4)
MCH, POC: 28.9 pg (ref 27–31.2)
MCHC: 32.5 g/dL (ref 31.8–35.4)
MCV: 88.7 fL (ref 80–97)
MID (CBC): 0.4 (ref 0–0.9)
MPV: 7.5 fL (ref 0–99.8)
PLATELET COUNT, POC: 325 10*3/uL (ref 142–424)
POC Granulocyte: 5.8 (ref 2–6.9)
POC LYMPH PERCENT: 29.2 %L (ref 10–50)
POC MID %: 4.7 %M (ref 0–12)
RBC: 4.75 M/uL (ref 4.04–5.48)
RDW, POC: 14.6 %
WBC: 8.7 10*3/uL (ref 4.6–10.2)

## 2014-09-29 LAB — POCT URINALYSIS DIPSTICK
Bilirubin, UA: NEGATIVE
Blood, UA: NEGATIVE
GLUCOSE UA: NEGATIVE
KETONES UA: NEGATIVE
Nitrite, UA: NEGATIVE
Protein, UA: NEGATIVE
Urobilinogen, UA: 0.2
pH, UA: 5

## 2014-09-29 LAB — POCT UA - MICROSCOPIC ONLY
Casts, Ur, LPF, POC: NEGATIVE
Crystals, Ur, HPF, POC: NEGATIVE
YEAST UA: NEGATIVE

## 2014-09-29 LAB — GLUCOSE, POCT (MANUAL RESULT ENTRY): POC Glucose: 104 mg/dl — AB (ref 70–99)

## 2014-09-29 MED ORDER — AMLODIPINE BESY-BENAZEPRIL HCL 5-20 MG PO CAPS: 1.0000 | ORAL_CAPSULE | Freq: Every evening | ORAL | Status: DC

## 2014-09-29 MED ORDER — ALBUTEROL SULFATE HFA 108 (90 BASE) MCG/ACT IN AERS: INHALATION_SPRAY | RESPIRATORY_TRACT | Status: DC

## 2014-09-29 MED ORDER — FLUTICASONE-SALMETEROL 500-50 MCG/DOSE IN AEPB: 1.0000 | INHALATION_SPRAY | Freq: Two times a day (BID) | RESPIRATORY_TRACT | Status: DC

## 2014-09-29 NOTE — Progress Notes (Addendum)
Subjective:    Patient ID: Cheryl [name], female    DOB: 16-Apr-1946, 69 y.o.   MRN: [id number]  HPI Chief Complaint  Patient presents with  . Thyroid Check   This chart was scribed for [name], MD by Andrew Au, ED Scribe. This patient was seen in room 2 and the patient's care was started at 6:32 PM.  HPI Comments: Cheryl [name] is a 69 y.o. female who presents to the [facility] for a thyroid check.  Pt states she has been feeling tired. Pt was last seen by me 02/28/14. Her BP in office toay is 150/78.  She denies checking BP outside of the office and takes BP medication at night. Pt states she stopped taking abilify 3 months ago and is no longer takes Fosamax. Pt states her asthma has been okay. Pt used Advair and albuterol but admits to forgetting to take Advair in the morning. Pt has been seen at [facility] pulmonologist and allergist in the past. Pt often wakes up during the night to use the bathroom. She notes she woke up 3 times last night to use the bathroom. Her last DEXA scan was about 1 year ago. Pt denies leg swelling.   Past Medical History  Diagnosis Date  . Allergic rhinitis   . Asthma   . Emphysema   . Hypertension   . Abnormal heart rhythm   . Partial deafness   . Hyperlipidemia   . Family history of anesthesia complication     TWIN SISTER HAS DELAYED REACTION   . Shortness of breath   . Cardiomegaly   . Hypothyroidism     SEVERE   . Osteoporosis    Past Surgical History  Procedure Laterality Date  . Cholecystectomy    . Back surgery    . Nasal sinus surgery    . Knee surgery    . Tubal ligation    . Ear operations    . 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 Abdominal hysterectomy    . Thyroidectomy      Uncertain date   Prior to Admission medications   Medication Sig Start Date End Date Taking? Authorizing Provider  albuterol (PROAIR HFA) 108 (90 BASE) MCG/ACT inhaler INHALE 2 PUFFS INTO THE LUNGS EVERY 6 HOURS AS NEEDED FOR  WHEEZING 02/28/14  Yes [name], MD  amLODipine-benazepril (LOTREL) 5-20 MG per capsule Take 1 capsule by mouth every evening. PATIENT NEEDS OFFICE VISIT FOR ADDITIONAL REFILLS 08/01/14  Yes Maurice March, MD  brimonidine [facility]) 0.2 % ophthalmic solution Place 1 drop into both eyes 3 (three) times daily. 01/29/13  Yes [name], MD  Fluticasone-Salmeterol (ADVAIR DISKUS) 500-50 MCG/DOSE AEPB Inhale 1 puff into the lungs 2 (two) times daily. 02/28/14  Yes [name], MD  SYNTHROID 75 MCG tablet Take 1 tablet (75 mcg total) by mouth daily before breakfast. 03/04/14  Yes [name], MD  alendronate (FOSAMAX) 70 MG tablet Take 1 tablet (70 mg total) by mouth every 7 (seven) days. Take with a full glass of water on an empty stomach. Patient not taking: Reported on 09/29/2014 02/28/14   [name], MD  ARIPiprazole (ABILIFY) 5 MG tablet Take 1 tablet (5 mg total) by mouth once. Patient not taking: Reported on 09/29/2014 09/19/13   Larena Sox, MD    Review of Systems  Constitutional: Positive for fatigue.  Cardiovascular: Negative for leg swelling.  Genitourinary: Positive for frequency.    Objective:   Physical Exam  Constitutional: She is oriented to person, place, and time. She appears well-developed and well-nourished. No distress.  HENT:  Head: Normocephalic and atraumatic.  Eyes: Conjunctivae and EOM are normal.  Neck: Neck supple.  Cardiovascular: Normal rate.  Exam reveals no gallop and no friction rub.   Murmur heard.  Systolic murmur is present with a grade of 2/6  BP- 140/70  Pulmonary/Chest: Effort normal and breath sounds normal. She has no wheezes.  Good air movement.  Musculoskeletal: Normal range of motion.  Neurological: She is alert and oriented to person, place, and time.  Skin: Skin is warm and dry.  Psychiatric: She has a normal mood and affect. Her behavior is normal.  Nursing note and vitals reviewed.  Filed Vitals:   09/29/14 1816  BP: 150/78  Pulse:  59  Temp: 97.6 F (36.4 C)  TempSrc: Oral  Resp: 16  Height: 4' 11.75" (1.518 m)  Weight: 116 lb 2 oz (52.674 kg)  SpO2: 95%   Results for orders placed or performed in visit on 09/29/14  POCT CBC  Result Value Ref Range   WBC 8.7 4.6 - 10.2 K/uL   Lymph, poc 2.5 0.6 - 3.4   POC LYMPH PERCENT 29.2 10 - 50 %L   MID (cbc) 0.4 0 - 0.9   POC MID % 4.7 0 - 12 %M   POC Granulocyte 5.8 2 - 6.9   Granulocyte percent 66.1 37 - 80 %G   RBC 4.75 4.04 - 5.48 M/uL   Hemoglobin 13.7 12.2 - 16.2 g/dL   HCT, POC 29.542.2 62.137.7 - 47.9 %   MCV 88.7 80 - 97 fL   MCH, POC 28.9 27 - 31.2 pg   MCHC 32.5 31.8 - 35.4 g/dL   RDW, POC 30.814.6 %   Platelet Count, POC 325 142 - 424 K/uL   MPV 7.5 0 - 99.8 fL  POCT glucose (manual entry)  Result Value Ref Range   POC Glucose 104 (A) 70 - 99 mg/dl  POCT UA - Microscopic Only  Result Value Ref Range   WBC, Ur, HPF, POC 4-8    RBC, urine, microscopic 1-4    Bacteria, U Microscopic trace    Mucus, UA trace    Epithelial cells, urine per micros 2-4    Crystals, Ur, HPF, POC neg    Casts, Ur, LPF, POC neg    Yeast, UA neg   POCT urinalysis dipstick  Result Value Ref Range   Color, UA yellow    Clarity, UA clear    Glucose, UA neg    Bilirubin, UA neg    Ketones, UA neg    Spec Grav, UA >=1.030    Blood, UA neg    pH, UA 5.0    Protein, UA neg    Urobilinogen, UA 0.2    Nitrite, UA neg    Leukocytes, UA small (1+)     Assessment & Plan:     1. Hypothyroidism, unspecified hypothyroidism type - tsh stable so cont brand name 75 mcg Synthroid - recheck in 4-6 mos due to h/o non-compliance and freq dose change  2. Polypharmacy   3. Pre-diabetes   4. Essential hypertension - check BP weekly - f/u in 3 mos as may need lotrel increase - bp boderline.  5. Asthma, moderate persistent, uncomplicated   6. Severe hypothyroidism   7. Osteoporosis - pt cannot tolerate bisphosphonates due to severe GERD to  pt requests endocrine referral to see if she is a  candidate for Forteo or other type of trx  8. Dyspnea   9. Chronic obstructive pulmonary disease, unspecified COPD, unspecified chronic bronchitis type   10. Nocturia     Orders Placed This Encounter  Procedures  . Urine culture  . Thyroid Panel With TSH  . Comprehensive metabolic panel  . Hemoglobin A1c  . POCT CBC  . POCT glucose (manual entry)  . POCT UA - Microscopic Only  . POCT urinalysis dipstick    Meds ordered this encounter  Medications  . Fluticasone-Salmeterol (ADVAIR DISKUS) 500-50 MCG/DOSE AEPB    Sig: Inhale 1 puff into the lungs 2 (two) times daily.    Dispense:  60 each    Refill:  5  . albuterol (PROAIR HFA) 108 (90 BASE) MCG/ACT inhaler    Sig: INHALE 2 PUFFS INTO THE LUNGS EVERY 6 HOURS AS NEEDED FOR WHEEZING    Dispense:  8.5 g    Refill:  5  . amLODipine-benazepril (LOTREL) 5-20 MG per capsule    Sig: Take 1 capsule by mouth every evening.    Dispense:  90 capsule    Refill:  1  . SYNTHROID 75 MCG tablet    Sig: Take 1 tablet (75 mcg total) by mouth daily before breakfast.    Dispense:  90 tablet    Refill:  1    Brand name medically necessary    I personally performed the services described in this documentation, which was scribed in my presence. The recorded information has been reviewed and considered, and addended by me as needed.  [name], MD MPH  Lab Results  Component Value Date   TSH 1.771 09/29/2014   T4TOTAL 10.3 09/29/2014

## 2014-09-29 NOTE — Patient Instructions (Signed)
Check your blood pressure once a week.  We may need to increase your dose at your next visit. We will refer you to endocrinology to discuss other treatments for your osteoprosis. Schedule an OV to recheck your blood pressure and thyroid in 3 months  Managing Your High Blood Pressure Blood pressure is a measurement of how forceful your blood is pressing against the walls of the arteries. Arteries are muscular tubes within the circulatory system. Blood pressure does not stay the same. Blood pressure rises when you are active, excited, or nervous; and it lowers during sleep and relaxation. If the numbers measuring your blood pressure stay above normal most of the time, you are at risk for health problems. High blood pressure (hypertension) is a long-term (chronic) condition in which blood pressure is elevated. A blood pressure reading is recorded as two numbers, such as 120 over 80 (or 120/80). The first, higher number is called the systolic pressure. It is a measure of the pressure in your arteries as the heart beats. The second, lower number is called the diastolic pressure. It is a measure of the pressure in your arteries as the heart relaxes between beats.  Keeping your blood pressure in a normal range is important to your overall health and prevention of health problems, such as heart disease and stroke. When your blood pressure is uncontrolled, your heart has to work harder than normal. High blood pressure is a very common condition in adults because blood pressure tends to rise with age. Men and women are equally likely to have hypertension but at different times in life. Before age 29, men are more likely to have hypertension. After 70 years of age, women are more likely to have it. Hypertension is especially common in African Americans. This condition often has no signs or symptoms. The cause of the condition is usually not known. Your caregiver can help you come up with a plan to keep your blood  pressure in a normal, healthy range. BLOOD PRESSURE STAGES Blood pressure is classified into four stages: normal, prehypertension, stage 1, and stage 2. Your blood pressure reading will be used to determine what type of treatment, if any, is necessary. Appropriate treatment options are tied to these four stages:  Normal  Systolic pressure (mm Hg): below 120.  Diastolic pressure (mm Hg): below 80. Prehypertension  Systolic pressure (mm Hg): 120 to 139.  Diastolic pressure (mm Hg): 80 to 89. Stage1  Systolic pressure (mm Hg): 140 to 159.  Diastolic pressure (mm Hg): 90 to 99. Stage2  Systolic pressure (mm Hg): 160 or above.  Diastolic pressure (mm Hg): 100 or above. RISKS RELATED TO HIGH BLOOD PRESSURE Managing your blood pressure is an important responsibility. Uncontrolled high blood pressure can lead to:  A heart attack.  A stroke.  A weakened blood vessel (aneurysm).  Heart failure.  Kidney damage.  Eye damage.  Metabolic syndrome.  Memory and concentration problems. HOW TO MANAGE YOUR BLOOD PRESSURE Blood pressure can be managed effectively with lifestyle changes and medicines (if needed). Your caregiver will help you come up with a plan to bring your blood pressure within a normal range. Your plan should include the following: Education  Read all information provided by your caregivers about how to control blood pressure.  Educate yourself on the latest guidelines and treatment recommendations. New research is always being done to further define the risks and treatments for high blood pressure. Lifestylechanges  Control your weight.  Avoid smoking.  Stay physically active.  Reduce the amount of salt in your diet.  Reduce stress.  Control any chronic conditions, such as high cholesterol or diabetes.  Reduce your alcohol intake. Medicines  Several medicines (antihypertensive medicines) are available, if needed, to bring blood pressure within a  normal range. Communication  Review all the medicines you take with your caregiver because there may be side effects or interactions.  Talk with your caregiver about your diet, exercise habits, and other lifestyle factors that may be contributing to high blood pressure.  See your caregiver regularly. Your caregiver can help you create and adjust your plan for managing high blood pressure. RECOMMENDATIONS FOR TREATMENT AND FOLLOW-UP  The following recommendations are based on current guidelines for managing high blood pressure in nonpregnant adults. Use these recommendations to identify the proper follow-up period or treatment option based on your blood pressure reading. You can discuss these options with your caregiver.  Systolic pressure of 120 to 139 or diastolic pressure of 80 to 89: Follow up with your caregiver as directed.  Systolic pressure of 140 to 160 or diastolic pressure of 90 to 100: Follow up with your caregiver within 2 months.  Systolic pressure above 160 or diastolic pressure above 100: Follow up with your caregiver within 1 month.  Systolic pressure above 180 or diastolic pressure above 110: Consider antihypertensive therapy; follow up with your caregiver within 1 week.  Systolic pressure above 200 or diastolic pressure above 120: Begin antihypertensive therapy; follow up with your caregiver within 1 week. Document Released: 02/29/2012 Document Reviewed: 02/29/2012 [facility] Patient Information 2015 [place], [place]. This information is not intended to replace advice given to you by your health care provider. Make sure you discuss any questions you have with your health care provider.   Asthma Attack Prevention Although there is no way to prevent asthma from starting, you can take steps to control the disease and reduce its symptoms. Learn about your asthma and how to control it. Take an active role to control your asthma by working with your health care provider to create  and follow an asthma action plan. An asthma action plan guides you in:  Taking your medicines properly.  Avoiding things that set off your asthma or make your asthma worse (asthma triggers).  Tracking your level of asthma control.  Responding to worsening asthma.  Seeking emergency care when needed. To track your asthma, keep records of your symptoms, check your peak flow number using a handheld device that shows how well air moves out of your lungs (peak flow meter), and get regular asthma checkups.  WHAT ARE SOME WAYS TO PREVENT AN ASTHMA ATTACK?  Take medicines as directed by your health care provider.  Keep track of your asthma symptoms and level of control.  With your health care provider, write a detailed plan for taking medicines and managing an asthma attack. Then be sure to follow your action plan. Asthma is an ongoing condition that needs regular monitoring and treatment.  Identify and avoid asthma triggers. Many outdoor allergens and irritants (such as pollen, mold, cold air, and air pollution) can trigger asthma attacks. Find out what your asthma triggers are and take steps to avoid them.  Monitor your breathing. Learn to recognize warning signs of an attack, such as coughing, wheezing, or shortness of breath. Your lung function may decrease before you notice any signs or symptoms, so regularly measure and record your peak airflow with a home peak flow meter.  Identify and treat attacks early. If you act  quickly, you are less likely to have a severe attack. You will also need less medicine to control your symptoms. When your peak flow measurements decrease and alert you to an upcoming attack, take your medicine as instructed and immediately stop any activity that may have triggered the attack. If your symptoms do not improve, get medical help.  Pay attention to increasing quick-relief inhaler use. If you find yourself relying on your quick-relief inhaler, your asthma is not  under control. See your health care provider about adjusting your treatment. WHAT CAN MAKE MY SYMPTOMS WORSE? A number of common things can set off or make your asthma symptoms worse and cause temporary increased inflammation of your airways. Keep track of your asthma symptoms for several weeks, detailing all the environmental and emotional factors that are linked with your asthma. When you have an asthma attack, go back to your asthma diary to see which factor, or combination of factors, might have contributed to it. Once you know what these factors are, you can take steps to control many of them. If you have allergies and asthma, it is important to take asthma prevention steps at home. Minimizing contact with the substance to which you are allergic will help prevent an asthma attack. Some triggers and ways to avoid these triggers are: Animal Dander:  Some people are allergic to the flakes of skin or dried saliva from animals with fur or feathers.   There is no such thing as a hypoallergenic dog or cat breed. All dogs or cats can cause allergies, even if they don't shed.  Keep these pets out of your home.  If you are not able to keep a pet outdoors, keep the pet out of your bedroom and other sleeping areas at all times, and keep the door closed.  Remove carpets and furniture covered with cloth from your home. If that is not possible, keep the pet away from fabric-covered furniture and carpets. Dust Mites: Many people with asthma are allergic to dust mites. Dust mites are tiny bugs that are found in every home in mattresses, pillows, carpets, fabric-covered furniture, bedcovers, clothes, stuffed toys, and other fabric-covered items.   Cover your mattress in a special dust-proof cover.  Cover your pillow in a special dust-proof cover, or wash the pillow each week in hot water. Water must be hotter than 130 F (54.4 C) to kill dust mites. Cold or warm water used with detergent and bleach can also be  effective.  Wash the sheets and blankets on your bed each week in hot water.  Try not to sleep or lie on cloth-covered cushions.  Call ahead when traveling and ask for a smoke-free hotel room. Bring your own bedding and pillows in case the hotel only supplies feather pillows and down comforters, which may contain dust mites and cause asthma symptoms.  Remove carpets from your bedroom and those laid on concrete, if you can.  Keep stuffed toys out of the bed, or wash the toys weekly in hot water or cooler water with detergent and bleach. Cockroaches: Many people with asthma are allergic to the droppings and remains of cockroaches.   Keep food and garbage in closed containers. Never leave food out.  Use poison baits, traps, powders, gels, or paste (for example, boric acid).  If a spray is used to kill cockroaches, stay out of the room until the odor goes away. Indoor Mold:  Fix leaky faucets, pipes, or other sources of water that have mold around them.  Clean floors and moldy surfaces with a fungicide or diluted bleach.  Avoid using humidifiers, vaporizers, or swamp coolers. These can spread molds through the air. Pollen and Outdoor Mold:  When pollen or mold spore counts are high, try to keep your windows closed.  Stay indoors with windows closed from late morning to afternoon. Pollen and some mold spore counts are highest at that time.  Ask your health care provider whether you need to take anti-inflammatory medicine or increase your dose of the medicine before your allergy season starts. Other Irritants to Avoid:  Tobacco smoke is an irritant. If you smoke, ask your health care provider how you can quit. Ask family members to quit smoking, too. Do not allow smoking in your home or car.  If possible, do not use a wood-burning stove, kerosene heater, or fireplace. Minimize exposure to all sources of smoke, including incense, candles, fires, and fireworks.  Try to stay away from  strong odors and sprays, such as perfume, talcum powder, hair spray, and paints.  Decrease humidity in your home and use an indoor air cleaning device. Reduce indoor humidity to below 60%. Dehumidifiers or central air conditioners can do this.  Decrease house dust exposure by changing furnace and air cooler filters frequently.  Try to have someone else vacuum for you once or twice a week. Stay out of rooms while they are being vacuumed and for a short while afterward.  If you vacuum, use a dust mask from a hardware store, a double-layered or microfilter vacuum cleaner bag, or a vacuum cleaner with a HEPA filter.  Sulfites in foods and beverages can be irritants. Do not drink beer or wine or eat dried fruit, processed potatoes, or shrimp if they cause asthma symptoms.  Cold air can trigger an asthma attack. Cover your nose and mouth with a scarf on cold or windy days.  Several health conditions can make asthma more difficult to manage, including a runny nose, sinus infections, reflux disease, psychological stress, and sleep apnea. Work with your health care provider to manage these conditions.  Avoid close contact with people who have a respiratory infection such as a cold or the flu, since your asthma symptoms may get worse if you catch the infection. Wash your hands thoroughly after touching items that may have been handled by people with a respiratory infection.  Get a flu shot every year to protect against the flu virus, which often makes asthma worse for days or weeks. Also get a pneumonia shot if you have not previously had one. Unlike the flu shot, the pneumonia shot does not need to be given yearly. Medicines:  Talk to your health care provider about whether it is safe for you to take aspirin or non-steroidal anti-inflammatory medicines (NSAIDs). In a small number of people with asthma, aspirin and NSAIDs can cause asthma attacks. These medicines must be avoided by people who have known  aspirin-sensitive asthma. It is important that people with aspirin-sensitive asthma read labels of all over-the-counter medicines used to treat pain, colds, coughs, and fever.  Beta-blockers and ACE inhibitors are other medicines you should discuss with your health care provider. HOW CAN I FIND OUT WHAT I AM ALLERGIC TO? Ask your asthma health care provider about allergy skin testing or blood testing (the RAST test) to identify the allergens to which you are sensitive. If you are found to have allergies, the most important thing to do is to try to avoid exposure to any allergens that you are  sensitive to as much as possible. Other treatments for allergies, such as medicines and allergy shots (immunotherapy) are available.  CAN I EXERCISE? Follow your health care provider's advice regarding asthma treatment before exercising. It is important to maintain a regular exercise program, but vigorous exercise or exercise in cold, humid, or dry environments can cause asthma attacks, especially for those people who have exercise-induced asthma. Document Released: 05/25/2009 Document Revised: 06/11/2013 Document Reviewed: 12/12/2012 [facility] Patient Information 2015 [place], [place]. This information is not intended to replace advice given to you by your health care provider. Make sure you discuss any questions you have with your health care provider.

## 2014-09-30 LAB — COMPREHENSIVE METABOLIC PANEL
ALK PHOS: 109 U/L (ref 39–117)
ALT: 18 U/L (ref 0–35)
AST: 26 U/L (ref 0–37)
Albumin: 3.8 g/dL (ref 3.5–5.2)
BILIRUBIN TOTAL: 0.3 mg/dL (ref 0.2–1.2)
BUN: 11 mg/dL (ref 6–23)
CO2: 24 mEq/L (ref 19–32)
Calcium: 9 mg/dL (ref 8.4–10.5)
Chloride: 109 mEq/L (ref 96–112)
Creat: 0.63 mg/dL (ref 0.50–1.10)
GLUCOSE: 109 mg/dL — AB (ref 70–99)
POTASSIUM: 3.9 meq/L (ref 3.5–5.3)
Sodium: 142 mEq/L (ref 135–145)
Total Protein: 7.1 g/dL (ref 6.0–8.3)

## 2014-09-30 LAB — THYROID PANEL WITH TSH
FREE THYROXINE INDEX: 3.2 (ref 1.4–3.8)
T3 Uptake: 31 % (ref 22–35)
T4 TOTAL: 10.3 ug/dL (ref 4.5–12.0)
TSH: 1.771 u[IU]/mL (ref 0.350–4.500)

## 2014-09-30 LAB — HEMOGLOBIN A1C
Hgb A1c MFr Bld: 6.3 % — ABNORMAL HIGH (ref <5.7)
Mean Plasma Glucose: 134 mg/dL — ABNORMAL HIGH (ref <117)

## 2014-10-01 ENCOUNTER — Encounter: Payer: Self-pay | Admitting: Family Medicine

## 2014-10-01 MED ORDER — SYNTHROID 75 MCG PO TABS: 75.0000 ug | ORAL_TABLET | Freq: Every day | ORAL | Status: DC

## 2014-10-02 LAB — URINE CULTURE: Colony Count: 50000

## 2014-10-24 ENCOUNTER — Ambulatory Visit: Payer: Self-pay | Admitting: Endocrinology

## 2014-11-03 ENCOUNTER — Ambulatory Visit: Payer: Self-pay | Admitting: Endocrinology

## 2014-11-03 DIAGNOSIS — Z0289 Encounter for other administrative examinations: Secondary | ICD-10-CM

## 2014-11-21 ENCOUNTER — Ambulatory Visit (INDEPENDENT_AMBULATORY_CARE_PROVIDER_SITE_OTHER): Payer: Self-pay | Admitting: Endocrinology

## 2014-11-21 ENCOUNTER — Encounter: Payer: Self-pay | Admitting: Endocrinology

## 2014-11-21 VITALS — BP 132/70 | HR 50 | Temp 98.1°F | Ht 59.5 in | Wt 113.0 lb

## 2014-11-21 DIAGNOSIS — M810 Age-related osteoporosis without current pathological fracture: Secondary | ICD-10-CM

## 2014-11-21 MED ORDER — IBANDRONATE SODIUM 150 MG PO TABS: 150.0000 mg | ORAL_TABLET | ORAL | Status: DC

## 2014-11-21 NOTE — Progress Notes (Signed)
Subjective:    Patient ID: Cheryl [name], female    DOB: 09-13-1945, 69 y.o.   MRN: [id number]  HPI Pt was noted to have moderate osteoporosis in 2009.  She has no history of any of the following: early menopause, multiple myeloma, renal dz, prolonged bedrest, alcoholism, smoking, liver dz, vid-d deficiency, primary hyperparathyroidism, heparin, or anticonvulsants.  She has taken 3 short courses of oral steroids over the years.  Only bony fx was bone in right foot, in the 1980's (did not develop an associated deformity).  She took boniva 2013-2014.  She was rx'ed fosamax in 2016, but pt says she did not take it.   Past Medical History  Diagnosis Date  . Allergic rhinitis   . Asthma   . Emphysema   . Hypertension   . Abnormal heart rhythm   . Partial deafness   . Hyperlipidemia   . Family history of anesthesia complication     TWIN SISTER HAS DELAYED REACTION   . Shortness of breath   . Cardiomegaly   . Hypothyroidism     SEVERE   . Osteoporosis     Past Surgical History  Procedure Laterality Date  . Cholecystectomy    . Back surgery    . Nasal sinus surgery    . Knee surgery    . Tubal ligation    . Ear operations    . 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 Abdominal hysterectomy    . Thyroidectomy      Uncertain date    History   Social History  . Marital Status: Married    Spouse Name: N/A  . Number of Children: N/A  . Years of Education: N/A   Occupational History  . Not on file.   Social History Main Topics  . Smoking status: Never Smoker   . Smokeless tobacco: Never Used     Comment: EXPOSED TO SECOND HAND SMOKE AS CHILD  . Alcohol Use: No  . Drug Use: No  . Sexual Activity: Not on file   Other Topics Concern  . Not on file   Social History Narrative    Current Outpatient Prescriptions on File Prior to Visit  Medication Sig Dispense Refill  . albuterol (PROAIR HFA) 108 (90 BASE) MCG/ACT inhaler INHALE 2 PUFFS INTO THE LUNGS EVERY 6 HOURS AS  NEEDED FOR WHEEZING 8.5 g 5  . amLODipine-benazepril (LOTREL) 5-20 MG per capsule Take 1 capsule by mouth every evening. 90 capsule 1  . brimonidine (ALPHAGAN) 0.2 % ophthalmic solution Place 1 drop into both eyes 3 (three) times daily. 5 mL 0  . Fluticasone-Salmeterol (ADVAIR DISKUS) 500-50 MCG/DOSE AEPB Inhale 1 puff into the lungs 2 (two) times daily. 60 each 5  . SYNTHROID 75 MCG tablet Take 1 tablet (75 mcg total) by mouth daily before breakfast. 90 tablet 1   No current facility-administered medications on file prior to visit.    Allergies  Allergen Reactions  . Aspirin     REACTION: asthma flare  . Codeine     REACTION: ulcer    Family History  Problem Relation Age of Onset  . Allergies Sister     X2  . Heart disease Mother   . Clotting disorder Mother   . Cancer Mother   . Osteoporosis Maternal Grandmother     BP 132/70 mmHg  Pulse 50  Temp(Src) 98.1 F (36.7 C) (Oral)  Ht 4' 11.5" (1.511 m)  Wt 113 lb (  51.256 kg)  BMI 22.45 kg/m2  SpO2 92%  Review of Systems denies weight loss, galactorrhea, hematuria, memory loss, numbness, arthralgias, abdominal pain,  skin rash, visual loss, sob, heartburn, rhinorrhea, and easy bruising.  She denies cold intolerance.      Objective:   Physical Exam VS: see vs page GEN: no distress HEAD: head: no deformity eyes: no periorbital swelling, no proptosis external nose and ears are normal mouth: no lesion seen NECK: supple, thyroid is not enlarged CHEST WALL: no deformity LUNGS:  Clear to auscultation CV: reg rate and rhythm, no murmur ABD: abdomen is soft, nontender.  no hepatosplenomegaly.  not distended.  no hernia MUSCULOSKELETAL: muscle bulk and strength are grossly normal.  no obvious joint swelling.  gait is normal and steady EXTEMITIES: no deformity.  no ulcer on the feet.  feet are of normal color and temp.  no edema PULSES: dorsalis pedis intact bilat.  no carotid bruit NEURO:  cn 2-12 grossly intact.   readily  moves all 4's.  sensation is intact to touch on the feet SKIN:  Normal texture and temperature.  No rash or suspicious lesion is visible.   NODES:  None palpable at the neck PSYCH: alert, well-oriented.  Does not appear anxious nor depressed.  Radiol: i reviewed DEXA result (12/16/13)  Lab Results  Component Value Date   CALCIUM 9.0 09/29/2014      Assessment & Plan:  Osteoporosis, new to me.  We discussed rx options: for now, she chooses to try boniva again before taking reclast.  Patient is advised the following: Patient Instructions  blood tests are requested for you today.  We'll let you know about the results. Please resume the boniva.  i have sent a prescription to your pharmacy. If this causes you to have heartburn, please call, so we can change to a intravenous med instead.   Please return in 1 year. it is critically important to prevent falling down (keep floor areas well-lit, dry, and free of loose objects.  If you have a cane, walker, or wheelchair, you should use it, even for short trips around the house.  Also, try not to rush).

## 2014-11-21 NOTE — Patient Instructions (Addendum)
blood tests are requested for you today.  We'll let you know about the results. Please resume the boniva.  i have sent a prescription to your pharmacy. If this causes you to have heartburn, please call, so we can change to a intravenous med instead.   Please return in 1 year. it is critically important to prevent falling down (keep floor areas well-lit, dry, and free of loose objects.  If you have a cane, walker, or wheelchair, you should use it, even for short trips around the house.  Also, try not to rush).

## 2014-11-24 LAB — PTH, INTACT AND CALCIUM
Calcium: 9.8 mg/dL (ref 8.4–10.5)
PTH: 55 pg/mL (ref 14–64)

## 2014-11-24 LAB — VITAMIN D 25 HYDROXY (VIT D DEFICIENCY, FRACTURES): VITD: 32.1 ng/mL (ref 30.00–100.00)

## 2014-11-26 LAB — PROTEIN ELECTROPHORESIS, SERUM
Albumin ELP: 4.1 g/dL (ref 3.8–4.8)
Alpha-1-Globulin: 0.2 g/dL (ref 0.2–0.3)
Alpha-2-Globulin: 0.7 g/dL (ref 0.5–0.9)
Beta 2: 0.4 g/dL (ref 0.2–0.5)
Beta Globulin: 0.6 g/dL (ref 0.4–0.6)
GAMMA GLOBULIN: 1.4 g/dL (ref 0.8–1.7)
TOTAL PROTEIN, SERUM ELECTROPHOR: 7.4 g/dL (ref 6.1–8.1)

## 2015-01-02 ENCOUNTER — Ambulatory Visit: Payer: BLUE CROSS/BLUE SHIELD | Admitting: Family Medicine

## 2015-02-10 ENCOUNTER — Telehealth: Payer: Self-pay

## 2015-02-10 DIAGNOSIS — E039 Hypothyroidism, unspecified: Secondary | ICD-10-CM

## 2015-02-10 NOTE — Telephone Encounter (Signed)
Pt is needing a refill on synthroid

## 2015-02-11 MED ORDER — SYNTHROID 75 MCG PO TABS: 75.0000 ug | ORAL_TABLET | Freq: Every day | ORAL | Status: DC

## 2015-02-11 NOTE — Telephone Encounter (Signed)
Prescription sent

## 2015-03-02 ENCOUNTER — Other Ambulatory Visit: Payer: Self-pay | Admitting: Family Medicine

## 2015-03-04 ENCOUNTER — Other Ambulatory Visit: Payer: Self-pay | Admitting: Family Medicine

## 2015-04-02 ENCOUNTER — Other Ambulatory Visit: Payer: Self-pay | Admitting: Family Medicine

## 2015-04-02 ENCOUNTER — Encounter: Payer: Self-pay | Admitting: Physician Assistant

## 2015-04-02 ENCOUNTER — Ambulatory Visit (INDEPENDENT_AMBULATORY_CARE_PROVIDER_SITE_OTHER): Payer: BLUE CROSS/BLUE SHIELD | Admitting: Physician Assistant

## 2015-04-02 ENCOUNTER — Ambulatory Visit (INDEPENDENT_AMBULATORY_CARE_PROVIDER_SITE_OTHER): Payer: BLUE CROSS/BLUE SHIELD

## 2015-04-02 VITALS — BP 130/72 | HR 90 | Temp 98.0°F | Resp 16 | Ht 60.0 in | Wt 113.2 lb

## 2015-04-02 DIAGNOSIS — R7303 Prediabetes: Secondary | ICD-10-CM

## 2015-04-02 DIAGNOSIS — M25551 Pain in right hip: Secondary | ICD-10-CM

## 2015-04-02 DIAGNOSIS — E038 Other specified hypothyroidism: Secondary | ICD-10-CM

## 2015-04-02 DIAGNOSIS — J4530 Mild persistent asthma, uncomplicated: Secondary | ICD-10-CM

## 2015-04-02 DIAGNOSIS — B370 Candidal stomatitis: Secondary | ICD-10-CM

## 2015-04-02 DIAGNOSIS — I10 Essential (primary) hypertension: Secondary | ICD-10-CM | POA: Diagnosis not present

## 2015-04-02 DIAGNOSIS — R0600 Dyspnea, unspecified: Secondary | ICD-10-CM | POA: Diagnosis not present

## 2015-04-02 DIAGNOSIS — E785 Hyperlipidemia, unspecified: Secondary | ICD-10-CM | POA: Diagnosis not present

## 2015-04-02 DIAGNOSIS — E039 Hypothyroidism, unspecified: Secondary | ICD-10-CM

## 2015-04-02 LAB — LIPID PANEL
Cholesterol: 227 mg/dL — ABNORMAL HIGH (ref 125–200)
HDL: 96 mg/dL (ref >=46)
LDL Cholesterol: 120 mg/dL (ref <130)
TRIGLYCERIDES: 53 mg/dL (ref <150)
Total CHOL/HDL Ratio: 2.4 Ratio (ref <=5.0)
VLDL: 11 mg/dL (ref <30)

## 2015-04-02 LAB — COMPREHENSIVE METABOLIC PANEL
ALBUMIN: 4.3 g/dL (ref 3.6–5.1)
ALT: 18 U/L (ref 6–29)
AST: 24 U/L (ref 10–35)
Alkaline Phosphatase: 140 U/L — ABNORMAL HIGH (ref 33–130)
BILIRUBIN TOTAL: 0.4 mg/dL (ref 0.2–1.2)
BUN: 17 mg/dL (ref 7–25)
CO2: 23 mmol/L (ref 20–31)
CREATININE: 0.66 mg/dL (ref 0.60–0.93)
Calcium: 9.8 mg/dL (ref 8.6–10.4)
Chloride: 106 mmol/L (ref 98–110)
Glucose, Bld: 109 mg/dL — ABNORMAL HIGH (ref 65–99)
Potassium: 4.2 mmol/L (ref 3.5–5.3)
SODIUM: 138 mmol/L (ref 135–146)
TOTAL PROTEIN: 7.7 g/dL (ref 6.1–8.1)

## 2015-04-02 LAB — CBC
HCT: 43 % (ref 36.0–46.0)
Hemoglobin: 14.5 g/dL (ref 12.0–15.0)
MCH: 30 pg (ref 26.0–34.0)
MCHC: 33.7 g/dL (ref 30.0–36.0)
MCV: 89 fL (ref 78.0–100.0)
MPV: 9.3 fL (ref 8.6–12.4)
PLATELETS: 349 10*3/uL (ref 150–400)
RBC: 4.83 MIL/uL (ref 3.87–5.11)
RDW: 13.6 % (ref 11.5–15.5)
WBC: 9.1 10*3/uL (ref 4.0–10.5)

## 2015-04-02 LAB — THYROID PANEL WITH TSH
Free Thyroxine Index: 3.8 (ref 1.4–3.8)
T3 Uptake: 31 % (ref 22–35)
T4 TOTAL: 12.4 ug/dL — AB (ref 4.5–12.0)
TSH: 1.612 u[IU]/mL (ref 0.350–4.500)

## 2015-04-02 MED ORDER — FLUTICASONE-SALMETEROL 500-50 MCG/DOSE IN AEPB: 1.0000 | INHALATION_SPRAY | Freq: Two times a day (BID) | RESPIRATORY_TRACT | Status: DC

## 2015-04-02 MED ORDER — ALBUTEROL SULFATE HFA 108 (90 BASE) MCG/ACT IN AERS: INHALATION_SPRAY | RESPIRATORY_TRACT | Status: DC

## 2015-04-02 MED ORDER — AMLODIPINE BESY-BENAZEPRIL HCL 5-20 MG PO CAPS: 1.0000 | ORAL_CAPSULE | Freq: Every evening | ORAL | Status: DC

## 2015-04-02 MED ORDER — MELOXICAM 7.5 MG PO TABS: 7.5000 mg | ORAL_TABLET | Freq: Every day | ORAL | Status: DC

## 2015-04-02 MED ORDER — NYSTATIN 100000 UNIT/ML MT SUSP: 5.0000 mL | Freq: Four times a day (QID) | OROMUCOSAL | Status: AC

## 2015-04-02 NOTE — Patient Instructions (Addendum)
Do hip exercises daily. Take mobic once a day for pain. I will call you with your lab tests. You meds have been refilled. Use nystatin wash 4 times a day for 10 days. Return in 1 month for follow up.   Generic Hip Exercises RANGE OF MOTION (ROM) AND STRETCHING EXERCISES  These exercises may help you when beginning to rehabilitate your injury. Doing them too aggressively can worsen your condition. Complete them slowly and gently. Your symptoms may resolve with or without further involvement from your physician, physical therapist or athletic trainer. While completing these exercises, remember:   Restoring tissue flexibility helps normal motion to return to the joints. This allows healthier, less painful movement and activity.  An effective stretch should be held for at least 30 seconds.  A stretch should never be painful. You should only feel a gentle lengthening or release in the stretched tissue. If these stretches worsen your symptoms even when done gently, consult your physician, physical therapist or athletic trainer. STRETCH - Hamstrings, Supine   Lie on your back. Loop a belt or towel over the ball of your right / left foot.  Straighten your right / left knee and slowly pull on the belt to raise your leg. Do not allow the right / left knee to bend. Keep your opposite leg flat on the floor.  Raise the leg until you feel a gentle stretch behind your right / left knee or thigh. Hold this position for __________ seconds. Repeat __________ times. Complete this stretch __________ times per day.  STRETCH - Hip Rotators   Lie on your back on a firm surface. Grasp your right / left knee with your right / left hand and your ankle with your opposite hand.  Keeping your hips and shoulders firmly planted, gently pull your right / left knee and rotate your lower leg toward your opposite shoulder until you feel a stretch in your buttocks.  Hold this stretch for __________ seconds. Repeat this  stretch __________ times. Complete this stretch __________ times per day. STRETCH - Hamstrings/Adductors, V-Sit   Sit on the floor with your legs extended in a large "V," keeping your knees straight.  With your head and chest upright, bend at your waist reaching for your right foot to stretch your left adductors.  You should feel a stretch in your left inner thigh. Hold for __________ seconds.  Return to the upright position to relax your leg muscles.  Continuing to keep your chest upright, bend straight forward at your waist to stretch your hamstrings.  You should feel a stretch behind both of your thighs and/or knees. Hold for __________ seconds.  Return to the upright position to relax your leg muscles.  Repeat steps 2 through 4 for opposite leg. Repeat __________ times. Complete this exercise __________ times per day.  STRETCHING - Hip Flexors, Lunge  Half kneel with your right / left knee on the floor and your opposite knee bent and directly over your ankle.  Keep good posture with your head over your shoulders. Tighten your buttocks to point your tailbone downward; this will prevent your back from arching too much.  You should feel a gentle stretch in the front of your thigh and/or hip. If you do not feel any resistance, slightly slide your opposite foot forward and then slowly lunge forward so your knee once again lines up over your ankle. Be sure your tailbone remains pointed downward.  Hold this stretch for __________ seconds. Repeat __________ times. Complete this  stretch __________ times per day. STRENGTHENING EXERCISES These exercises may help you when beginning to rehabilitate your injury. They may resolve your symptoms with or without further involvement from your physician, physical therapist or athletic trainer. While completing these exercises, remember:   Muscles can gain both the endurance and the strength needed for everyday activities through controlled  exercises.  Complete these exercises as instructed by your physician, physical therapist or athletic trainer. Progress the resistance and repetitions only as guided.  You may experience muscle soreness or fatigue, but the pain or discomfort you are trying to eliminate should never worsen during these exercises. If this pain does worsen, stop and make certain you are following the directions exactly. If the pain is still present after adjustments, discontinue the exercise until you can discuss the trouble with your clinician. STRENGTH - Hip Extensors, Bridge   Lie on your back on a firm surface. Bend your knees and place your feet flat on the floor.  Tighten your buttocks muscles and lift your bottom off the floor until your trunk is level with your thighs. You should feel the muscles in your buttocks and back of your thighs working. If you do not feel these muscles, slide your feet 1-2 inches further away from your buttocks.  Hold this position for __________ seconds.  Slowly lower your hips to the starting position and allow your buttock muscles relax completely before beginning the next repetition.  If this exercise is too easy, you may cross your arms over your chest. Repeat __________ times. Complete this exercise __________ times per day.  STRENGTH - Hip Abductors, Straight Leg Raises  Be aware of your form throughout the entire exercise so that you exercise the correct muscles. Sloppy form means that you are not strengthening the correct muscles.  Lie on your side so that your head, shoulders, knee and hip line up. You may bend your lower knee to help maintain your balance. Your right / left leg should be on top.  Roll your hips slightly forward, so that your hips are stacked directly over each other and your right / left knee is facing forward.  Lift your top leg up 4-6 inches, leading with your heel. Be sure that your foot does not drift forward or that your knee does not roll toward  the ceiling.  Hold this position for __________ seconds. You should feel the muscles in your outer hip lifting (you may not notice this until your leg begins to tire).  Slowly lower your leg to the starting position. Allow the muscles to fully relax before beginning the next repetition. Repeat __________ times. Complete this exercise __________ times per day.  STRENGTH - Hip Adductors, Straight Leg Raises   Lie on your side so that your head, shoulders, knee and hip line up. You may place your upper foot in front to help maintain your balance. Your right / left leg should be on the bottom.  Roll your hips slightly forward, so that your hips are stacked directly over each other and your right / left knee is facing forward.  Tense the muscles in your inner thigh and lift your bottom leg 4-6 inches. Hold this position for __________ seconds.  Slowly lower your leg to the starting position. Allow the muscles to fully relax before beginning the next repetition. Repeat __________ times. Complete this exercise __________ times per day.  STRENGTH - Quadriceps, Straight Leg Raises  Quality counts! Watch for signs that the quadriceps muscle is working to  insure you are strengthening the correct muscles and not "cheating" by substituting with healthier muscles.  Lay on your back with your right / left leg extended and your opposite knee bent.  Tense the muscles in the front of your right / left thigh. You should see either your knee cap slide up or increased dimpling just above the knee. Your thigh may even quiver.  Tighten these muscles even more and raise your leg 4 to 6 inches off the floor. Hold for right / left seconds.  Keeping these muscles tense, lower your leg.  Relax the muscles slowly and completely in between each repetition. Repeat __________ times. Complete this exercise __________ times per day.  STRENGTH - Hip Abductors, Standing  Tie one end of a rubber exercise band/tubing to a  secure surface (table, pole) and tie a loop at the other end.  Place the loop around your right / left ankle. Keeping your ankle with the band directly opposite of the secured end, step away until there is tension in the tube/band.  Hold onto a chair as needed for balance.  Keeping your back upright, your shoulders over your hips, and your toes pointing forward, lift your right / left leg out to your side. Be sure to lift your leg with your hip muscles. Do not "throw" your leg or tip your body to lift your leg.  Slowly and with control, return to the starting position. Repeat exercise __________ times. Complete this exercise __________ times per day.  STRENGTH - Quadriceps, Squats  Stand in a door frame so that your feet and knees are in line with the frame.  Use your hands for balance, not support, on the frame.  Slowly lower your weight, bending at the hips and knees. Keep your lower legs upright so that they are parallel with the door frame. Squat only within the range that does not increase your knee pain. Never let your hips drop below your knees.  Slowly return upright, pushing with your legs, not pulling with your hands.   This information is not intended to replace advice given to you by your health care provider. Make sure you discuss any questions you have with your health care provider.   Document Released: 06/24/2005 Document Revised: 06/27/2014 Document Reviewed: 09/18/2008 Elsevier Interactive Patient Education [employer]2016 [employer] Inc[employer]

## 2015-04-02 NOTE — Progress Notes (Signed)
[facility] [address], [place] [id number] [phone]  Date:  04/02/2015   Name:  Cheryl DarbyDonna E Carlucci   DOB:  12/24/1945   MRN:  [id number]  PCP:  Norberto SorensonSHAW,EVA, MD    Chief Complaint: Follow-up; Groin Pain; and Flu Vaccine   History of Present Illness:  This is a 69 y.o. female with PMH hypothyroidism, osteoporosis, HTN, HLD who is presenting for follow up.  Hypothyroidism - stable on synthroid 75 mg.  HTN - takes lotrel 5/20 mg. Has done well on this.  HLD- diet controlled. Last lipid panel 1 year ago with total cholesterol 208 and LDL 109.  Osteoporosis - was unable to tolerate bisphosphonates. Was referred to endocrinology in June 2016 and started on boniva. Pt states she never started this because she was unable to tolerate in the past. She states she was given an injection instead and was told she didn't need to follow up again for 1 year. I do not see a record of this injection in epic.  States throat is hurting x 2-3 days. Hurts to swallow and tongue is sore. She does not have any other uri sx. She states she usually washes her mouth after advair but not always.  Right hip hurting x several months. Hurts anteriorly. She has not had evaluated before.  Review of Systems:  Review of Systems See HPI  Patient Active Problem List   Diagnosis Date Noted  . Osteoporosis   . Mood disorder in conditions classified elsewhere 02/05/2013  . Severe hypothyroidism 01/24/2013  . 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 Hyperlipidemia 01/24/2013  . CANDIDIASIS, ORAL 03/06/2009  . Essential hypertension 05/09/2008  . ALLERGIC RHINITIS 05/09/2008  . Asthma 05/09/2008    Prior to Admission medications   Medication Sig Start Date End Date Taking? Authorizing Provider  albuterol (PROAIR HFA) 108 (90 BASE) MCG/ACT inhaler INHALE 2 PUFFS INTO THE LUNGS EVERY 6 HOURS AS NEEDED FOR WHEEZING 09/29/14  Yes [name], MD  amLODipine-benazepril (LOTREL) 5-20 MG per capsule  Take 1 capsule by mouth every evening. 09/29/14  Yes [name], MD  brimonidine (ALPHAGAN) 0.2 % ophthalmic solution Place 1 drop into both eyes 3 (three) times daily. 01/29/13  Yes [name], MD  Fluticasone-Salmeterol (ADVAIR DISKUS) 500-50 MCG/DOSE AEPB Inhale 1 puff into the lungs 2 (two) times daily. 09/29/14  Yes [name], MD  SYNTHROID 75 MCG tablet Take 1 tablet (75 mcg total) by mouth daily before breakfast. 02/11/15  Yes Chelle Jeffery, PA-C           Allergies  Allergen Reactions  . Aspirin     REACTION: asthma flare  . Codeine     REACTION: ulcer    Past Surgical History  Procedure Laterality Date  . Cholecystectomy    . Back surgery    . Nasal sinus surgery    . Knee surgery    . Tubal ligation    . Ear operations    . 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 Abdominal hysterectomy    . Thyroidectomy      Uncertain date    Social History  Substance Use Topics  . Smoking status: Never Smoker   . Smokeless tobacco: Never Used     Comment: EXPOSED TO SECOND HAND SMOKE AS CHILD  . Alcohol Use: No    Family History  Problem Relation Age of Onset  . Allergies Sister  X2  . Heart disease Mother   . Clotting disorder Mother   . Cancer Mother   . Osteoporosis Maternal Grandmother     Medication list has been reviewed and updated.  Physical Examination:  Physical Exam  Constitutional: She is oriented to person, place, and time. She appears well-developed and well-nourished. No distress.  HENT:  Head: Normocephalic and atraumatic.  Right Ear: Hearing normal.  Left Ear: Hearing normal.  Nose: Nose normal.  Mouth/Throat: Mucous membranes are normal.  White patches on tongue and soft palate  Eyes: Conjunctivae and lids are normal. Right eye exhibits no discharge. Left eye exhibits no discharge. No scleral icterus.  Cardiovascular: Normal rate, regular rhythm, normal heart sounds and normal pulses.   No murmur heard. Pulmonary/Chest: Effort normal and  breath sounds normal. No respiratory distress. She has no wheezes. She has no rhonchi. She has no rales.  Abdominal: Soft. Normal appearance. There is no tenderness.  Musculoskeletal:       Right hip: She exhibits decreased range of motion (decreased IR and ER) and tenderness (anterior groin). She exhibits normal strength and no bony tenderness.       Left hip: Normal.  No tenderness over trochanteric burse  Lymphadenopathy:    She has no cervical adenopathy.  Neurological: She is alert and oriented to person, place, and time.  Skin: Skin is warm, dry and intact. No lesion and no rash noted.  Psychiatric: She has a normal mood and affect. Her speech is normal and behavior is normal. Thought content normal.   BP 130/72 mmHg  Pulse 90  Temp(Src) 98 F (36.7 C) (Oral)  Resp 16  Ht 5' (1.524 m)  Wt 113 lb 3.2 oz (51.347 kg)  BMI 22.11 kg/m2  [facility] reading (PRIMARY) by  Dr. Clelia Croft: mild degenerative changes.  Assessment and Plan:  1. Severe hypothyroidism - Thyroid Panel With TSH  2. Hyperlipidemia - Lipid panel  3. Essential hypertension BP stable. Continue current meds. - Comprehensive metabolic panel - CBC - amLODipine-benazepril (LOTREL) 5-20 MG capsule; Take 1 capsule by mouth every evening.  Dispense: 90 capsule; Refill: 1  4. Hip pain, right Only mild degenerative changes of right hip on radiograph. She does have degenerative changes of lumbar spine and possible compression fracture. Gave pt exercises to do at home and she will take tylenol TID for pain. There is a possible compression fx of lumbar spine -- she does have a hx of osteoporosis and states she is getting injections for this with endocrinologist, Dr. Everardo All, however I see no record of this. She will f/u in 1 month to recheck. Will get lumbar films at that time. - DG HIP UNILAT W OR W/O PELVIS 2-3 VIEWS RIGHT; Future  5. Oral thrush - nystatin (MYCOSTATIN) 100000 UNIT/ML suspension; Take 5 mLs (500,000 Units  total) by mouth 4 (four) times daily. Retain in mouth as long as possible before swallowing.  Dispense: 60 mL; Refill: 0  6. Prediabetes - Hemoglobin A1c  7. Asthma, mild persistent, uncomplicated 8. dyspnea - Fluticasone-Salmeterol (ADVAIR DISKUS) 500-50 MCG/DOSE AEPB; Inhale 1 puff into the lungs 2 (two) times daily.  Dispense: 60 each; Refill: 5 - albuterol (PROAIR HFA) 108 (90 BASE) MCG/ACT inhaler; INHALE 2 PUFFS INTO THE LUNGS EVERY 6 HOURS AS NEEDED FOR WHEEZING  Dispense: 8.5 g; Refill: 5   Nicole V. Dyke Brackett, MHS [facility]  04/02/2015

## 2015-04-03 ENCOUNTER — Other Ambulatory Visit: Payer: Self-pay | Admitting: Family Medicine

## 2015-04-03 LAB — HEMOGLOBIN A1C
HEMOGLOBIN A1C: 6.2 % — AB (ref <5.7)
MEAN PLASMA GLUCOSE: 131 mg/dL — AB (ref <117)

## 2015-04-06 ENCOUNTER — Telehealth: Payer: Self-pay

## 2015-04-06 ENCOUNTER — Telehealth: Payer: Self-pay | Admitting: Family Medicine

## 2015-04-06 ENCOUNTER — Other Ambulatory Visit: Payer: Self-pay | Admitting: Physician Assistant

## 2015-04-06 DIAGNOSIS — E039 Hypothyroidism, unspecified: Secondary | ICD-10-CM

## 2015-04-06 MED ORDER — SYNTHROID 75 MCG PO TABS: 75.0000 ug | ORAL_TABLET | Freq: Every day | ORAL | Status: DC

## 2015-04-06 NOTE — Telephone Encounter (Signed)
Pt's husband concerned about pt's Syntrhoid rx. Says [facility] didn't get our last RF and they are only getting 30 q month. [facility] and they did not receive our RF on 8/23. Called in 6 mos worth and pt notified.

## 2015-04-06 NOTE — Telephone Encounter (Signed)
Refilled on behalf of [facility] who saw pt last wk.

## 2015-04-06 NOTE — Telephone Encounter (Signed)
Patient husband came into the appointment center requesting a medication refill for Cheryl [name]. Husband stated she need her medication today. Synthroid 75 MCG. Please contact patient at [phone] G

## 2015-04-07 NOTE — Telephone Encounter (Signed)
Husband notified  

## 2015-05-04 ENCOUNTER — Ambulatory Visit: Payer: BLUE CROSS/BLUE SHIELD | Admitting: Physician Assistant

## 2015-05-08 ENCOUNTER — Ambulatory Visit (INDEPENDENT_AMBULATORY_CARE_PROVIDER_SITE_OTHER): Payer: BLUE CROSS/BLUE SHIELD | Admitting: Emergency Medicine

## 2015-05-08 VITALS — BP 122/84 | HR 52 | Temp 97.9°F | Resp 16 | Ht 60.0 in | Wt 111.0 lb

## 2015-05-08 DIAGNOSIS — M25551 Pain in right hip: Secondary | ICD-10-CM

## 2015-05-08 MED ORDER — CELECOXIB 200 MG PO CAPS: 200.0000 mg | ORAL_CAPSULE | Freq: Two times a day (BID) | ORAL | Status: DC

## 2015-05-08 NOTE — Progress Notes (Signed)
Subjective:  Patient ID: Cheryl [name], female    DOB: 10/20/1945  Age: 69 y.o. MRN: [id number]  CC: Follow-up   HPI Cheryl DarbyDonna E Barbe presents  for recheck on her hip. She was seen on 04/02/2015 with right hip pain. She really has improved some but not significantly. She has pain with ambulation. She denies any history of injury or overuse. She has no radicular pain numbness tingling or weakness. She has no back pain.  History Lupita LeashDonna has a past medical history of Allergic rhinitis; Asthma; Emphysema; Hypertension; Abnormal heart rhythm; Partial deafness; Hyperlipidemia; Family history of anesthesia complication; Shortness of breath; Cardiomegaly; Hypothyroidism; and Osteoporosis.   She has past surgical history that includes Cholecystectomy; Back surgery; Nasal sinus surgery; Knee surgery; Tubal ligation; ear operations; Cleft palate repair; Abdominal hysterectomy; and Thyroidectomy.   Her  family history includes Allergies in her sister; Cancer in her mother; Clotting disorder in her mother; Heart disease in her mother; Osteoporosis in her maternal grandmother.  She   reports that she has never smoked. She has never used smokeless tobacco. She reports that she does not drink alcohol or use illicit drugs.  Outpatient Prescriptions Prior to Visit  Medication Sig Dispense Refill  . albuterol (PROAIR HFA) 108 (90 BASE) MCG/ACT inhaler INHALE 2 PUFFS INTO THE LUNGS EVERY 6 HOURS AS NEEDED FOR WHEEZING 8.5 g 5  . amLODipine-benazepril (LOTREL) 5-20 MG capsule Take 1 capsule by mouth every evening. 90 capsule 1  . brimonidine (ALPHAGAN) 0.2 % ophthalmic solution Place 1 drop into both eyes 3 (three) times daily. 5 mL 0  . Fluticasone-Salmeterol (ADVAIR DISKUS) 500-50 MCG/DOSE AEPB Inhale 1 puff into the lungs 2 (two) times daily. 60 each 5  . SYNTHROID 75 MCG tablet Take 1 tablet (75 mcg total) by mouth daily before breakfast. 90 tablet 1   No facility-administered medications prior to visit.      Social History   Social History  . Marital Status: Married    Spouse Name: N/A  . Number of Children: N/A  . Years of Education: N/A   Social History Main Topics  . Smoking status: Never Smoker   . Smokeless tobacco: Never Used     Comment: EXPOSED TO SECOND HAND SMOKE AS CHILD  . Alcohol Use: No  . Drug Use: No  . Sexual Activity: Not Asked   Other Topics Concern  . None   Social History Narrative     Review of Systems  Constitutional: Negative for fever, chills and appetite change.  HENT: Negative for congestion, ear pain, postnasal drip, sinus pressure and sore throat.   Eyes: Negative for pain and redness.  Respiratory: Negative for cough, shortness of breath and wheezing.   Cardiovascular: Negative for leg swelling.  Gastrointestinal: Negative for nausea, vomiting, abdominal pain, diarrhea, constipation and blood in stool.  Endocrine: Negative for polyuria.  Genitourinary: Negative for dysuria, urgency, frequency and flank pain.  Musculoskeletal: Negative for gait problem.  Skin: Negative for rash.  Neurological: Negative for weakness and headaches.  Psychiatric/Behavioral: Negative for confusion and decreased concentration. The patient is not nervous/anxious.     Objective:  BP 122/84 mmHg  Pulse 52  Temp(Src) 97.9 F (36.6 C) (Oral)  Resp 16  Ht 5' (1.524 m)  Wt 111 lb (50.349 kg)  BMI 21.68 kg/m2  SpO2 94%  Physical Exam  Constitutional: She is oriented to person, place, and time. She appears well-developed and well-nourished.  HENT:  Head: Normocephalic and atraumatic.  Eyes: Conjunctivae are  normal. Pupils are equal, round, and reactive to light.  Pulmonary/Chest: Effort normal.  Musculoskeletal: She exhibits no edema.  Neurological: She is alert and oriented to person, place, and time.  Skin: Skin is dry.  Psychiatric: She has a normal mood and affect. Her behavior is normal. Thought content normal.      Assessment & Plan:   There are  no diagnoses linked to this encounter. I am having Ms. Hellinger start on celecoxib. I am also having her maintain her brimonidine, amLODipine-benazepril, Fluticasone-Salmeterol, albuterol, and SYNTHROID.  Meds ordered this encounter  Medications  . celecoxib (CELEBREX) 200 MG capsule    Sig: Take 1 capsule (200 mg total) by mouth 2 (two) times daily.    Dispense:  30 capsule    Refill:  2    Appropriate red flag conditions were discussed with the patient as well as actions that should be taken.  Patient expressed his understanding.  Follow-up: Return if symptoms worsen or fail to improve.  Carmelina Dane, MD

## 2015-05-08 NOTE — Patient Instructions (Signed)

## 2015-10-31 ENCOUNTER — Ambulatory Visit (INDEPENDENT_AMBULATORY_CARE_PROVIDER_SITE_OTHER): Payer: BLUE CROSS/BLUE SHIELD

## 2015-10-31 ENCOUNTER — Other Ambulatory Visit: Payer: Self-pay | Admitting: Emergency Medicine

## 2015-10-31 ENCOUNTER — Ambulatory Visit (INDEPENDENT_AMBULATORY_CARE_PROVIDER_SITE_OTHER): Payer: BLUE CROSS/BLUE SHIELD | Admitting: Emergency Medicine

## 2015-10-31 VITALS — BP 122/72 | HR 66 | Temp 98.0°F | Resp 17 | Ht 60.5 in | Wt 110.0 lb

## 2015-10-31 DIAGNOSIS — J449 Chronic obstructive pulmonary disease, unspecified: Secondary | ICD-10-CM | POA: Diagnosis not present

## 2015-10-31 DIAGNOSIS — J4530 Mild persistent asthma, uncomplicated: Secondary | ICD-10-CM

## 2015-10-31 DIAGNOSIS — R9431 Abnormal electrocardiogram [ECG] [EKG]: Secondary | ICD-10-CM

## 2015-10-31 DIAGNOSIS — R7303 Prediabetes: Secondary | ICD-10-CM

## 2015-10-31 DIAGNOSIS — M25551 Pain in right hip: Secondary | ICD-10-CM

## 2015-10-31 DIAGNOSIS — E038 Other specified hypothyroidism: Secondary | ICD-10-CM

## 2015-10-31 DIAGNOSIS — E039 Hypothyroidism, unspecified: Secondary | ICD-10-CM

## 2015-10-31 DIAGNOSIS — E785 Hyperlipidemia, unspecified: Secondary | ICD-10-CM

## 2015-10-31 LAB — COMPLETE METABOLIC PANEL WITH GFR
ALBUMIN: 4.2 g/dL (ref 3.6–5.1)
ALK PHOS: 114 U/L (ref 33–130)
ALT: 21 U/L (ref 6–29)
AST: 28 U/L (ref 10–35)
BILIRUBIN TOTAL: 0.4 mg/dL (ref 0.2–1.2)
BUN: 16 mg/dL (ref 7–25)
CO2: 29 mmol/L (ref 20–31)
CREATININE: 0.66 mg/dL (ref 0.60–0.93)
Calcium: 10.4 mg/dL (ref 8.6–10.4)
Chloride: 106 mmol/L (ref 98–110)
GFR, EST NON AFRICAN AMERICAN: 89 mL/min (ref >=60)
GLUCOSE: 95 mg/dL (ref 65–99)
Potassium: 4.4 mmol/L (ref 3.5–5.3)
Sodium: 141 mmol/L (ref 135–146)
TOTAL PROTEIN: 7.8 g/dL (ref 6.1–8.1)

## 2015-10-31 LAB — POCT CBC
Granulocyte percent: 60.2 %G (ref 37–80)
HEMATOCRIT: 40.3 % (ref 37.7–47.9)
Hemoglobin: 13.7 g/dL (ref 12.2–16.2)
Lymph, poc: 2.8 (ref 0.6–3.4)
MCH, POC: 30.7 pg (ref 27–31.2)
MCHC: 34.1 g/dL (ref 31.8–35.4)
MCV: 89.9 fL (ref 80–97)
MID (CBC): 0.8 (ref 0–0.9)
MPV: 6.9 fL (ref 0–99.8)
POC GRANULOCYTE: 5.5 (ref 2–6.9)
POC LYMPH %: 31.3 % (ref 10–50)
POC MID %: 8.5 % (ref 0–12)
Platelet Count, POC: 318 10*3/uL (ref 142–424)
RBC: 4.48 M/uL (ref 4.04–5.48)
RDW, POC: 14.6 %
WBC: 9.1 10*3/uL (ref 4.6–10.2)

## 2015-10-31 LAB — POCT URINALYSIS DIP (MANUAL ENTRY)
BILIRUBIN UA: NEGATIVE
GLUCOSE UA: NEGATIVE
Ketones, POC UA: NEGATIVE
LEUKOCYTES UA: NEGATIVE
NITRITE UA: NEGATIVE
PH UA: 5.5
Protein Ur, POC: NEGATIVE
Spec Grav, UA: 1.01
Urobilinogen, UA: 0.2

## 2015-10-31 LAB — GLUCOSE, POCT (MANUAL RESULT ENTRY): POC GLUCOSE: 97 mg/dL (ref 70–99)

## 2015-10-31 LAB — POCT GLYCOSYLATED HEMOGLOBIN (HGB A1C): Hemoglobin A1C: 5.9

## 2015-10-31 LAB — TSH: TSH: 4.65 mIU/L — ABNORMAL HIGH

## 2015-10-31 NOTE — Progress Notes (Addendum)
Patient ID: Cheryl [name], female   DOB: 11-13-1945, 70 y.o.   MRN: [id number]   By signing my name below, I, Cheryl [name], attest that this documentation has been prepared under the direction and in the presence of Cheryl Gobble, MD Electronically Signed: [name], ED Scribe 10/31/2015 at 10:30 AM.  Chief Complaint:  Chief Complaint  Patient presents with  . Hip Injury    pre op    HPI: Cheryl [name] is a 70 y.o. female, with a h/o osteoporosis, who reports to [facility] today for medical clearance for right hip surgery. Pt states that she does not have a pulmonologist or cardiologist. She denies h/o tobacco use but states that her mother used tobacco while she was pregnant with her. Pt reports h/o emphysema and asthma; states she uses Advair x2 daily and albuterol approximately x2-3 weekly. She denies chest pain. No h/o DVT/PE.   Past Medical History  Diagnosis Date  . Allergic rhinitis   . Asthma   . Emphysema   . Hypertension   . Abnormal heart rhythm   . Partial deafness   . Hyperlipidemia   . Family history of anesthesia complication     TWIN SISTER HAS DELAYED REACTION   . Shortness of breath   . Cardiomegaly   . Hypothyroidism     SEVERE   . Osteoporosis    Past Surgical History  Procedure Laterality Date  . Cholecystectomy    . Back surgery    . Nasal sinus surgery    . Knee surgery    . Tubal ligation    . Ear operations    . 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 Abdominal hysterectomy    . Thyroidectomy      Uncertain date   Social History   Social History  . Marital Status: Married    Spouse Name: N/A  . Number of Children: N/A  . Years of Education: N/A   Social History Main Topics  . Smoking status: Never Smoker   . Smokeless tobacco: Never Used     Comment: EXPOSED TO SECOND HAND SMOKE AS CHILD  . Alcohol Use: No  . Drug Use: No  . Sexual Activity: Not Asked   Other Topics Concern  . None   Social History Narrative   Family History    Problem Relation Age of Onset  . Allergies Sister     X2  . Heart disease Mother   . Clotting disorder Mother   . Cancer Mother   . Osteoporosis Maternal Grandmother    Allergies  Allergen Reactions  . Aspirin     REACTION: asthma flare  . Codeine     REACTION: ulcer   Prior to Admission medications   Medication Sig Start Date End Date Taking? Authorizing Provider  albuterol (PROAIR HFA) 108 (90 BASE) MCG/ACT inhaler INHALE 2 PUFFS INTO THE LUNGS EVERY 6 HOURS AS NEEDED FOR WHEEZING 04/02/15  Yes Lanier Clam V, PA-C  amLODipine-benazepril (LOTREL) 5-20 MG capsule Take 1 capsule by mouth every evening. 04/02/15  Yes Lanier Clam V, PA-C  brimonidine (ALPHAGAN) 0.2 % ophthalmic solution Place 1 drop into both eyes 3 (three) times daily. 01/29/13  Yes [name], MD  Fluticasone-Salmeterol (ADVAIR DISKUS) 500-50 MCG/DOSE AEPB Inhale 1 puff into the lungs 2 (two) times daily. 04/02/15  Yes Lanier Clam V, PA-C  SYNTHROID 75 MCG tablet Take 1 tablet (75 mcg total) by mouth daily before breakfast. 04/06/15  Yes [name], MD  celecoxib (CELEBREX) 200 MG capsule Take 1 capsule (200 mg total) by mouth 2 (two) times daily. Patient not taking: Reported on 10/31/2015 05/08/15   Carmelina DaneJeffery S Anderson, MD   ROS: The patient denies fevers, chills, night sweats, unintentional weight loss, chest pain, palpitations, wheezing, nausea, vomiting, abdominal pain, dysuria, hematuria, melena, numbness, weakness, or tingling.   All other systems have been reviewed and were otherwise negative with the exception of those mentioned in the HPI and as above.    PHYSICAL EXAM: Filed Vitals:   10/31/15 0830  BP: 122/72  Pulse: 66  Temp: 98 F (36.7 C)  Resp: 17   Body mass index is 21.12 kg/(m^2).  General: Alert, no acute distress HEENT:  Normocephalic, atraumatic, oropharynx patent. Thyroidectomy scar Eye: [facility], [facility] Cardiovascular: Very irregular heart rhythm. no rubs murmurs or gallops. No Carotid  bruits, radial pulse intact. No pedal edema.  Respiratory: Increased AP diameter  Abdominal: No organomegaly, abdomen is soft and non-tender, positive bowel sounds. No masses. Musculoskeletal: very limited ROM of the R hip. Any movement of R hip illicits pain Skin: No rashes. Neurologic: Facial musculature symmetric. Psychiatric: Patient acts appropriately throughout our interaction. Lymphatic: No cervical or submandibular lymphadenopathy  LABS: Results for orders placed or performed in visit on 10/31/15  POCT CBC  Result Value Ref Range   WBC 9.1 4.6 - 10.2 K/uL   Lymph, poc 2.8 0.6 - 3.4   POC LYMPH PERCENT 31.3 10 - 50 %L   MID (cbc) 0.8 0 - 0.9   POC MID % 8.5 0 - 12 %M   POC Granulocyte 5.5 2 - 6.9   Granulocyte percent 60.2 37 - 80 %G   RBC 4.48 4.04 - 5.48 M/uL   Hemoglobin 13.7 12.2 - 16.2 g/dL   HCT, POC 47.840.3 29.537.7 - 47.9 %   MCV 89.9 80 - 97 fL   MCH, POC 30.7 27 - 31.2 pg   MCHC 34.1 31.8 - 35.4 g/dL   RDW, POC 62.114.6 %   Platelet Count, POC 318 142 - 424 K/uL   MPV 6.9 0 - 99.8 fL  POCT glucose (manual entry)  Result Value Ref Range   POC Glucose 97 70 - 99 mg/dl  POCT glycosylated hemoglobin (Hb A1C)  Result Value Ref Range   Hemoglobin A1C 5.9   POCT urinalysis dipstick  Result Value Ref Range   Color, UA yellow yellow   Clarity, UA clear clear   Glucose, UA negative negative   Bilirubin, UA negative negative   Ketones, POC UA negative negative   Spec Grav, UA 1.010    Blood, UA trace-intact (A) negative   pH, UA 5.5    Protein Ur, POC negative negative   Urobilinogen, UA 0.2    Nitrite, UA Negative Negative   Leukocytes, UA Negative Negative   EKG/XRAY:   [facility] Dg Chest 2 View  10/31/2015  CLINICAL DATA:  Preop hip surgery. EXAM: CHEST  2 VIEW COMPARISON:  01/24/2013 FINDINGS: Lungs are slightly hyperexpanded without consolidation or effusion. Mild emphysematous disease. Cardiomediastinal silhouette is within  normal. Remainder the exam is unchanged. IMPRESSION: No acute cardiopulmonary disease. Mild emphysematous disease. Electronically Signed   By: Elberta Fortisaniel  Boyle M.D.   On: 10/31/2015 09:20   ASSESSMENT/PLAN: Pulmonary function test reveals severe lung disease both restrictive and obstructive. Her FVC was 53% of predicted with an FEV1 of 35% of predicted. Her EKG is abnormal with  a limited R-wave in V2 suspicious for previous inferior injury. This patient is high risk for surgery. A routine labs were done here. Referral made to cardiology and to pulmonary to get clearance for her planned upcoming surgery. She is up-to-date on pneumococcal vaccines.I personally performed the services described in this documentation, which was scribed in my presence. The recorded information has been reviewed and is accurate.    Gross sideeffects, risk and benefits, and alternatives of medications d/w patient. Patient is aware that all medications have potential sideeffects and we are unable to predict every sideeffect or drug-drug interaction that may occur.  Lesle Chris MD 10/31/2015 8:33 AM

## 2015-10-31 NOTE — Patient Instructions (Addendum)
I have placed referrals. Be seen by a pulmonary specialist and a cardiologist. You will need clearance from these 2 specialists prior to having surgery. Medical clearance will occur through our office with your pending blood work.    IF you received an x-ray today, you will receive an invoice from [facility]. Please contact [facility] at [phone] with questions or concerns regarding your invoice.   IF you received labwork today, you will receive an invoice from [facility]. Please contact [facility] at [phone] with questions or concerns regarding your invoice.   Our billing staff will not be able to assist you with questions regarding bills from these companies.  You will be contacted with the lab results as soon as they are available. The fastest way to get your results is to activate your My Chart account. Instructions are located on the last page of this paperwork. If you have not heard from us regarding the results in 2 weeks, please contact this office.

## 2015-11-02 ENCOUNTER — Telehealth: Payer: Self-pay | Admitting: Pulmonary Disease

## 2015-11-02 LAB — T4, FREE: FREE T4: 1.1 ng/dL (ref 0.8–1.8)

## 2015-11-02 NOTE — Telephone Encounter (Signed)
Called spoke with pt. She states she needs surgical clearance for a total hip replacement on her right hip. She states that the surgery has not been scheduled yet and that she will need surgical clearance before they schedule it. I explained to her that she has not been seen since 2014 and that she will need to have an ov with [facility]. I informed her that VS's first available is in August 2017. She is requesting a sooner appointment. I explained that I would need to send a message to [facility] to get an approval for a work in appointment. She voiced understanding and had no further questions.  VS please advise if okay

## 2015-11-03 NOTE — Telephone Encounter (Signed)
Pt scheduled for surgical clearance with Dr Vassie LollAlva 11/04/15 at 2:15pm. Nothing further needed.

## 2015-11-03 NOTE — Telephone Encounter (Signed)
She was actually patient of Dr. Vassie LollAlva.  I saw her only once as an acute visit in 2014.  Please route request to Dr. Vassie LollAlva.

## 2015-11-04 ENCOUNTER — Ambulatory Visit (INDEPENDENT_AMBULATORY_CARE_PROVIDER_SITE_OTHER): Payer: Self-pay | Admitting: Pulmonary Disease

## 2015-11-04 ENCOUNTER — Encounter: Payer: Self-pay | Admitting: Pulmonary Disease

## 2015-11-04 VITALS — BP 148/82 | HR 59 | Ht 60.0 in | Wt 110.6 lb

## 2015-11-04 DIAGNOSIS — J4540 Moderate persistent asthma, uncomplicated: Secondary | ICD-10-CM

## 2015-11-04 DIAGNOSIS — I10 Essential (primary) hypertension: Secondary | ICD-10-CM

## 2015-11-04 NOTE — Patient Instructions (Signed)
You are cleared for hip surgery Stay on Advair 500/50 twice daily-rinse mouth after use

## 2015-11-04 NOTE — Assessment & Plan Note (Signed)
We'll clear her for non-pulmonary surgery I think her persistent airway obstruction is related to chronic asthma-as well as not reaching peak lung function status in adult life  She has not had any exacerbations in nothing to suggest active symptoms

## 2015-11-04 NOTE — Progress Notes (Signed)
Subjective:    Patient ID: Cheryl DarbyDonna E Huang, female    DOB: 10/22/1945, 70 y.o.   MRN: [id number]  HPI  70 year old never smoker with persistent asthma  She describes asthma since puberty, last prednisone use 5 -6 yrs ago, prednisone use in the past was complicated by duodenal ulcer perforation.  Triggers - cats, chlorox, bleach type chemicals, stress   11/04/2015   Chief Complaint  Patient presents with  . Follow-up    needs surgical clearance for Hip Replacement surgery; no concerns.   Last ER visit 2014 Compliant with advair She needs bilateral hip replacements and has seen orthopedics and surgery is planned. She need surgical clearance today She wants to know if she has COPD She had no ER visits since her last visit with us in 2014-she has been maintained on Advair 500/50 which has worked well for her-denies nocturnal wheezing No symptoms of GERD or sinus drip  Chest x-ray 10/31/15 shows hyperinflation Echo 01/2013 shows normal LV function  Spirometry shows worsening airway obstruction with FEV1 of 45%, 0.82 and FVC of 73% Postbronchodilator testing was not performed  Significant tests/ events  05/2008>>spirometry on advair 500/50 >> moderate reversible airway obstruction c/w inadequately treated asthma, singulair added   08/2011 Spirometry >> mod severe airway obstruction - fev1 51%   Past Medical History  Diagnosis Date  . Allergic rhinitis   . Asthma   . Emphysema   . Hypertension   . Abnormal heart rhythm   . Partial deafness   . Hyperlipidemia   . Family history of anesthesia complication     TWIN SISTER HAS DELAYED REACTION   . Shortness of breath   . Cardiomegaly   . Hypothyroidism     SEVERE   . Osteoporosis     Past Surgical History  Procedure Laterality Date  . Cholecystectomy    . Back surgery    . Nasal sinus surgery    . Knee surgery    . Tubal ligation    . Ear operations    . 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 Abdominal  hysterectomy    . Thyroidectomy      Uncertain date    Allergies  Allergen Reactions  . Aspirin     REACTION: asthma flare  . Codeine     REACTION: ulcer    Social History   Social History  . Marital Status: Married    Spouse Name: N/A  . Number of Children: N/A  . Years of Education: N/A   Occupational History  . Not on file.   Social History Main Topics  . Smoking status: Never Smoker   . Smokeless tobacco: Never Used     Comment: EXPOSED TO SECOND HAND SMOKE AS CHILD  . Alcohol Use: No  . Drug Use: No  . Sexual Activity: Not on file   Other Topics Concern  . Not on file   Social History Narrative    Family History  Problem Relation Age of Onset  . Allergies Sister     X2  . Heart disease Mother   . Clotting disorder Mother   . Cancer Mother   . Osteoporosis Maternal Grandmother      Review of Systems  neg for any significant sore throat, dysphagia, itching, sneezing, nasal congestion or excess/ purulent secretions, fever, chills, sweats, unintended wt loss, pleuritic or exertional cp, hempoptysis, orthopnea pnd or change in chronic leg swelling. Also denies presyncope, palpitations, heartburn, abdominal  pain, nausea, vomiting, diarrhea or change in bowel or urinary habits, dysuria,hematuria, rash, arthralgias, visual complaints, headache, numbness weakness or ataxia.     Objective:   Physical Exam  Gen. Pleasant, thin woman, in no distress ENT - no lesions, no post nasal drip Neck: No JVD, no thyromegaly, no carotid bruits Lungs: no use of accessory muscles, no dullness to percussion, decreased without rales or rhonchi  Cardiovascular: Rhythm regular, heart sounds  normal, no murmurs or gallops, no peripheral edema Musculoskeletal: bL foot  deformities, no cyanosis or clubbing        Assessment & Plan:

## 2015-11-04 NOTE — Assessment & Plan Note (Signed)
She has no symptoms attributable to ACE inhibitor and okay to continue for now

## 2015-11-19 ENCOUNTER — Encounter: Payer: Self-pay | Admitting: *Deleted

## 2015-11-24 NOTE — Progress Notes (Signed)
Cardiology Office Note   Date:  11/25/2015   ID:  Cheryl DarbyDonna E Blowe, DOB 08/14/1945, MRN [id number]  PCP:  Norberto SorensonSHAW,EVA, MD  Cardiologist:   Regan LemmingWill Martin Ruvi Fullenwider, MD    Chief Complaint  Patient presents with  . New Patient (Initial Visit)  . Palpitations  . Shortness of Breath  . Chest Pain     History of Present Illness: Cheryl [name] is a 70 y.o. female who presents today for cardiology evaluation.   She presents for evaluation pre-right total hip arthroplasty. She says the majority of her symptoms are due to her asthma. She does not get chest pain. She says that she can lie flat without having any issues. She sleeps on one pillow at night and does not have any episodes of PND. She does say that when she walks on flat ground, she is limited by shortness of breath from her asthma. She can climb one flight of stairs and have minimal shortness of breath at the top of the stairs.   Today, she denies symptoms of palpitations, chest pain, shortness of breath, orthopnea, PND, lower extremity edema, claudication, dizziness, presyncope, syncope, bleeding, or neurologic sequela. The patient is tolerating medications without difficulties and is otherwise without complaint today.    Past Medical History  Diagnosis Date  . Allergic rhinitis   . Asthma   . Emphysema   . Hypertension   . Abnormal heart rhythm   . Partial deafness   . Hyperlipidemia   . Family history of anesthesia complication     TWIN SISTER HAS DELAYED REACTION   . Shortness of breath   . Cardiomegaly   . Hypothyroidism     SEVERE   . Osteoporosis    Past Surgical History  Procedure Laterality Date  . Cholecystectomy    . Back surgery    . Nasal sinus surgery    . Knee surgery    . Tubal ligation    . Ear operations    . 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 Abdominal hysterectomy    . Thyroidectomy      Uncertain date     Current Outpatient Prescriptions  Medication Sig Dispense Refill  . albuterol (PROAIR  HFA) 108 (90 BASE) MCG/ACT inhaler INHALE 2 PUFFS INTO THE LUNGS EVERY 6 HOURS AS NEEDED FOR WHEEZING 8.5 g 5  . amLODipine-benazepril (LOTREL) 5-20 MG capsule Take 1 capsule by mouth every evening. 90 capsule 1  . bimatoprost (LUMIGAN) 0.03 % ophthalmic solution 1 drop at bedtime.    . Fluticasone-Salmeterol (ADVAIR DISKUS) 500-50 MCG/DOSE AEPB Inhale 1 puff into the lungs 2 (two) times daily. 60 each 5  . SYNTHROID 75 MCG tablet Take 1 tablet (75 mcg total) by mouth daily before breakfast. 90 tablet 1   No current facility-administered medications for this visit.    Allergies:   Aspirin and Codeine   Social History:  The patient  reports that she has never smoked. She has never used smokeless tobacco. She reports that she does not drink alcohol or use illicit drugs.   Family History:  The patient's family history includes Allergies in her sister; Cancer in her mother; Clotting disorder in her mother; Heart disease in her mother; Osteoporosis in her maternal grandmother.    ROS:  Please see the history of present illness.   Otherwise, review of systems is positive for none.   All other systems are reviewed and negative.    PHYSICAL EXAM: VS:  BP 152/80 mmHg  Pulse 44  Ht 5' (1.524 m)  Wt 109 lb 12.8 oz (49.805 kg)  BMI 21.44 kg/m2 , BMI Body mass index is 21.44 kg/(m^2). GEN: Well nourished, well developed, in no acute distress HEENT: normal Neck: no JVD, carotid bruits, or masses Cardiac: bradycardic; no murmurs, rubs, or gallops,no edema  Respiratory:  clear to auscultation bilaterally, normal work of breathing GI: soft, nontender, nondistended, + BS MS: no deformity or atrophy Skin: warm and dry Neuro:  Strength and sensation are intact Psych: euthymic mood, full affect  EKG:  EKG is not ordered today ECG on 5/13 shows sinus bradycardia  Recent Labs: 04/02/2015: Platelets 349 10/31/2015: ALT 21; BUN 16; Creat 0.66; Hemoglobin 13.7; Potassium 4.4; Sodium 141; TSH 4.65*     Lipid Panel     Component Value Date/Time   CHOL 227* 04/02/2015 1041   TRIG 53 04/02/2015 1041   HDL 96 04/02/2015 1041   CHOLHDL 2.4 04/02/2015 1041   VLDL 11 04/02/2015 1041   LDLCALC 120 04/02/2015 1041     Wt Readings from Last 3 Encounters:  11/25/15 109 lb 12.8 oz (49.805 kg)  11/04/15 110 lb 9.6 oz (50.168 kg)  10/31/15 110 lb (49.896 kg)      Other studies Reviewed: Additional studies/ records that were reviewed today include: TTE 2014  Review of the above records today demonstrates:  - Left ventricle: The cavity size was normal. Systolic function was normal. The estimated ejection fraction was in the range of 55% to 60%. Wall motion was normal; there were no regional wall motion abnormalities. - Left atrium: The atrium was mildly dilated. - Atrial septum: There was an atrial septal aneurysm.   ASSESSMENT AND PLAN:  1.  Pre operative evaluation: Patient does not endorse any signs or symptoms of coronary disease or heart failure. Due to that, she would be at an low to intermediate risk for an intermediate risk procedure. She does have some high blood pressure today, and would continue her blood pressure medications through the time of her procedure. She is also bradycardic, and therefore would avoid any rate slowing medications such as beta blockers or calcium channel blockers if possible.    Current medicines are reviewed at length with the patient today.   The patient does not have concerns regarding her medicines.  The following changes were made today:  none  Labs/ tests ordered today include:  No orders of the defined types were placed in this encounter.     Disposition:   FU with [facility] PRN  Signed, Ronrico Dupin Jorja Loa, MD  11/25/2015 4:02 PM     [facility] [address] [place] [address] [phone] (office) [phone] (fax)

## 2015-11-25 ENCOUNTER — Encounter: Payer: Self-pay | Admitting: Cardiology

## 2015-11-25 ENCOUNTER — Other Ambulatory Visit: Payer: Self-pay | Admitting: Physician Assistant

## 2015-11-25 ENCOUNTER — Ambulatory Visit (INDEPENDENT_AMBULATORY_CARE_PROVIDER_SITE_OTHER): Payer: BLUE CROSS/BLUE SHIELD | Admitting: Cardiology

## 2015-11-25 VITALS — BP 152/80 | HR 44 | Ht 60.0 in | Wt 109.8 lb

## 2015-11-25 DIAGNOSIS — Z01810 Encounter for preprocedural cardiovascular examination: Secondary | ICD-10-CM | POA: Diagnosis not present

## 2015-11-25 NOTE — Patient Instructions (Addendum)
Medication Instructions:  Your physician recommends that you continue on your current medications as directed. Please refer to the Current Medication list given to you today.  Labwork: None ordered  Testing/Procedures: None ordered  Follow-Up: No follow up is needed at this time with Dr. Elberta Fortisamnitz.  He will see you on an as needed basis.  He will send clearance to orthopedic office.  If you need a refill on your cardiac medications before your next appointment, please call your pharmacy.  Thank you for choosing [facility]!!   Dory HornSherri Price, RN [phone]

## 2015-12-08 ENCOUNTER — Other Ambulatory Visit: Payer: Self-pay | Admitting: Orthopaedic Surgery

## 2015-12-09 ENCOUNTER — Encounter (HOSPITAL_COMMUNITY): Payer: Self-pay

## 2015-12-09 ENCOUNTER — Encounter (HOSPITAL_COMMUNITY)
Admission: RE | Admit: 2015-12-09 | Discharge: 2015-12-09 | Disposition: A | Discharge status: Home / Self Care | Payer: BLUE CROSS/BLUE SHIELD | Source: Ambulatory Visit | Attending: Orthopaedic Surgery | Admitting: Orthopaedic Surgery

## 2015-12-09 DIAGNOSIS — E039 Hypothyroidism, unspecified: Secondary | ICD-10-CM | POA: Insufficient documentation

## 2015-12-09 DIAGNOSIS — Z0183 Encounter for blood typing: Secondary | ICD-10-CM | POA: Insufficient documentation

## 2015-12-09 DIAGNOSIS — E785 Hyperlipidemia, unspecified: Secondary | ICD-10-CM | POA: Insufficient documentation

## 2015-12-09 DIAGNOSIS — Z01818 Encounter for other preprocedural examination: Secondary | ICD-10-CM | POA: Diagnosis not present

## 2015-12-09 DIAGNOSIS — I10 Essential (primary) hypertension: Secondary | ICD-10-CM | POA: Diagnosis not present

## 2015-12-09 DIAGNOSIS — M1611 Unilateral primary osteoarthritis, right hip: Secondary | ICD-10-CM | POA: Diagnosis not present

## 2015-12-09 DIAGNOSIS — J439 Emphysema, unspecified: Secondary | ICD-10-CM | POA: Insufficient documentation

## 2015-12-09 DIAGNOSIS — Z01812 Encounter for preprocedural laboratory examination: Secondary | ICD-10-CM | POA: Diagnosis not present

## 2015-12-09 DIAGNOSIS — Z79899 Other long term (current) drug therapy: Secondary | ICD-10-CM | POA: Diagnosis not present

## 2015-12-09 HISTORY — DX: Pneumonia, unspecified organism: J18.9

## 2015-12-09 HISTORY — DX: Unspecified osteoarthritis, unspecified site: M19.90

## 2015-12-09 HISTORY — DX: Nonrheumatic mitral (valve) insufficiency: I34.0

## 2015-12-09 HISTORY — DX: Cardiac murmur, unspecified: R01.1

## 2015-12-09 LAB — URINALYSIS, ROUTINE W REFLEX MICROSCOPIC
Bilirubin Urine: NEGATIVE
Glucose, UA: NEGATIVE mg/dL
Ketones, ur: NEGATIVE mg/dL
NITRITE: NEGATIVE
PROTEIN: NEGATIVE mg/dL
SPECIFIC GRAVITY, URINE: 1.022 (ref 1.005–1.030)
pH: 5.5 (ref 5.0–8.0)

## 2015-12-09 LAB — CBC WITH DIFFERENTIAL/PLATELET
BASOS PCT: 0 %
Basophils Absolute: 0 10*3/uL (ref 0.0–0.1)
EOS ABS: 0.7 10*3/uL (ref 0.0–0.7)
Eosinophils Relative: 7 %
HEMATOCRIT: 43.2 % (ref 36.0–46.0)
HEMOGLOBIN: 14.1 g/dL (ref 12.0–15.0)
Lymphocytes Relative: 34 %
Lymphs Abs: 3.5 10*3/uL (ref 0.7–4.0)
MCH: 29.2 pg (ref 26.0–34.0)
MCHC: 32.6 g/dL (ref 30.0–36.0)
MCV: 89.4 fL (ref 78.0–100.0)
MONOS PCT: 7 %
Monocytes Absolute: 0.7 10*3/uL (ref 0.1–1.0)
Neutro Abs: 5.5 10*3/uL (ref 1.7–7.7)
Neutrophils Relative %: 52 %
Platelets: 321 10*3/uL (ref 150–400)
RBC: 4.83 MIL/uL (ref 3.87–5.11)
RDW: 14.2 % (ref 11.5–15.5)
WBC: 10.4 10*3/uL (ref 4.0–10.5)

## 2015-12-09 LAB — URINE MICROSCOPIC-ADD ON
BACTERIA UA: NONE SEEN
RBC / HPF: NONE SEEN RBC/hpf (ref 0–5)

## 2015-12-09 LAB — COMPREHENSIVE METABOLIC PANEL
ALBUMIN: 3.6 g/dL (ref 3.5–5.0)
ALK PHOS: 114 U/L (ref 38–126)
ALT: 16 U/L (ref 14–54)
ANION GAP: 8 (ref 5–15)
AST: 24 U/L (ref 15–41)
BILIRUBIN TOTAL: 0.3 mg/dL (ref 0.3–1.2)
BUN: 11 mg/dL (ref 6–20)
CALCIUM: 9.9 mg/dL (ref 8.9–10.3)
CO2: 25 mmol/L (ref 22–32)
CREATININE: 0.63 mg/dL (ref 0.44–1.00)
Chloride: 107 mmol/L (ref 101–111)
GFR calc Af Amer: >60 mL/min (ref >60)
GFR calc non Af Amer: >60 mL/min (ref >60)
GLUCOSE: 114 mg/dL — AB (ref 65–99)
Potassium: 4 mmol/L (ref 3.5–5.1)
SODIUM: 140 mmol/L (ref 135–145)
TOTAL PROTEIN: 7.6 g/dL (ref 6.5–8.1)

## 2015-12-09 LAB — SURGICAL PCR SCREEN
MRSA, PCR: NEGATIVE
Staphylococcus aureus: POSITIVE — AB

## 2015-12-09 LAB — ABO/RH: ABO/RH(D): O POS

## 2015-12-09 LAB — TYPE AND SCREEN
ABO/RH(D): O POS
Antibody Screen: NEGATIVE

## 2015-12-09 LAB — APTT: aPTT: 31 seconds (ref 24–37)

## 2015-12-09 LAB — PROTIME-INR
INR: 1 (ref 0.00–1.49)
Prothrombin Time: 13.4 seconds (ref 11.6–15.2)

## 2015-12-09 NOTE — Pre-Procedure Instructions (Addendum)
Cheryl DarbyDonna E [name]  12/09/2015      [facility] [id number] Cheryl [name], [place] - [address] [facility] [address] [place] [place] [id number] Phone: [phone] Fax: [phone]    Your procedure is scheduled on 12/16/15.  [facility] Ad[facility] at [phone] A.M.  Call this number if you have problems the morning of surgery:  [phone]   Remember:  Do not eat food or drink liquids after midnight.  Take these medicines the morning of surgery with A SIP OF WATER,proair inhaler(bring with you),advair diskus as needed,synthroid  STOP all herbel meds, nsaids (aleve,naproxen,advil,ibuprofen) 5 days prior to surgery including vitamins, aspirin   Do not wear jewelry, make-up or nail polish.  Do not wear lotions, powders, or perfumes.  You may wear deoderant.  Do not shave 48 hours prior to surgery.  Men may shave face and neck.  Do not bring valuables to the hospital.  [facility] is not responsible for any belongings or valuables.  Contacts, dentures or bridgework may not be worn into surgery.  Leave your suitcase in the car.  After surgery it may be brought to your room.  For patients admitted to the hospital, discharge time will be determined by your treatment team.  Patients discharged the day of surgery will not be allowed to drive home.   Name and phone number of your driver:  Special instructions:   Special Instructions: [place] - Preparing for Surgery  Before surgery, you can play an important role.  Because skin is not sterile, your skin needs to be as free of germs as possible.  You can reduce the number of germs on you skin by washing with CHG (chlorahexidine gluconate) soap before surgery.  CHG is an antiseptic cleaner which kills germs and bonds with the skin to continue killing germs even after washing.  Please DO NOT use if you have an allergy to CHG or antibacterial soaps.  If your skin becomes reddened/irritated  stop using the CHG and inform your nurse when you arrive at Short Stay.  Do not shave (including legs and underarms) for at least 48 hours prior to the first CHG shower.  You may shave your face.  Please follow these instructions carefully:   1.  Shower with CHG Soap the night before surgery and the morning of Surgery.  2.  If you choose to wash your hair, wash your hair first as usual with your normal shampoo.  3.  After you shampoo, rinse your hair and body thoroughly to remove the Shampoo.  4.  Use CHG as you would any other liquid soap.  You can apply chg directly  to the skin and wash gently with scrungie or a clean washcloth.  5.  Apply the CHG Soap to your body ONLY FROM THE NECK DOWN.  Do not use on open wounds or open sores.  Avoid contact with your eyes ears, mouth and genitals (private parts).  Wash genitals (private parts)       with your normal soap.  6.  Wash thoroughly, paying special attention to the area where your surgery will be performed.  7.  Thoroughly rinse your body with warm water from the neck down.  8.  DO NOT shower/wash with your normal soap after using and rinsing off the CHG Soap.  9.  Pat yourself dry with a clean towel.  10.  Wear clean pajamas.            11.  Place clean sheets on your bed the night of your first shower and do not sleep with pets.  Day of Surgery  Do not apply any lotions/deodorants the morning of surgery.  Please wear clean clothes to the hospital/surgery center.  Please read over the following fact sheets that you were given. Pain Booklet and MRSA Information

## 2015-12-10 NOTE — Progress Notes (Signed)
Anesthesia Chart Review:  Pt is a 70 year old female scheduled for R total hip arthroplasty anterior approach on 12/16/15 with Naiping Xu.   PMH includes:  HTN, heart murmur, cardiomegaly, HTN, hyperlipidemia, emphysema, asthma, hypothyroidism. Never smoker. BMI 21.5  Medications include: amlodipine-benazepril, advair, albuterol, synthroid  Preoperative labs reviewed.    Chest x-ray 10/31/15 reviewed. No acute cardiopulmonary disease. Mild emphysematous disease.  EKG 10/31/15: Sinus bradycardia (52 bpm). Old anterior infarct.   Echo 01/24/13:  - Left ventricle: The cavity size was normal. Systolic function was normal. The estimated ejection fraction wasin the range of 55% to 60%. Wall motion was normal; there were no regional wall motion abnormalities. - Left atrium: The atrium was mildly dilated. - Atrial septum: There was an atrial septal aneurysm. - Pericardium, extracardiac: There was a left pleural effusion.  Pt saw Will Elberta Fortisamnitz, MD with cardiology for pre-op eval 11/24/15 and was cleared for surgery at low to intermediate risk.   Pulmonologist is Cyril Mourningakesh Alva, MD who has cleared pt for surgery.   If no changes, I anticipate pt can proceed with surgery as scheduled.   Rica Mastngela Clint Strupp, FNP-BC [facility]Anesthesiology Phone: [phone] 12/10/2015 1:10 PM

## 2015-12-15 MED ORDER — TRANEXAMIC ACID 1000 MG/10ML IV SOLN
1000.0000 mg | INTRAVENOUS | Status: DC
  Filled 2015-12-15: qty 10

## 2015-12-15 MED ORDER — CEFAZOLIN SODIUM-DEXTROSE 2-4 GM/100ML-% IV SOLN: 2.0000 g | INTRAVENOUS | Status: DC

## 2015-12-16 ENCOUNTER — Encounter (HOSPITAL_COMMUNITY)
Admission: RE | Disposition: A | Discharge status: Home Health | Payer: Self-pay | Source: Ambulatory Visit | Attending: Orthopaedic Surgery

## 2015-12-16 ENCOUNTER — Inpatient Hospital Stay (HOSPITAL_COMMUNITY): Payer: BLUE CROSS/BLUE SHIELD

## 2015-12-16 ENCOUNTER — Inpatient Hospital Stay (HOSPITAL_COMMUNITY): Payer: BLUE CROSS/BLUE SHIELD | Admitting: Emergency Medicine

## 2015-12-16 ENCOUNTER — Inpatient Hospital Stay (HOSPITAL_COMMUNITY)
Admission: RE | Admit: 2015-12-16 | Discharge: 2015-12-18 | DRG: 470 | Disposition: A | Discharge status: Home Health | Payer: BLUE CROSS/BLUE SHIELD | Source: Ambulatory Visit | Attending: Orthopaedic Surgery | Admitting: Orthopaedic Surgery

## 2015-12-16 ENCOUNTER — Encounter (HOSPITAL_COMMUNITY): Payer: Self-pay | Admitting: Anesthesiology

## 2015-12-16 ENCOUNTER — Inpatient Hospital Stay (HOSPITAL_COMMUNITY): Payer: BLUE CROSS/BLUE SHIELD | Admitting: Anesthesiology

## 2015-12-16 DIAGNOSIS — M810 Age-related osteoporosis without current pathological fracture: Secondary | ICD-10-CM | POA: Diagnosis present

## 2015-12-16 DIAGNOSIS — E785 Hyperlipidemia, unspecified: Secondary | ICD-10-CM | POA: Diagnosis present

## 2015-12-16 DIAGNOSIS — Z96641 Presence of right artificial hip joint: Secondary | ICD-10-CM

## 2015-12-16 DIAGNOSIS — Z886 Allergy status to analgesic agent status: Secondary | ICD-10-CM | POA: Diagnosis not present

## 2015-12-16 DIAGNOSIS — Z885 Allergy status to narcotic agent status: Secondary | ICD-10-CM

## 2015-12-16 DIAGNOSIS — E039 Hypothyroidism, unspecified: Secondary | ICD-10-CM | POA: Diagnosis present

## 2015-12-16 DIAGNOSIS — Z419 Encounter for procedure for purposes other than remedying health state, unspecified: Secondary | ICD-10-CM

## 2015-12-16 DIAGNOSIS — J45909 Unspecified asthma, uncomplicated: Secondary | ICD-10-CM | POA: Diagnosis present

## 2015-12-16 DIAGNOSIS — Z8249 Family history of ischemic heart disease and other diseases of the circulatory system: Secondary | ICD-10-CM

## 2015-12-16 DIAGNOSIS — M1611 Unilateral primary osteoarthritis, right hip: Secondary | ICD-10-CM | POA: Diagnosis present

## 2015-12-16 DIAGNOSIS — Z96649 Presence of unspecified artificial hip joint: Secondary | ICD-10-CM

## 2015-12-16 DIAGNOSIS — D62 Acute posthemorrhagic anemia: Secondary | ICD-10-CM | POA: Diagnosis not present

## 2015-12-16 DIAGNOSIS — I10 Essential (primary) hypertension: Secondary | ICD-10-CM | POA: Diagnosis present

## 2015-12-16 DIAGNOSIS — I340 Nonrheumatic mitral (valve) insufficiency: Secondary | ICD-10-CM | POA: Diagnosis present

## 2015-12-16 DIAGNOSIS — J439 Emphysema, unspecified: Secondary | ICD-10-CM | POA: Diagnosis present

## 2015-12-16 DIAGNOSIS — H9190 Unspecified hearing loss, unspecified ear: Secondary | ICD-10-CM | POA: Diagnosis present

## 2015-12-16 DIAGNOSIS — Z79899 Other long term (current) drug therapy: Secondary | ICD-10-CM | POA: Diagnosis not present

## 2015-12-16 HISTORY — PX: TOTAL HIP ARTHROPLASTY: SHX124

## 2015-12-16 HISTORY — DX: Duodenal ulcer, unspecified as acute or chronic, without hemorrhage or perforation: K26.9

## 2015-12-16 SURGERY — ARTHROPLASTY, HIP, TOTAL, ANTERIOR APPROACH
Anesthesia: Spinal | Site: Hip | Laterality: Right

## 2015-12-16 MED ORDER — TRANEXAMIC ACID 1000 MG/10ML IV SOLN
1000.0000 mg | INTRAVENOUS | Status: AC
  Administered 2015-12-16: 1000 mg via INTRAVENOUS
  Filled 2015-12-16: qty 10

## 2015-12-16 MED ORDER — ACETAMINOPHEN 325 MG PO TABS: 650.0000 mg | ORAL_TABLET | Freq: Four times a day (QID) | ORAL | Status: DC | PRN

## 2015-12-16 MED ORDER — LATANOPROST 0.005 % OP SOLN
1.0000 [drp] | Freq: Every day | OPHTHALMIC | Status: DC
  Administered 2015-12-16 – 2015-12-17 (×2): 1 [drp] via OPHTHALMIC
  Filled 2015-12-16: qty 2.5

## 2015-12-16 MED ORDER — MENTHOL 3 MG MT LOZG: 1.0000 | LOZENGE | OROMUCOSAL | Status: DC | PRN

## 2015-12-16 MED ORDER — HYDROMORPHONE HCL 1 MG/ML IJ SOLN
INTRAMUSCULAR | Status: AC
  Filled 2015-12-16: qty 1

## 2015-12-16 MED ORDER — DEXTROSE 5 % IV SOLN
INTRAVENOUS | Status: DC | PRN
  Administered 2015-12-16 (×2): via INTRAVENOUS

## 2015-12-16 MED ORDER — SODIUM CHLORIDE 0.9 % IR SOLN
Status: DC | PRN
  Administered 2015-12-16: 1000 mL
  Administered 2015-12-16: 3000 mL

## 2015-12-16 MED ORDER — TRANEXAMIC ACID 1000 MG/10ML IV SOLN
2000.0000 mg | INTRAVENOUS | Status: AC
  Administered 2015-12-16: 2000 mg via TOPICAL
  Filled 2015-12-16: qty 20

## 2015-12-16 MED ORDER — HYDROMORPHONE HCL 4 MG PO TABS: 4.0000 mg | ORAL_TABLET | ORAL | Status: DC | PRN

## 2015-12-16 MED ORDER — AMLODIPINE BESYLATE 5 MG PO TABS
5.0000 mg | ORAL_TABLET | Freq: Every day | ORAL | Status: DC
  Administered 2015-12-17: 5 mg via ORAL
  Filled 2015-12-16 (×2): qty 1

## 2015-12-16 MED ORDER — ALBUTEROL SULFATE (2.5 MG/3ML) 0.083% IN NEBU: 3.0000 mL | INHALATION_SOLUTION | RESPIRATORY_TRACT | Status: DC | PRN

## 2015-12-16 MED ORDER — METHOCARBAMOL 750 MG PO TABS: 750.0000 mg | ORAL_TABLET | Freq: Two times a day (BID) | ORAL | Status: DC | PRN

## 2015-12-16 MED ORDER — VANCOMYCIN HCL IN DEXTROSE 1-5 GM/200ML-% IV SOLN
1000.0000 mg | INTRAVENOUS | Status: AC
  Administered 2015-12-16: 1000 mg via INTRAVENOUS
  Filled 2015-12-16: qty 200

## 2015-12-16 MED ORDER — ONDANSETRON HCL 4 MG PO TABS: 4.0000 mg | ORAL_TABLET | Freq: Three times a day (TID) | ORAL | Status: DC | PRN

## 2015-12-16 MED ORDER — MORPHINE SULFATE (PF) 2 MG/ML IV SOLN: 1.0000 mg | INTRAVENOUS | Status: DC | PRN

## 2015-12-16 MED ORDER — CEFAZOLIN SODIUM-DEXTROSE 2-4 GM/100ML-% IV SOLN
INTRAVENOUS | Status: AC
  Administered 2015-12-16: 2 g via INTRAVENOUS
  Filled 2015-12-16: qty 100

## 2015-12-16 MED ORDER — HYDROMORPHONE HCL 1 MG/ML IJ SOLN
0.5000 mg | INTRAMUSCULAR | Status: AC | PRN
  Administered 2015-12-16 (×2): 0.5 mg via INTRAVENOUS
  Administered 2015-12-16 (×2): 0.25 mg via INTRAVENOUS

## 2015-12-16 MED ORDER — SORBITOL 70 % SOLN: 30.0000 mL | Freq: Every day | Status: DC | PRN

## 2015-12-16 MED ORDER — HYDROMORPHONE HCL 1 MG/ML IJ SOLN
INTRAMUSCULAR | Status: AC
  Administered 2015-12-16: 0.25 mg via INTRAVENOUS
  Filled 2015-12-16: qty 1

## 2015-12-16 MED ORDER — VANCOMYCIN HCL IN DEXTROSE 1-5 GM/200ML-% IV SOLN
1000.0000 mg | Freq: Two times a day (BID) | INTRAVENOUS | Status: AC
  Administered 2015-12-17 (×2): 1000 mg via INTRAVENOUS
  Filled 2015-12-16 (×2): qty 200

## 2015-12-16 MED ORDER — CHLORHEXIDINE GLUCONATE 4 % EX LIQD: 60.0000 mL | Freq: Once | CUTANEOUS | Status: DC

## 2015-12-16 MED ORDER — ACETAMINOPHEN 500 MG PO TABS: 500.0000 mg | ORAL_TABLET | Freq: Four times a day (QID) | ORAL | Status: AC | PRN

## 2015-12-16 MED ORDER — LACTATED RINGERS IV SOLN
INTRAVENOUS | Status: DC
  Administered 2015-12-16: 12:00:00 via INTRAVENOUS

## 2015-12-16 MED ORDER — PROPOFOL 500 MG/50ML IV EMUL
INTRAVENOUS | Status: DC | PRN
Start: 2015-12-16 — End: 2015-12-16
  Administered 2015-12-16: 10 ug/kg/min via INTRAVENOUS

## 2015-12-16 MED ORDER — ALUM & MAG HYDROXIDE-SIMETH 200-200-20 MG/5ML PO SUSP: 30.0000 mL | ORAL | Status: DC | PRN

## 2015-12-16 MED ORDER — ONDANSETRON HCL 4 MG PO TABS: 4.0000 mg | ORAL_TABLET | Freq: Four times a day (QID) | ORAL | Status: DC | PRN

## 2015-12-16 MED ORDER — DIPHENHYDRAMINE HCL 12.5 MG/5ML PO ELIX: 25.0000 mg | ORAL_SOLUTION | ORAL | Status: DC | PRN

## 2015-12-16 MED ORDER — FLUTICASONE FUROATE-VILANTEROL 200-25 MCG/INH IN AEPB
1.0000 | INHALATION_SPRAY | Freq: Every day | RESPIRATORY_TRACT | Status: DC
  Administered 2015-12-17 – 2015-12-18 (×2): 1 via RESPIRATORY_TRACT
  Filled 2015-12-16: qty 28

## 2015-12-16 MED ORDER — SODIUM CHLORIDE 0.9 % IV SOLN
INTRAVENOUS | Status: DC
  Administered 2015-12-16: 19:00:00 via INTRAVENOUS

## 2015-12-16 MED ORDER — FENTANYL CITRATE (PF) 250 MCG/5ML IJ SOLN
INTRAMUSCULAR | Status: AC
  Filled 2015-12-16: qty 5

## 2015-12-16 MED ORDER — METOCLOPRAMIDE HCL 5 MG PO TABS: 5.0000 mg | ORAL_TABLET | Freq: Three times a day (TID) | ORAL | Status: DC | PRN

## 2015-12-16 MED ORDER — METHOCARBAMOL 500 MG PO TABS
500.0000 mg | ORAL_TABLET | Freq: Four times a day (QID) | ORAL | Status: DC | PRN
  Administered 2015-12-17: 500 mg via ORAL
  Filled 2015-12-16: qty 1

## 2015-12-16 MED ORDER — LEVOTHYROXINE SODIUM 75 MCG PO TABS
75.0000 ug | ORAL_TABLET | Freq: Every day | ORAL | Status: DC
  Administered 2015-12-17 – 2015-12-18 (×2): 75 ug via ORAL
  Filled 2015-12-16 (×2): qty 1

## 2015-12-16 MED ORDER — BUPIVACAINE LIPOSOME 1.3 % IJ SUSP
20.0000 mL | INTRAMUSCULAR | Status: AC
  Administered 2015-12-16: 20 mL
  Filled 2015-12-16: qty 20

## 2015-12-16 MED ORDER — ACETAMINOPHEN 650 MG RE SUPP: 650.0000 mg | Freq: Four times a day (QID) | RECTAL | Status: DC | PRN

## 2015-12-16 MED ORDER — PROPOFOL 10 MG/ML IV BOLUS
INTRAVENOUS | Status: AC
  Filled 2015-12-16: qty 20

## 2015-12-16 MED ORDER — 0.9 % SODIUM CHLORIDE (POUR BTL) OPTIME
TOPICAL | Status: DC | PRN
  Administered 2015-12-16: 1000 mL

## 2015-12-16 MED ORDER — POVIDONE-IODINE 10 % EX SOLN
CUTANEOUS | Status: DC | PRN
  Administered 2015-12-16: 1 via TOPICAL

## 2015-12-16 MED ORDER — SODIUM CHLORIDE 0.9 % IJ SOLN
INTRAMUSCULAR | Status: DC | PRN
  Administered 2015-12-16: 40 mL

## 2015-12-16 MED ORDER — EPHEDRINE SULFATE 50 MG/ML IJ SOLN
INTRAMUSCULAR | Status: DC | PRN
  Administered 2015-12-16 (×2): 10 mg via INTRAVENOUS

## 2015-12-16 MED ORDER — FENTANYL CITRATE (PF) 100 MCG/2ML IJ SOLN
INTRAMUSCULAR | Status: DC | PRN
  Administered 2015-12-16: 50 ug via INTRAVENOUS
  Administered 2015-12-16: 25 ug via INTRAVENOUS

## 2015-12-16 MED ORDER — EPHEDRINE 5 MG/ML INJ
INTRAVENOUS | Status: AC
  Filled 2015-12-16: qty 10

## 2015-12-16 MED ORDER — TRANEXAMIC ACID 1000 MG/10ML IV SOLN
1000.0000 mg | Freq: Once | INTRAVENOUS | Status: AC
  Administered 2015-12-16: 1000 mg via INTRAVENOUS
  Filled 2015-12-16: qty 10

## 2015-12-16 MED ORDER — LIDOCAINE HCL (CARDIAC) 20 MG/ML IV SOLN
INTRAVENOUS | Status: DC | PRN
  Administered 2015-12-16: 50 mg via INTRAVENOUS

## 2015-12-16 MED ORDER — GLYCOPYRROLATE 0.2 MG/ML IV SOSY
PREFILLED_SYRINGE | INTRAVENOUS | Status: AC
  Filled 2015-12-16: qty 3

## 2015-12-16 MED ORDER — ACETAMINOPHEN 500 MG PO TABS
1000.0000 mg | ORAL_TABLET | Freq: Four times a day (QID) | ORAL | Status: AC
  Administered 2015-12-16 – 2015-12-17 (×4): 1000 mg via ORAL
  Filled 2015-12-16 (×4): qty 2

## 2015-12-16 MED ORDER — MIDAZOLAM HCL 2 MG/2ML IJ SOLN
INTRAMUSCULAR | Status: AC
  Filled 2015-12-16: qty 2

## 2015-12-16 MED ORDER — BENAZEPRIL HCL 20 MG PO TABS
20.0000 mg | ORAL_TABLET | Freq: Every day | ORAL | Status: DC
  Administered 2015-12-17 – 2015-12-18 (×2): 20 mg via ORAL
  Filled 2015-12-16 (×2): qty 1

## 2015-12-16 MED ORDER — HYDROMORPHONE HCL 2 MG PO TABS
2.0000 mg | ORAL_TABLET | ORAL | Status: DC | PRN
  Administered 2015-12-17 – 2015-12-18 (×6): 2 mg via ORAL
  Filled 2015-12-16 (×5): qty 1

## 2015-12-16 MED ORDER — SENNOSIDES-DOCUSATE SODIUM 8.6-50 MG PO TABS: 1.0000 | ORAL_TABLET | Freq: Every evening | ORAL | Status: DC | PRN

## 2015-12-16 MED ORDER — PHENOL 1.4 % MT LIQD: 1.0000 | OROMUCOSAL | Status: DC | PRN

## 2015-12-16 MED ORDER — GLYCOPYRROLATE 0.2 MG/ML IJ SOLN
INTRAMUSCULAR | Status: DC | PRN
  Administered 2015-12-16: .15 mg via INTRAVENOUS

## 2015-12-16 MED ORDER — DEXAMETHASONE SODIUM PHOSPHATE 10 MG/ML IJ SOLN
10.0000 mg | Freq: Once | INTRAMUSCULAR | Status: AC
  Administered 2015-12-17: 10 mg via INTRAVENOUS
  Filled 2015-12-16: qty 1

## 2015-12-16 MED ORDER — METHOCARBAMOL 1000 MG/10ML IJ SOLN: 500.0000 mg | Freq: Four times a day (QID) | INTRAVENOUS | Status: DC | PRN

## 2015-12-16 MED ORDER — RIVAROXABAN 10 MG PO TABS: 10.0000 mg | ORAL_TABLET | Freq: Every day | ORAL | Status: DC

## 2015-12-16 MED ORDER — ONDANSETRON HCL 4 MG/2ML IJ SOLN: 4.0000 mg | Freq: Four times a day (QID) | INTRAMUSCULAR | Status: DC | PRN

## 2015-12-16 MED ORDER — MAGNESIUM CITRATE PO SOLN: 1.0000 | Freq: Once | ORAL | Status: DC | PRN

## 2015-12-16 MED ORDER — LIDOCAINE 2% (20 MG/ML) 5 ML SYRINGE
INTRAMUSCULAR | Status: AC
  Filled 2015-12-16: qty 5

## 2015-12-16 MED ORDER — ONDANSETRON HCL 4 MG/2ML IJ SOLN: 4.0000 mg | Freq: Once | INTRAMUSCULAR | Status: DC | PRN

## 2015-12-16 MED ORDER — POLYETHYLENE GLYCOL 3350 17 G PO PACK: 17.0000 g | PACK | Freq: Every day | ORAL | Status: DC | PRN

## 2015-12-16 MED ORDER — RIVAROXABAN 10 MG PO TABS
10.0000 mg | ORAL_TABLET | Freq: Every day | ORAL | Status: DC
  Administered 2015-12-17 – 2015-12-18 (×2): 10 mg via ORAL
  Filled 2015-12-16 (×2): qty 1

## 2015-12-16 MED ORDER — METOCLOPRAMIDE HCL 5 MG/ML IJ SOLN: 5.0000 mg | Freq: Three times a day (TID) | INTRAMUSCULAR | Status: DC | PRN

## 2015-12-16 MED ORDER — AMLODIPINE BESY-BENAZEPRIL HCL 5-20 MG PO CAPS: 1.0000 | ORAL_CAPSULE | Freq: Every evening | ORAL | Status: DC

## 2015-12-16 SURGICAL SUPPLY — 48 items
BAG DECANTER FOR FLEXI CONT (MISCELLANEOUS) ×3 IMPLANT
CAPT HIP TOTAL 2 ×3 IMPLANT
CELLS DAT CNTRL 66122 CELL SVR (MISCELLANEOUS) ×1 IMPLANT
COVER SURGICAL LIGHT HANDLE (MISCELLANEOUS) ×3 IMPLANT
DRAPE C-ARM 42X72 X-RAY (DRAPES) ×3 IMPLANT
DRAPE STERI IOBAN 125X83 (DRAPES) ×3 IMPLANT
DRAPE U-SHAPE 47X51 STRL (DRAPES) ×9 IMPLANT
DRSG AQUACEL AG ADV 3.5X10 (GAUZE/BANDAGES/DRESSINGS) ×3 IMPLANT
DURAPREP 26ML APPLICATOR (WOUND CARE) ×6 IMPLANT
ELECT BLADE 4.0 EZ CLEAN MEGAD (MISCELLANEOUS) ×3
ELECT REM PT RETURN 9FT ADLT (ELECTROSURGICAL) ×3
ELECTRODE BLDE 4.0 EZ CLN MEGD (MISCELLANEOUS) ×1 IMPLANT
ELECTRODE REM PT RTRN 9FT ADLT (ELECTROSURGICAL) ×1 IMPLANT
GLOVE SKINSENSE NS SZ7.5 (GLOVE) ×2
GLOVE SKINSENSE STRL SZ7.5 (GLOVE) ×1 IMPLANT
GLOVE SURG SYN 7.5  E (GLOVE) ×4
GLOVE SURG SYN 7.5 E (GLOVE) ×2 IMPLANT
GOWN SRG XL XLNG 56XLVL 4 (GOWN DISPOSABLE) ×1 IMPLANT
GOWN STRL NON-REIN XL XLG LVL4 (GOWN DISPOSABLE) ×3
GOWN STRL REUS W/ TWL LRG LVL3 (GOWN DISPOSABLE) IMPLANT
GOWN STRL REUS W/TWL LRG LVL3 (GOWN DISPOSABLE)
HANDPIECE INTERPULSE COAX TIP (DISPOSABLE) ×3
HOOD PEEL AWAY FLYTE STAYCOOL (MISCELLANEOUS) ×6 IMPLANT
IV NS 1000ML (IV SOLUTION) ×3
IV NS 1000ML BAXH (IV SOLUTION) ×1 IMPLANT
IV NS IRRIG 3000ML ARTHROMATIC (IV SOLUTION) ×3 IMPLANT
KIT BASIN OR (CUSTOM PROCEDURE TRAY) ×3 IMPLANT
MARKER SKIN DUAL TIP RULER LAB (MISCELLANEOUS) ×3 IMPLANT
NEEDLE SPNL 18GX3.5 QUINCKE PK (NEEDLE) ×3 IMPLANT
PACK TOTAL JOINT (CUSTOM PROCEDURE TRAY) ×3 IMPLANT
PACK UNIVERSAL I (CUSTOM PROCEDURE TRAY) ×3 IMPLANT
RTRCTR WOUND ALEXIS 18CM MED (MISCELLANEOUS) ×3
SAW OSC TIP CART 19.5X105X1.3 (SAW) ×3 IMPLANT
SEALER BIPOLAR AQUA 6.0 (INSTRUMENTS) ×3 IMPLANT
SET HNDPC FAN SPRY TIP SCT (DISPOSABLE) ×1 IMPLANT
STAPLER VISISTAT 35W (STAPLE) IMPLANT
SUT ETHIBOND 2 V 37 (SUTURE) ×3 IMPLANT
SUT ETHIBOND NAB CT1 #1 30IN (SUTURE) IMPLANT
SUT MNCRL AB 4-0 PS2 18 (SUTURE) ×3 IMPLANT
SUT VIC AB 1 CT1 27 (SUTURE) ×3
SUT VIC AB 1 CT1 27XBRD ANBCTR (SUTURE) ×1 IMPLANT
SUT VIC AB 2-0 CT1 27 (SUTURE) ×3
SUT VIC AB 2-0 CT1 TAPERPNT 27 (SUTURE) ×1 IMPLANT
SYR 20CC LL (SYRINGE) ×3 IMPLANT
SYR 50ML LL SCALE MARK (SYRINGE) ×3 IMPLANT
TOWEL OR 17X26 10 PK STRL BLUE (TOWEL DISPOSABLE) ×3 IMPLANT
TRAY CATH 16FR W/PLASTIC CATH (SET/KITS/TRAYS/PACK) IMPLANT
YANKAUER SUCT BULB TIP NO VENT (SUCTIONS) ×3 IMPLANT

## 2015-12-16 NOTE — Discharge Instructions (Signed)

## 2015-12-16 NOTE — Progress Notes (Signed)
Orthopedic Tech Progress Note Patient Details:  Cheryl DarbyDonna E [name] 01/07/1946 [id number] Applied OHF with trapeze to pt.'s bed. Patient ID: Cheryl [name], female   DOB: 04/23/1946, 70 y.o.   MRN: [id number]   Cheryl [name], Cheryl [name] 12/16/2015, 7:49 PM

## 2015-12-16 NOTE — Anesthesia Preprocedure Evaluation (Addendum)
Anesthesia Evaluation  Patient identified by MRN, date of birth, ID band Patient awake    Reviewed: Allergy & Precautions, NPO status , reviewed documented beta blocker date and time   Airway Mallampati: I  TM Distance: >3 FB     Dental   Pulmonary shortness of breath, asthma , pneumonia, COPD,    Pulmonary exam normal        Cardiovascular hypertension, Normal cardiovascular exam+ dysrhythmias      Neuro/Psych    GI/Hepatic   Endo/Other  Hypothyroidism   Renal/GU      Musculoskeletal  (+) Arthritis ,   Abdominal   Peds  Hematology   Anesthesia Other Findings   Reproductive/Obstetrics                            Anesthesia Physical Anesthesia Plan  ASA: III  Anesthesia Plan: Spinal   Post-op Pain Management:    Induction: Intravenous  Airway Management Planned: Mask  Additional Equipment:   Intra-op Plan:   Post-operative Plan:   Informed Consent: I have reviewed the patients History and Physical, chart, labs and discussed the procedure including the risks, benefits and alternatives for the proposed anesthesia with the patient or authorized representative who has indicated his/her understanding and acceptance.     Plan Discussed with: CRNA, Anesthesiologist and Surgeon  Anesthesia Plan Comments:         Anesthesia Quick Evaluation

## 2015-12-16 NOTE — H&P (Signed)
PREOPERATIVE H&P  Chief Complaint: Advanced arthritis right hip  HPI: Cheryl [name] is a 70 y.o. female who presents for surgical treatment of Advanced arthritis right hip.  She denies any changes in medical history.  Past Medical History  Diagnosis Date  . Allergic rhinitis   . Asthma   . Emphysema   . Hypertension   . Abnormal heart rhythm   . Partial deafness   . Hyperlipidemia   . Shortness of breath   . Cardiomegaly   . Hypothyroidism     SEVERE   . Osteoporosis   . Family history of anesthesia complication     TWIN SISTER HAS DELAYED REACTION -with "caine" meds  . Heart murmur   . Mitral valve regurgitation   . Pneumonia     hx  . Arthritis    Past Surgical History  Procedure Laterality Date  . Cholecystectomy    . Nasal sinus surgery    . Knee surgery    . Tubal ligation    . Ear operations    . 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 Abdominal hysterectomy    . Thyroidectomy      Uncertain date  . Back surgery      denies   Social History   Social History  . Marital Status: Married    Spouse Name: N/A  . Number of Children: N/A  . Years of Education: N/A   Social History Main Topics  . Smoking status: Never Smoker   . Smokeless tobacco: Never Used     Comment: EXPOSED TO SECOND HAND SMOKE AS CHILD  . Alcohol Use: No  . Drug Use: No  . Sexual Activity: Yes    Birth Control/ Protection: Condom   Other Topics Concern  . None   Social History Narrative   Family History  Problem Relation Age of Onset  . Allergies Sister     X2  . Heart disease Mother   . Clotting disorder Mother   . Cancer Mother   . Osteoporosis Maternal Grandmother    Allergies  Allergen Reactions  . Aspirin     REACTION: asthma flare  . Codeine     REACTION: ulcer   Prior to Admission medications   Medication Sig Start Date End Date Taking? Authorizing Provider  amLODipine-benazepril (LOTREL) 5-20 MG capsule Take 1 capsule by mouth every evening. 04/02/15   Yes Dorna LeitzNicole V Bush, PA-C  bimatoprost (LUMIGAN) 0.03 % ophthalmic solution Place 1 drop into both eyes at bedtime.    Yes Historical Provider, MD  Fluticasone-Salmeterol (ADVAIR DISKUS) 500-50 MCG/DOSE AEPB Inhale 1 puff into the lungs 2 (two) times daily. 04/02/15  Yes Roswell MinersNicole V Bush, PA-C  PROAIR HFA 108 (90 Base) MCG/ACT inhaler INHALE 2 PUFFS BY MOUTH( INTO THE LUNGS) EVERY 6 HOURS AS NEEDED FOR WHEEZING 11/25/15  Yes Dorna LeitzNicole V Bush, PA-C  SYNTHROID 75 MCG tablet Take 1 tablet (75 mcg total) by mouth daily before breakfast. 04/06/15  Yes [name], MD     Positive ROS: All other systems have been reviewed and were otherwise negative with the exception of those mentioned in the HPI and as above.  Physical Exam: General: Alert, no acute distress Cardiovascular: No pedal edema Respiratory: No cyanosis, no use of accessory musculature GI: abdomen soft Skin: No lesions in the area of chief complaint Neurologic: Sensation intact distally Psychiatric: Patient is competent for consent with normal mood and affect Lymphatic: no lymphedema  MUSCULOSKELETAL: exam stable  Assessment: Advanced arthritis right hip  Plan: Plan for Procedure(s): RIGHT TOTAL HIP ARTHROPLASTY ANTERIOR APPROACH  The risks benefits and alternatives were discussed with the patient including but not limited to the risks of nonoperative treatment, versus surgical intervention including infection, bleeding, nerve injury,  blood clots, cardiopulmonary complications, morbidity, mortality, among others, and they were willing to proceed.   Cheral AlmasXu, Cheryl Levi Michael, MD   12/16/2015 12:30 PM

## 2015-12-16 NOTE — Anesthesia Procedure Notes (Signed)
Spinal Patient location during procedure: OR Start time: 12/16/2015 1:20 PM End time: 12/16/2015 1:23 PM Staffing Anesthesiologist: Laverle HobbySMITH, Quashon Jesus Performed by: anesthesiologist  Preanesthetic Checklist Completed: patient identified, site marked, surgical consent, pre-op evaluation, timeout performed, IV checked, risks and benefits discussed and monitors and equipment checked Spinal Block Patient position: right lateral decubitus Prep: ChloraPrep Patient monitoring: heart rate, cardiac monitor, continuous pulse ox and blood pressure Approach: midline Location: L3-4 Injection technique: single-shot Needle Needle type: Quincke  Needle gauge: 22 G Needle length: 9 cm Needle insertion depth: 4 cm Assessment Sensory level: T10 Additional Notes Pt accepts procedure w/ risks. 12 mg 0.75% Marcaine w/ epi w/o difficulty. GES

## 2015-12-16 NOTE — Op Note (Signed)
RIGHT TOTAL HIP ARTHROPLASTY ANTERIOR APPROACH  Procedure Note Cheryl [name]   [id number]  Pre-op Diagnosis: Advanced arthritis right hip     Post-op Diagnosis: same   Operative Procedures  1. Total hip replacement; Right hip; uncemented [id number]   Personnel  Surgeon(s): Tarry KosNaiping M Xu, MD   Anesthesia: spinal  Prosthesis: Depuy Acetabulum: Pinnacle 50 mm Femur: Corail KA 10 Head: 32 mm size: +1 Liner: neutral Bearing Type: ceramic on poly  Date of Service: 12/16/2015  Total Hip Arthroplasty (Anterior Approach) Op Note:  After informed consent was obtained and the operative extremity marked in the holding area, the patient was brought back to the operating room and placed supine on the HANA table. Next, the operative extremity was prepped and draped in normal sterile fashion. Surgical timeout occurred verifying patient identification, surgical site, surgical procedure and administration of antibiotics.  A modified anterior Smith-Peterson approach to the hip was performed, using the interval between tensor fascia lata and sartorius.  Dissection was carried bluntly down onto the anterior hip capsule. The lateral femoral circumflex vessels were identified and coagulated. A capsulotomy was performed and the capsular flaps tagged for later repair.  Fluoroscopy was utilized to prepare for the femoral neck cut. The neck osteotomy was performed. The femoral head was removed, the acetabular rim was cleared of soft tissue and attention was turned to reaming the acetabulum.  Sequential reaming was performed under fluoroscopic guidance. We reamed to a size 49 mm, and then impacted the acetabular shell. The liner was then placed after irrigation and attention turned to the femur.  After placing the femoral hook, the leg was taken to externally rotated, extended and adducted position taking care to perform soft tissue releases to allow for adequate mobilization of the femur. Soft tissue was cleared  from the shoulder of the greater trochanter and the hook elevator used to improve exposure of the proximal femur. Sequential broaching performed up to a size 10. Trial neck and head were placed. The leg was brought back up to neutral and the construct reduced. The position and sizing of components, offset and leg lengths were checked using fluoroscopy. Stability of the construct was checked in extension and external rotation without any subluxation or impingement of prosthesis. We dislocated the prosthesis, dropped the leg back into position, removed trial components, and irrigated copiously. The final stem and head was then placed, the leg brought back up, the system reduced and fluoroscopy used to verify positioning.  We irrigated, obtained hemostasis and closed the capsule using #2 ethibond suture.  Dilute betadyne solution was used. The fascia was closed with #1 vicryl plus, the deep fat layer was closed with 0 vicryl, the subcutaneous layers closed with 2.0 Vicryl Plus and the skin closed with 4.0 monocryl and steri strips. A sterile dressing was applied. The patient was awakened in the operating room and taken to recovery in stable condition.  All sponge, needle, and instrument counts were correct at the end of the case.   Position: supine  Complications: none.  Time Out: performed   Drains/Packing: none  Estimated blood loss: 100 cc  Returned to Recovery Room: in good condition.   Antibiotics: yes   Mechanical VTE (DVT) Prophylaxis: sequential compression devices, TED thigh-high  Chemical VTE (DVT) Prophylaxis: xarelto   Fluid Replacement: see anesthesia record  Specimens Removed: 1 to pathology   Sponge and Instrument Count Correct? yes   PACU: portable radiograph - low AP   Admission: inpatient status, start PT &  OT POD#1  Plan/RTC: Return in 2 weeks for staple removal. Return in 6 weeks to see MD.  Weight Bearing/Load Lower Extremity: full  Hip precautions: none Suture  Removal: 10-14 days  Betadine to incision twice daily once dressing is removed on POD#7  N. Glee ArvinMichael Xu, MD [facility] [phone] 3:20 PM      Implant Name Type Inv. Item Serial No. Manufacturer Lot No. LRB No. Used  CUP SECTOR GRIPTON 50MM - [id number] Cup CUP SECTOR GRIPTON 50MM  DEPUY [id number] Right 1  LINER ACETABULAR 32X50 - [id number] Liner LINER ACETABULAR 32X50  DEPUY [id number] Right 1  SCREW 6.5MMX25MM - [id number] Screw SCREW 6.5MMX25MM  DEPUY [id number] Right 1  STEM CORAIL KA10 - [id number] Stem STEM CORAIL KA10  DEPUY [id number] Right 1  SROM M HEAD 32MM PLUS 1 - [id number] Hips SROM M HEAD 32MM PLUS 1   DEPUY [id number] Right 1

## 2015-12-16 NOTE — Anesthesia Postprocedure Evaluation (Signed)
Anesthesia Post Note  Patient: Cheryl DarbyDonna E [name]  Procedure(s) Performed: Procedure(s) (LRB): RIGHT TOTAL HIP ARTHROPLASTY ANTERIOR APPROACH (Right)  Patient location during evaluation: PACU Anesthesia Type: Spinal and MAC Level of consciousness: awake and alert Pain management: pain level controlled Vital Signs Assessment: post-procedure vital signs reviewed and stable Respiratory status: spontaneous breathing and respiratory function stable Cardiovascular status: blood pressure returned to baseline and stable Postop Assessment: spinal receding Anesthetic complications: no    Last Vitals:  Filed Vitals:   12/16/15 1645 12/16/15 1700  BP: 110/52 111/46  Pulse: 65 46  Temp:    Resp: 16 25    Last Pain:  Filed Vitals:   12/16/15 1704  PainSc: 7                  Kennieth RadFitzgerald, Jenel Gierke E

## 2015-12-16 NOTE — Transfer of Care (Signed)
Immediate Anesthesia Transfer of Care Note  Patient: Cheryl DarbyDonna E [name]  Procedure(s) Performed: Procedure(s): RIGHT TOTAL HIP ARTHROPLASTY ANTERIOR APPROACH (Right)  Patient Location: PACU  Anesthesia Type:MAC and Spinal  Level of Consciousness: awake, alert , oriented and patient cooperative  Airway & Oxygen Therapy: Patient Spontanous Breathing and Patient connected to nasal cannula oxygen  Post-op Assessment: Report given to RN and Post -op Vital signs reviewed and stable  Post vital signs: Reviewed and stable  Last Vitals:  Filed Vitals:   12/16/15 1122  BP: 162/55  Pulse: 88  Temp: 36.7 C  Resp: 16    Last Pain:  Filed Vitals:   12/16/15 1133  PainSc: 2          Complications: No apparent anesthesia complications

## 2015-12-17 ENCOUNTER — Encounter (HOSPITAL_COMMUNITY): Payer: Self-pay | Admitting: General Practice

## 2015-12-17 LAB — BASIC METABOLIC PANEL
Anion gap: 6 (ref 5–15)
BUN: 11 mg/dL (ref 6–20)
CHLORIDE: 104 mmol/L (ref 101–111)
CO2: 26 mmol/L (ref 22–32)
Calcium: 8.7 mg/dL — ABNORMAL LOW (ref 8.9–10.3)
Creatinine, Ser: 0.58 mg/dL (ref 0.44–1.00)
GFR calc Af Amer: >60 mL/min (ref >60)
GFR calc non Af Amer: >60 mL/min (ref >60)
GLUCOSE: 210 mg/dL — AB (ref 65–99)
POTASSIUM: 4.1 mmol/L (ref 3.5–5.1)
Sodium: 136 mmol/L (ref 135–145)

## 2015-12-17 LAB — CBC
HEMATOCRIT: 38.4 % (ref 36.0–46.0)
HEMOGLOBIN: 12.3 g/dL (ref 12.0–15.0)
MCH: 29.1 pg (ref 26.0–34.0)
MCHC: 32 g/dL (ref 30.0–36.0)
MCV: 90.8 fL (ref 78.0–100.0)
Platelets: 281 10*3/uL (ref 150–400)
RBC: 4.23 MIL/uL (ref 3.87–5.11)
RDW: 14.3 % (ref 11.5–15.5)
WBC: 11.9 10*3/uL — ABNORMAL HIGH (ref 4.0–10.5)

## 2015-12-17 NOTE — Evaluation (Signed)
Occupational Therapy Evaluation Patient Details Name: Cheryl [name] MRN: [id number] DOB: 05/01/1946 Today's Date: 12/17/2015    History of Present Illness Pt is a 70 y.o. female now s/p Rt direct anterior THA. PMH: osteoporosis, hypertension, asthma.    Clinical Impression   Pt was independent in self care prior to admission. Presents with mild R hip pain and impaired balance interfering with ability to function at her baseline. Will follow acutely.    Follow Up Recommendations  No OT follow up;Supervision/Assistance - 24 hour    Equipment Recommendations  3 in 1 bedside comode    Recommendations for Other Services       Precautions / Restrictions Precautions Precautions: Fall Precaution Comments: no hip precautions Restrictions Weight Bearing Restrictions: Yes RLE Weight Bearing: Weight bearing as tolerated      Mobility Bed Mobility Overal bed mobility: Needs Assistance Bed Mobility: Supine to Sit;Sit to Supine     Supine to sit: Supervision;HOB elevated Sit to supine: Supervision;HOB elevated   General bed mobility comments: no physical assist  Transfers Overall transfer level: Needs assistance Equipment used: Rolling walker (2 wheeled) Transfers: Sit to/from Stand Sit to Stand: Supervision         General transfer comment: cues for hand placement from bed and 3 in 1    Balance Overall balance assessment: Needs assistance Sitting-balance support: No upper extremity supported Sitting balance-Leahy Scale: Good     Standing balance support: Bilateral upper extremity supported Standing balance-Leahy Scale: Fair Standing balance comment: using rw                            ADL Overall ADL's : Needs assistance/impaired Eating/Feeding: Independent;Sitting   Grooming: Wash/dry hands;Standing;Supervision/safety   Upper Body Bathing: Set up;Sitting   Lower Body Bathing: Minimal assistance;Sit to/from stand   Upper Body Dressing : Set  up;Sitting   Lower Body Dressing: Minimal assistance;Sit to/from stand   Toilet Transfer: Supervision/safety;Ambulation;RW;BSC   Toileting- ArchitectClothing Manipulation and Hygiene: Supervision/safety;Sit to/from stand       Functional mobility during ADLs: Supervision/safety;Rolling walker       Vision     Perception     Praxis      Pertinent Vitals/Pain Pain Assessment: Faces Faces Pain Scale: Hurts a little bit Pain Location: R hip Pain Descriptors / Indicators: Sore Pain Intervention(s): Monitored during session;Premedicated before session     Hand Dominance Right   Extremity/Trunk Assessment Upper Extremity Assessment Upper Extremity Assessment: Overall WFL for tasks assessed   Lower Extremity Assessment Lower Extremity Assessment: Defer to PT evaluation   Cervical / Trunk Assessment Cervical / Trunk Assessment: Kyphotic   Communication Communication Communication: HOH (B hearing aids)   Cognition Arousal/Alertness: Awake/alert Behavior During Therapy: WFL for tasks assessed/performed Overall Cognitive Status: Within Functional Limits for tasks assessed                     General Comments       Exercises      Shoulder Instructions      Home Living Family/patient expects to be discharged to:: Private residence Living Arrangements: Spouse/significant other Available Help at Discharge: Family;Available 24 hours/day (initially) Type of Home: House Home Access: Stairs to enter Entergy CorporationEntrance Stairs-Number of Steps: 3 Entrance Stairs-Rails: None Home Layout: Two level;Able to live on main level with bedroom/bathroom     Bathroom Shower/Tub: Chief Strategy OfficerTub/shower unit   Bathroom Toilet: Standard     Home Equipment: Environmental consultantWalker - 2 wheels;Cane -  single point   Additional Comments: husband present and endorsing that he can be home with pt      Prior Functioning/Environment Level of Independence: Independent             OT Diagnosis: Generalized  weakness;Acute pain   OT Problem List: Decreased strength;Decreased activity tolerance;Impaired balance (sitting and/or standing);Decreased knowledge of use of DME or AE;Pain   OT Treatment/Interventions: Self-care/ADL training;DME and/or AE instruction;Patient/family education;Balance training    OT Goals(Current goals can be found in the care plan section) Acute Rehab OT Goals Patient Stated Goal: get back home OT Goal Formulation: With patient Time For Goal Achievement: 12/24/15 Potential to Achieve Goals: Good ADL Goals Pt Will Perform Grooming: with modified independence;standing Pt Will Perform Lower Body Bathing: with modified independence;sit to/from stand Pt Will Perform Lower Body Dressing: with modified independence;sit to/from stand Pt Will Transfer to Toilet: with modified independence;ambulating;bedside commode (over toilet) Pt Will Perform Toileting - Clothing Manipulation and hygiene: with modified independence;sit to/from stand Pt Will Perform Tub/Shower Transfer: Tub transfer;with min guard assist;ambulating;shower seat;rolling walker  OT Frequency: Min 2X/week   Barriers to D/C:            Co-evaluation              End of Session Equipment Utilized During Treatment: Gait belt;Rolling walker Nurse Communication:  (CM, SNF is not an option)  Activity Tolerance: Patient tolerated treatment well Patient left: in bed;with call bell/phone within reach;with nursing/sitter in room   Time: 1518-1550 OT Time Calculation (min): 32 min Charges:  OT General Charges $OT Visit: 1 Procedure OT Evaluation $OT Eval Low Complexity: 1 Procedure OT Treatments $Self Care/Home Management : 8-22 mins G-Codes:    Evern BioMayberry, Carlicia Leavens Lynn 12/17/2015, 4:13 PM

## 2015-12-17 NOTE — Progress Notes (Signed)
   Subjective:  Patient reports pain as marked.  No events.  Objective:   VITALS:   Filed Vitals:   12/16/15 1815 12/16/15 2028 12/17/15 0002 12/17/15 0425  BP: 121/61 140/65 141/45 136/58  Pulse:  50 73 71  Temp:  97.8 F (36.6 C) 97.7 F (36.5 C) 97.8 F (36.6 C)  TempSrc:  Oral Oral Oral  Resp:  16 16 16   Height:      Weight:      SpO2:  98% 97% 97%    Neurologically intact Neurovascular intact Sensation intact distally Intact pulses distally Dorsiflexion/Plantar flexion intact Incision: dressing C/D/I and no drainage No cellulitis present Compartment soft   Lab Results  Component Value Date   WBC 11.9* 12/17/2015   HGB 12.3 12/17/2015   HCT 38.4 12/17/2015   MCV 90.8 12/17/2015   PLT 281 12/17/2015     Assessment/Plan:  1 Day Post-Op   - Expected postop acute blood loss anemia - will monitor for symptoms - Up with PT/OT - DVT ppx - SCDs, ambulation, xarelto - WBAT operative extremity - Pain control - Discharge planning - patient's husband is considering a short term stay at a SNF for the patient, appreciate social work assistance  Cheryl [name], Cheryl [name] 12/17/2015, 7:56 AM [phone]

## 2015-12-17 NOTE — Progress Notes (Signed)
Physical Therapy Treatment Patient Details Name: Cheryl DarbyDonna E [name] MRN: [id number] DOB: 04/21/1946 Today's Date: 12/17/2015    History of Present Illness Pt is a 70 y.o. female now s/p Rt direct anterior THA. PMH: osteoporosis, hypertension, asthma.     PT Comments    Pt making progress with PT, able to ambulate 50 ft with rw, min guard assist. Pt remains uncertain if she will have any family available to help her when she goes home. States that she is going to talk with her husband. PT to continue to follow and progress mobility as tolerated.   Follow Up Recommendations  Home health PT;Supervision for mobility/OOB     Equipment Recommendations  None recommended by PT    Recommendations for Other Services       Precautions / Restrictions Precautions Precautions: Fall Precaution Comments: no hip precautions Restrictions Weight Bearing Restrictions: Yes RLE Weight Bearing: Weight bearing as tolerated    Mobility  Bed Mobility Overal bed mobility: Needs Assistance Bed Mobility: Sit to Supine       Sit to supine: Min assist   General bed mobility comments: min assist with Rt LE  Transfers Overall transfer level: Needs assistance Equipment used: Rolling walker (2 wheeled) Transfers: Sit to/from Stand Sit to Stand: Min guard         General transfer comment: cues provided for hand placement with sitting.   Ambulation/Gait Ambulation/Gait assistance: Min guard Ambulation Distance (Feet): 50 Feet Assistive device: Rolling walker (2 wheeled) Gait Pattern/deviations: Step-through pattern;Decreased weight shift to right;Decreased stance time - right Gait velocity: decreased   General Gait Details: slow pattern, cues for posture provided as needed.    Stairs            Wheelchair Mobility    Modified Rankin (Stroke Patients Only)       Balance Overall balance assessment: Needs assistance Sitting-balance support: No upper extremity supported Sitting  balance-Leahy Scale: Good     Standing balance support: Bilateral upper extremity supported Standing balance-Leahy Scale: Poor Standing balance comment: using rw                    Cognition Arousal/Alertness: Awake/alert Behavior During Therapy: WFL for tasks assessed/performed Overall Cognitive Status: Within Functional Limits for tasks assessed                      Exercises Total Joint Exercises Ankle Circles/Pumps: AROM;Both;10 reps Quad Sets: Strengthening;Right;10 reps Short Arc Quad: Strengthening;Right;10 reps (min assist) Heel Slides: AAROM;Right;10 reps Hip ABduction/ADduction: Strengthening;Right;10 reps (min assist)    General Comments        Pertinent Vitals/Pain Pain Assessment: Faces Faces Pain Scale: Hurts even more Pain Location: Rt hip Pain Descriptors / Indicators: Grimacing Pain Intervention(s): Limited activity within patient's tolerance;Monitored during session    Home Living                      Prior Function            PT Goals (current goals can now be found in the care plan section) Acute Rehab PT Goals Patient Stated Goal: get back home PT Goal Formulation: With patient Time For Goal Achievement: 12/31/15 Potential to Achieve Goals: Good Progress towards PT goals: Progressing toward goals    Frequency  7X/week    PT Plan Current plan remains appropriate    Co-evaluation             End of Session Equipment Utilized  During Treatment: Gait belt Activity Tolerance: Patient tolerated treatment well Patient left: in bed;with call bell/phone within reach;with nursing/sitter in room;with SCD's reapplied     Time: [id number] PT Time Calculation (min) (ACUTE ONLY): 27 min  Charges:  $Gait Training: 8-22 mins $Therapeutic Exercise: 8-22 mins                    G Codes:      Christiane HaBenjamin J. Iviana Blasingame, PT, CSCS Pager [phone] Office [phone]  12/17/2015, 2:26 PM

## 2015-12-17 NOTE — Evaluation (Signed)
Physical Therapy Evaluation Patient Details Name: Cheryl DarbyDonna E Rikard MRN: [id number] DOB: 04/05/1946 Today's Date: 12/17/2015   History of Present Illness  Pt is a 70 y.o. female now s/p Rt direct anterior THA. PMH: osteoporosis, hypertension, asthma.   Clinical Impression  Pt is s/p direct anterior THA resulting in the deficits listed below (see PT Problem List). Pt able to ambulate 15 feet with rw and min guard assist. Pt reports being uncertain of D/C plans. Describes that her spouse works 3-4 days per week for 12 hours and she is unsure if he will be working this weekend. She states that her daughter may be able to come stay with her over the weekend. Overall, the pt will benefit from skilled PT to increase their independence and safety with mobility to allow discharge to the venue listed below.       Follow Up Recommendations Home health PT;Supervision for mobility/OOB    Equipment Recommendations  None recommended by PT    Recommendations for Other Services       Precautions / Restrictions Precautions Precautions: Fall Precaution Comments: no hip precautions Restrictions Weight Bearing Restrictions: Yes RLE Weight Bearing: Weight bearing as tolerated      Mobility  Bed Mobility Overal bed mobility: Needs Assistance Bed Mobility: Supine to Sit     Supine to sit: Min assist;HOB elevated     General bed mobility comments: min assist with Rt LE, HOB approx 30 degrees, using rail to assist  Transfers Overall transfer level: Needs assistance Equipment used: Rolling walker (2 wheeled) Transfers: Sit to/from Stand Sit to Stand: Min guard         General transfer comment: cues for hand placement, emphasis with sitting. Transfer performed from bed and BSC  Ambulation/Gait Ambulation/Gait assistance: Min guard Ambulation Distance (Feet): 15 Feet (plus 10) Assistive device: Rolling walker (2 wheeled) Gait Pattern/deviations: Step-to pattern;Decreased stance time -  right;Decreased weight shift to right Gait velocity: decreased   General Gait Details: slow pattern but no gross loss of balance. Pt ambulating 15 feet followed by seated rest and then 10 more ft.   Stairs            Wheelchair Mobility    Modified Rankin (Stroke Patients Only)       Balance Overall balance assessment: Needs assistance Sitting-balance support: No upper extremity supported Sitting balance-Leahy Scale: Good     Standing balance support: Bilateral upper extremity supported Standing balance-Leahy Scale: Poor Standing balance comment: using rw for support                             Pertinent Vitals/Pain Pain Assessment: 0-10 Pain Score: 2  Pain Location: Rt hip Pain Descriptors / Indicators: Aching Pain Intervention(s): Limited activity within patient's tolerance;Monitored during session    Home Living Family/patient expects to be discharged to:: Unsure Living Arrangements: Spouse/significant other Available Help at Discharge: Family;Available PRN/intermittently (spouse works 12 hours 3-4 days per week) Type of Home: House Home Access: Stairs to enter Entrance Stairs-Rails: None Entrance Stairs-Number of Steps: 3 Home Layout: Two level;Able to live on main level with bedroom/bathroom Home Equipment: Dan HumphreysWalker - 2 wheels;Cane - single point Additional Comments: pt states that she will be alone if she goes home. Daughter might be available over the weekend.     Prior Function Level of Independence: Independent               Hand Dominance  Extremity/Trunk Assessment   Upper Extremity Assessment: Overall WFL for tasks assessed           Lower Extremity Assessment: RLE deficits/detail RLE Deficits / Details: needing assist to move Rt LE for bed mobility    Cervical / Trunk Assessment: Kyphotic  Communication   Communication: HOH (partial deafness-wears hearing aids)  Cognition Arousal/Alertness:  Awake/alert Behavior During Therapy: WFL for tasks assessed/performed Overall Cognitive Status: Within Functional Limits for tasks assessed                      General Comments      Exercises        Assessment/Plan    PT Assessment Patient needs continued PT services  PT Diagnosis Difficulty walking   PT Problem List Decreased strength;Decreased range of motion;Decreased activity tolerance;Decreased balance;Decreased mobility;Decreased knowledge of precautions  PT Treatment Interventions DME instruction;Gait training;Stair training;Functional mobility training;Therapeutic activities;Therapeutic exercise;Patient/family education   PT Goals (Current goals can be found in the Care Plan section) Acute Rehab PT Goals Patient Stated Goal: get back home PT Goal Formulation: With patient Time For Goal Achievement: 12/31/15 Potential to Achieve Goals: Good    Frequency 7X/week   Barriers to discharge Decreased caregiver support unclear available assistance at home    Co-evaluation               End of Session Equipment Utilized During Treatment: Gait belt Activity Tolerance: Patient tolerated treatment well Patient left: in chair;with call bell/phone within reach Nurse Communication: Mobility status;Weight bearing status         Time: [id number] PT Time Calculation (min) (ACUTE ONLY): 30 min   Charges:   PT Evaluation $PT Eval Moderate Complexity: 1 Procedure PT Treatments $Gait Training: 8-22 mins   PT G Codes:        Christiane HaBenjamin J. Trinisha Paget, PT, CSCS Pager [phone] Office [phone]  12/17/2015, 9:54 AM

## 2015-12-17 NOTE — Care Management Note (Signed)
Case Management Note  Patient Details  Name: Cheryl [name] MRN: [id number] Date of Birth: 02/18/1946  Subjective/Objective: 70 yr old female s/p right total hip arthroplasty.         Action/Plan:  Case manager spoke with patient concerning home health and DME needs. Choice for Home Health agency was offered. Referral was called to Tiffany Michaelle Copaslayton Baird, [facility]. Patient states she has rolling walker and 3in1. Will have family support at discharge.   Expected Discharge Date:   12/18/15               Expected Discharge Plan:  Home w Home Health Services  In-House Referral:     Discharge planning Services  CM Consult  Post Acute Care Choice:  Home Health Choice offered to:  Patient  DME Arranged:  N/A DME Agency:  NA  HH Arranged:  PT HH Agency:  [facility]  Status of Service:  Completed, signed off  If discussed at Long Length of Stay Meetings, dates discussed:    Additional Comments:  Durenda GuthrieBrady, Jaquavian Firkus Naomi, RN 12/17/2015, 12:09 PM

## 2015-12-18 LAB — CBC
HEMATOCRIT: 36.1 % (ref 36.0–46.0)
HEMOGLOBIN: 11.5 g/dL — AB (ref 12.0–15.0)
MCH: 29 pg (ref 26.0–34.0)
MCHC: 31.9 g/dL (ref 30.0–36.0)
MCV: 90.9 fL (ref 78.0–100.0)
Platelets: 284 10*3/uL (ref 150–400)
RBC: 3.97 MIL/uL (ref 3.87–5.11)
RDW: 14.4 % (ref 11.5–15.5)
WBC: 19.6 10*3/uL — AB (ref 4.0–10.5)

## 2015-12-18 NOTE — Discharge Summary (Signed)
Physician Discharge Summary      Patient ID: Cheryl [name] MRN: [id number] DOB/AGE: 71/06/1945 71 y.o.  Admit date: 12/16/2015 Discharge date: 12/18/2015  Admission Diagnoses:  <principal problem not specified>  Discharge Diagnoses:  Active Problems:   Hip joint replacement status   Past Medical History  Diagnosis Date  . Allergic rhinitis   . Asthma   . Emphysema   . Hypertension   . Abnormal heart rhythm   . Partial deafness   . Hyperlipidemia   . Shortness of breath   . Cardiomegaly   . Hypothyroidism     SEVERE   . Osteoporosis   . Heart murmur   . Mitral valve regurgitation   . Duodenal ulcer   . Family history of anesthesia complication     TWIN SISTER HAS DELAYED REACTION -with "caine" meds  . Pneumonia   . Arthritis     "right hip" (12/17/2015)    Surgeries: Procedure(s): RIGHT TOTAL HIP ARTHROPLASTY ANTERIOR APPROACH on 12/16/2015   Consultants (if any):    Discharged Condition: Improved  Hospital Course: Cheryl DarbyDonna E Bisaillon is an 70 y.o. female who was admitted 12/16/2015 with a diagnosis of <principal problem not specified> and went to the operating room on 12/16/2015 and underwent the above named procedures.    She was given perioperative antibiotics:  Anti-infectives    Start     Dose/Rate Route Frequency Ordered Stop   12/16/15 2300  vancomycin (VANCOCIN) IVPB 1000 mg/200 mL premix     1,000 mg 200 mL/hr over 60 Minutes Intravenous Every 12 hours 12/16/15 1811 12/17/15 1254   12/16/15 1330  vancomycin (VANCOCIN) IVPB 1000 mg/200 mL premix     1,000 mg 200 mL/hr over 60 Minutes Intravenous To Surgery 12/16/15 1319 12/16/15 1445   12/16/15 1116  ceFAZolin (ANCEF) 2-4 GM/100ML-% IVPB    Comments:  Girtha HakeKensmoe, Katherine  : cabinet override      12/16/15 1116 12/16/15 1340   12/15/15 0907  ceFAZolin (ANCEF) IVPB 2g/100 mL premix  Status:  Discontinued     2 g 200 mL/hr over 30 Minutes Intravenous On call to O.R. 12/15/15 [id number] 12/16/15 1811    .  She  was given sequential compression devices, early ambulation, and xarelto for DVT prophylaxis.  She benefited maximally from the hospital stay and there were no complications.    Recent vital signs:  Filed Vitals:   12/17/15 2013 12/18/15 0741  BP: 118/46 127/54  Pulse: 80 75  Temp: 98.5 F (36.9 C) 98.2 F (36.8 C)  Resp: 16 16    Recent laboratory studies:  Lab Results  Component Value Date   HGB 11.5* 12/18/2015   HGB 12.3 12/17/2015   HGB 14.1 12/09/2015   Lab Results  Component Value Date   WBC 19.6* 12/18/2015   PLT 284 12/18/2015   Lab Results  Component Value Date   INR 1.00 12/09/2015   Lab Results  Component Value Date   NA 136 12/17/2015   K 4.1 12/17/2015   CL 104 12/17/2015   CO2 26 12/17/2015   BUN 11 12/17/2015   CREATININE 0.58 12/17/2015   GLUCOSE 210* 12/17/2015    Discharge Medications:     Medication List    TAKE these medications        acetaminophen 500 MG tablet  Commonly known as:  TYLENOL  Take 1-2 tablets (500-1,000 mg total) by mouth every 6 (six) hours as needed for mild pain or moderate pain.     amLODipine-benazepril 5-20  MG capsule  Commonly known as:  LOTREL  Take 1 capsule by mouth every evening.     bimatoprost 0.03 % ophthalmic solution  Commonly known as:  LUMIGAN  Place 1 drop into both eyes at bedtime.     Fluticasone-Salmeterol 500-50 MCG/DOSE Aepb  Commonly known as:  ADVAIR DISKUS  Inhale 1 puff into the lungs 2 (two) times daily.     HYDROmorphone 4 MG tablet  Commonly known as:  DILAUDID  Take 1 tablet (4 mg total) by mouth every 3 (three) hours as needed for severe pain.     methocarbamol 750 MG tablet  Commonly known as:  ROBAXIN  Take 1 tablet (750 mg total) by mouth 2 (two) times daily as needed for muscle spasms.     ondansetron 4 MG tablet  Commonly known as:  ZOFRAN  Take 1-2 tablets (4-8 mg total) by mouth every 8 (eight) hours as needed for nausea or vomiting.     PROAIR HFA 108 (90 Base)  MCG/ACT inhaler  Generic drug:  albuterol  INHALE 2 PUFFS BY MOUTH( INTO THE LUNGS) EVERY 6 HOURS AS NEEDED FOR WHEEZING     rivaroxaban 10 MG Tabs tablet  Commonly known as:  XARELTO  Take 1 tablet (10 mg total) by mouth daily.     senna-docusate 8.6-50 MG tablet  Commonly known as:  SENOKOT S  Take 1 tablet by mouth at bedtime as needed.     SYNTHROID 75 MCG tablet  Generic drug:  levothyroxine  Take 1 tablet (75 mcg total) by mouth daily before breakfast.        Diagnostic Studies: Dg Pelvis Portable  12/16/2015  CLINICAL DATA:  Post right total hip replacement EXAM: PORTABLE PELVIS 1-2 VIEWS COMPARISON:  None. FINDINGS: Single frontal view of the pelvis submitted. There is right hip prosthesis with anatomic alignment. Postsurgical changes are noted with small periarticular soft tissue air. IMPRESSION: Right hip prosthesis with anatomic alignment. Electronically Signed   By: Natasha Mead M.D.   On: 12/16/2015 16:04   Dg Hip Operative Unilat With Pelvis Right  12/16/2015  CLINICAL DATA:  Right hip replacement EXAM: OPERATIVE right HIP (WITH PELVIS IF PERFORMED) 4 VIEWS TECHNIQUE: Fluoroscopic spot image(s) were submitted for interpretation post-operatively. COMPARISON:  None. FINDINGS: Four views of the right hip submitted. There is right hip prosthesis with anatomic alignment. Fluoroscopy time was 34 seconds.  Please see the operative report. IMPRESSION: Right hip prosthesis with anatomic alignment. Fluoroscopy time 34 seconds. Electronically Signed   By: Natasha Mead M.D.   On: 12/16/2015 15:15    Disposition: 01-Home or Self Care      Discharge Instructions    Call MD / Call 911    Complete by:  As directed   If you experience chest pain or shortness of breath, CALL 911 and be transported to the hospital emergency room.  If you develope a fever above 101.5 F, pus (white drainage) or increased drainage or redness at the wound, or calf pain, call your surgeon's office.      Constipation Prevention    Complete by:  As directed   Drink plenty of fluids.  Prune juice may be helpful.  You may use a stool softener, such as Colace (over the counter) 100 mg twice a day.  Use MiraLax (over the counter) for constipation as needed.     Diet - low sodium heart healthy    Complete by:  As directed      Diet general  Complete by:  As directed      Driving restrictions    Complete by:  As directed   No driving while taking narcotic pain meds.     Increase activity slowly as tolerated    Complete by:  As directed            Follow-up Information    Follow up with Cheral AlmasXu, Aden Youngman Michael, MD In 2 weeks.   Specialty:  Orthopedic Surgery   Why:  For suture removal, For wound re-check   Contact information:   [address] [place] [address] [phone]       Follow up with [facility].   Why:  Someone from [facility] will contact you to arrange start date and time for therapy.   Contact information:   [address] [place] [address] [phone]        Signed: Cheral AlmasXu, Darlin Stenseth Michael 12/18/2015, 10:18 AM

## 2015-12-18 NOTE — Progress Notes (Signed)
   Subjective:  Patient doing well.  Objective:   VITALS:   Filed Vitals:   12/17/15 1300 12/17/15 2013 12/18/15 0741 12/18/15 0820  BP: 155/54 118/46 127/54   Pulse: 71 80 75   Temp: 97.7 F (36.5 C) 98.5 F (36.9 C) 98.2 F (36.8 C)   TempSrc: Oral Oral Oral   Resp: 16 16 16    Height:      Weight:      SpO2: 93% 94% 96% 93%    Neurologically intact Neurovascular intact Sensation intact distally Intact pulses distally Dorsiflexion/Plantar flexion intact Incision: dressing C/D/I and no drainage No cellulitis present Compartment soft   Lab Results  Component Value Date   WBC 19.6* 12/18/2015   HGB 11.5* 12/18/2015   HCT 36.1 12/18/2015   MCV 90.9 12/18/2015   PLT 284 12/18/2015     Assessment/Plan:  2 Days Post-Op   - Expected postop acute blood loss anemia - will monitor for symptoms - cleared PT/OT - HHPT has been set up - ready for dc today  Cheral AlmasXu, [place] Mcphee Michael 12/18/2015, 10:19 AM [phone]

## 2015-12-18 NOTE — Progress Notes (Signed)
Physical Therapy Treatment Patient Details Name: Cheryl DarbyDonna E [name] MRN: [id number] DOB: 05/14/1946 Today's Date: 12/18/2015    History of Present Illness Pt is a 70 y.o. female now s/p Rt direct anterior THA. PMH: osteoporosis, hypertension, asthma.     PT Comments    Progressing well with mobility. Practiced/reviewed exercises, ambulation, and stair training. All education completed. Ready to d/c from PT standpoint.   Follow Up Recommendations  Home health PT;Supervision - Intermittent     Equipment Recommendations  None recommended by PT    Recommendations for Other Services       Precautions / Restrictions Precautions Precautions: Fall Restrictions Weight Bearing Restrictions: No RLE Weight Bearing: Weight bearing as tolerated    Mobility  Bed Mobility Overal bed mobility: Needs Assistance Bed Mobility: Supine to Sit     Supine to sit: Supervision;HOB elevated     General bed mobility comments: for safety.   Transfers Overall transfer level: Needs assistance Equipment used: Rolling walker (2 wheeled) Transfers: Sit to/from Stand Sit to Stand: Supervision         General transfer comment: for safety  Ambulation/Gait Ambulation/Gait assistance: Min guard Ambulation Distance (Feet): 85 Feet Assistive device: Rolling walker (2 wheeled) Gait Pattern/deviations: Step-through pattern;Decreased stride length     General Gait Details: close guard for safety. VCs safety, distance from walker.    Stairs Stairs: Yes Stairs assistance: Min assist Stair Management: Step to pattern;Forwards Number of Stairs: 2 General stair comments: VCs safety, technique, sequence. 1 HHA given. Practiced x1-pt denied need to practice a 2nd time  Wheelchair Mobility    Modified Rankin (Stroke Patients Only)       Balance                                    Cognition Arousal/Alertness: Awake/alert Behavior During Therapy: WFL for tasks  assessed/performed Overall Cognitive Status: Within Functional Limits for tasks assessed                      Exercises Total Joint Exercises Ankle Circles/Pumps: AROM;Both;10 reps Quad Sets: Strengthening;Right;10 reps Heel Slides: AAROM;Right;10 reps;Supine Hip ABduction/ADduction: AAROM;Right;10 reps;Supine    General Comments        Pertinent Vitals/Pain Pain Assessment: Faces Faces Pain Scale: Hurts little more Pain Location: R thigh Pain Descriptors / Indicators: Sore Pain Intervention(s): Monitored during session;Repositioned    Home Living                      Prior Function            PT Goals (current goals can now be found in the care plan section) Progress towards PT goals: Progressing toward goals    Frequency  7X/week    PT Plan Current plan remains appropriate    Co-evaluation             End of Session Equipment Utilized During Treatment: Gait belt Activity Tolerance: Patient tolerated treatment well Patient left: in chair;with call bell/phone within reach     Time: 0903-0929 PT Time Calculation (min) (ACUTE ONLY): 26 min  Charges:  $Gait Training: 8-22 mins $Therapeutic Exercise: 8-22 mins                    G Codes:      Rebeca AlertJannie Jayston Trevino, MPT Pager: [phone]

## 2015-12-18 NOTE — Progress Notes (Signed)
Occupational Therapy Treatment Patient Details Name: Cheryl [name] MRN: [id number] DOB: 06/08/1946 Today's Date: 12/18/2015    History of present illness Pt is a 70 y.o. female now s/p Rt direct anterior THA. PMH: osteoporosis, hypertension, asthma.    OT comments  Pt performed sponge bathing at sink with supervision and verbal cues for compensatory strategies for LB dressing. Educated pt and husband in use of 3 in 1 in tub for seated showering and technique. Husband will assist with tub transfers. Pt eager to go home.  Follow Up Recommendations  No OT follow up;Supervision/Assistance - 24 hour    Equipment Recommendations  None recommended by OT (husband states they have a 3 in 1)    Recommendations for Other Services      Precautions / Restrictions Precautions Precautions: Fall Precaution Comments: no hip precautions Restrictions Weight Bearing Restrictions: No RLE Weight Bearing: Weight bearing as tolerated       Mobility Bed Mobility      General bed mobility comments: in chair  Transfers Overall transfer level: Needs assistance Equipment used: Rolling walker (2 wheeled) Transfers: Sit to/from Stand Sit to Stand: Supervision         General transfer comment: for safety, good hand placement    Balance     Sitting balance-Leahy Scale: Good       Standing balance-Leahy Scale: Fair                     ADL Overall ADL's : Needs assistance/impaired     Grooming: Wash/dry hands;Standing;Brushing hair;Wash/dry face;Sitting   Upper Body Bathing: Set up;Sitting   Lower Body Bathing: Supervison/ safety;Sit to/from stand   Upper Body Dressing : Set up;Sitting   Lower Body Dressing: Supervision/safety;Sit to/from stand Lower Body Dressing Details (indicate cue type and reason): instructed to dress R LE first Toilet Transfer: Supervision/safety;Ambulation;RW;BSC   Toileting- ArchitectClothing Manipulation and Hygiene: Supervision/safety;Sit to/from stand    Tub/ Engineer, structuralhower Transfer: Min guard;3 in Scientist, water quality1;Rolling walker Tub/Shower Transfer Details (indicate cue type and reason): instructed husband in setting up 3 in 1 in tub Functional mobility during ADLs: Supervision/safety;Rolling walker General ADL Comments: Pt performed ADL and dressing seated on 3 in 1 in front of sink.      Vision                     Perception     Praxis      Cognition   Behavior During Therapy: WFL for tasks assessed/performed Overall Cognitive Status: Within Functional Limits for tasks assessed                       Extremity/Trunk Assessment               Exercises    Shoulder Instructions       General Comments      Pertinent Vitals/ Pain       Pain Assessment: Faces Faces Pain Scale: Hurts little more Pain Location: R hip with movement Pain Descriptors / Indicators: Sore;Guarding Pain Intervention(s): Monitored during session;Premedicated before session;Repositioned  Home Living                                          Prior Functioning/Environment              Frequency       Progress Toward Goals  OT Goals(current goals can now be found in the care plan section)  Progress towards OT goals: Progressing toward goals  Acute Rehab OT Goals Patient Stated Goal: get back home  Plan Discharge plan remains appropriate    Co-evaluation                 End of Session Equipment Utilized During Treatment: Gait belt;Rolling walker   Activity Tolerance Patient tolerated treatment well   Patient Left in chair;with call bell/phone within reach   Nurse Communication  (ready to d/c)        Time: [id number] OT Time Calculation (min): 37 min  Charges: OT General Charges $OT Visit: 1 Procedure OT Treatments $Self Care/Home Management : 23-37 mins  Evern BioMayberry, Blythe Veach Lynn 12/18/2015, 11:26 AM  [phone]

## 2015-12-18 NOTE — Progress Notes (Signed)
Reviewed Discharge Papers and medications with full understanding

## 2015-12-18 NOTE — Progress Notes (Signed)
Called case mgmt pt has all equipment at home notified heather [phone]

## 2016-01-15 ENCOUNTER — Other Ambulatory Visit: Payer: Self-pay | Admitting: Physician Assistant

## 2016-01-16 ENCOUNTER — Ambulatory Visit (INDEPENDENT_AMBULATORY_CARE_PROVIDER_SITE_OTHER): Payer: BLUE CROSS/BLUE SHIELD | Admitting: Family Medicine

## 2016-01-16 VITALS — BP 130/66 | HR 75 | Temp 98.4°F | Resp 16 | Ht 60.0 in | Wt 108.0 lb

## 2016-01-16 DIAGNOSIS — I10 Essential (primary) hypertension: Secondary | ICD-10-CM

## 2016-01-16 DIAGNOSIS — Z5181 Encounter for therapeutic drug level monitoring: Secondary | ICD-10-CM

## 2016-01-16 DIAGNOSIS — R0600 Dyspnea, unspecified: Secondary | ICD-10-CM

## 2016-01-16 DIAGNOSIS — E038 Other specified hypothyroidism: Secondary | ICD-10-CM

## 2016-01-16 DIAGNOSIS — E039 Hypothyroidism, unspecified: Secondary | ICD-10-CM | POA: Diagnosis not present

## 2016-01-16 DIAGNOSIS — B370 Candidal stomatitis: Secondary | ICD-10-CM | POA: Diagnosis not present

## 2016-01-16 DIAGNOSIS — I499 Cardiac arrhythmia, unspecified: Secondary | ICD-10-CM

## 2016-01-16 DIAGNOSIS — R7309 Other abnormal glucose: Secondary | ICD-10-CM | POA: Diagnosis not present

## 2016-01-16 DIAGNOSIS — E559 Vitamin D deficiency, unspecified: Secondary | ICD-10-CM

## 2016-01-16 DIAGNOSIS — J4530 Mild persistent asthma, uncomplicated: Secondary | ICD-10-CM | POA: Diagnosis not present

## 2016-01-16 DIAGNOSIS — R7303 Prediabetes: Secondary | ICD-10-CM

## 2016-01-16 DIAGNOSIS — K120 Recurrent oral aphthae: Secondary | ICD-10-CM

## 2016-01-16 LAB — VITAMIN B12: VITAMIN B 12: 526 pg/mL (ref 200–1100)

## 2016-01-16 LAB — COMPREHENSIVE METABOLIC PANEL
ALBUMIN: 4 g/dL (ref 3.6–5.1)
ALT: 18 U/L (ref 6–29)
AST: 25 U/L (ref 10–35)
Alkaline Phosphatase: 131 U/L — ABNORMAL HIGH (ref 33–130)
BILIRUBIN TOTAL: 0.3 mg/dL (ref 0.2–1.2)
BUN: 13 mg/dL (ref 7–25)
CALCIUM: 9.6 mg/dL (ref 8.6–10.4)
CO2: 27 mmol/L (ref 20–31)
Chloride: 106 mmol/L (ref 98–110)
Creat: 0.75 mg/dL (ref 0.60–0.93)
Glucose, Bld: 109 mg/dL — ABNORMAL HIGH (ref 65–99)
Potassium: 4.4 mmol/L (ref 3.5–5.3)
Sodium: 140 mmol/L (ref 135–146)
TOTAL PROTEIN: 7.3 g/dL (ref 6.1–8.1)

## 2016-01-16 LAB — THYROID PANEL WITH TSH
Free Thyroxine Index: 2.5 (ref 1.4–3.8)
T3 UPTAKE: 28 % (ref 22–35)
T4 TOTAL: 9 ug/dL (ref 4.5–12.0)
TSH: 7.97 mIU/L — ABNORMAL HIGH

## 2016-01-16 LAB — POCT GLYCOSYLATED HEMOGLOBIN (HGB A1C): HEMOGLOBIN A1C: 5.7

## 2016-01-16 MED ORDER — FLUTICASONE-SALMETEROL 500-50 MCG/DOSE IN AEPB: 1.0000 | INHALATION_SPRAY | Freq: Two times a day (BID) | RESPIRATORY_TRACT | 5 refills | Status: AC

## 2016-01-16 MED ORDER — ALBUTEROL SULFATE HFA 108 (90 BASE) MCG/ACT IN AERS: 2.0000 | INHALATION_SPRAY | RESPIRATORY_TRACT | 5 refills | Status: DC | PRN

## 2016-01-16 MED ORDER — AMLODIPINE BESY-BENAZEPRIL HCL 5-20 MG PO CAPS: 1.0000 | ORAL_CAPSULE | Freq: Every evening | ORAL | 1 refills | Status: AC

## 2016-01-16 MED ORDER — NYSTATIN 100000 UNIT/ML MT SUSP: 5.0000 mL | Freq: Four times a day (QID) | OROMUCOSAL | 0 refills | Status: DC

## 2016-01-16 NOTE — Patient Instructions (Addendum)
I think you should be able to come off of the xarelto.    IF you received an x-ray today, you will receive an invoice from [facility]. Please contact [facility] at [phone] with questions or concerns regarding your invoice.   IF you received labwork today, you will receive an invoice from [employer]. Please contact [facility] at [phone] with questions or concerns regarding your invoice.   Our billing staff will not be able to assist you with questions regarding bills from these companies.  You will be contacted with the lab results as soon as they are available. The fastest way to get your results is to activate your My Chart account. Instructions are located on the last page of this paperwork. If you have not heard from [place] regarding the results in 2 weeks, please contact this office.      Canker Sores Canker sores are small, painful sores that develop inside your mouth. They may also be called aphthous ulcers. You can get canker sores on the inside of your lips or cheeks, on your tongue, or anywhere inside your mouth. You can have just one canker sore or several of them. Canker sores cannot be passed from one person to another (noncontagious). These sores are different than the sores that you may get on the outside of your lips (cold sores or fever blisters). Canker sores usually start as painful red bumps. Then they turn into small white, yellow, or gray ulcers that have red borders. The ulcers may be quite painful. The pain may be worse when you eat or drink. CAUSES The cause of this condition is not known. RISK FACTORS This condition is more likely to develop in:  Women.  People in their teens or 39s.  Women who are having their menstrual period.  People who are under a lot of emotional stress.  People who do not get enough iron or B vitamins.  People who have poor oral hygiene.  People who have an injury inside the mouth.  This can happen after having dental work or from chewing something hard. SYMPTOMS Along with the canker sore, symptoms may also include:  Fever.  Fatigue.  Swollen lymph nodes in your neck. DIAGNOSIS This condition can be diagnosed based on your symptoms. Your health care provider will also examine your mouth. Your health care provider may also do tests if you get canker sores often or if they are very bad. Tests may include:  Blood tests to rule out other causes of canker sores.  Taking swabs from the sore to check for infection.  Taking a small piece of skin from the sore (biopsy) to test it for cancer. TREATMENT Most canker sores clear up without treatment in about 10 days. Home care is usually the only treatment that you will need. Over-the-counter medicines can relieve discomfort.If you have severe canker sores, your health care provider may prescribe:  Numbing ointment to relieve pain.  Vitamins.  Steroid medicines. These may be given as:  Oral pills.  Mouth rinses.  Gels.  Antibiotic mouth rinse. HOME CARE INSTRUCTIONS  Apply, take, or use medicines only as directed by your health care provider. These include vitamins.  If you were prescribed an antibiotic mouth rinse, finish all of it even if you start to feel better.  Until the sores are healed:  Do not drink coffee or citrus juices.  Do not eat spicy or salty foods.  Use a mild, over-the-counter mouth rinse as directed by your  health care provider.  Practice good oral hygiene.  Floss your teeth every day.  Brush your teeth with a soft brush twice each day. SEEK MEDICAL CARE IF:  Your symptoms do not get better after two weeks.  You also have a fever or swollen glands.  You get canker sores often.  You have a canker sore that is getting larger.  You cannot eat or drink due to your canker sores.   This information is not intended to replace advice given to you by your health care provider. Make  sure you discuss any questions you have with your health care provider.   Document Released: 10/01/2010 Document Revised: 10/21/2014 Document Reviewed: 05/07/2014 Elsevier Interactive Patient Education [employer]

## 2016-01-18 ENCOUNTER — Encounter: Payer: Self-pay | Admitting: Family Medicine

## 2016-01-18 LAB — VITAMIN D 25 HYDROXY (VIT D DEFICIENCY, FRACTURES): VIT D 25 HYDROXY: 34 ng/mL (ref 30–100)

## 2016-01-18 MED ORDER — LEVOTHYROXINE SODIUM 88 MCG PO TABS: 88.0000 ug | ORAL_TABLET | Freq: Every day | ORAL | 1 refills | Status: AC

## 2016-01-18 NOTE — Progress Notes (Signed)
Subjective:    Patient ID: Cheryl [name], female    DOB: 27-Jun-1945, 70 y.o.   MRN: [id number] By signing my name below, I, Javier Docker, attest that this documentation has been prepared under the direction and in the presence of [name], MD. Electronically Signed: Javier Docker, ER Scribe. 01/16/2016. 3:58 PM.  Chief Complaint  Patient presents with  . Leg Pain    right leg, several weeks    HPI HPI Comments: Cheryl [name] is a 70 y.o. female with a recent hx of right hip replacement one month ago for a hospital follow up. After her surgery she developed a large blister at her right ankle which was drained and wrapped. That wound is almost completely healed.   She is also complaining of mouth pain for the last two weeks.   She has been frequently noted to keep an irregular rate with a bradycardia but keep a normal sinus rhythm on prior EKGs.    Past Medical History:  Diagnosis Date  . Abnormal heart rhythm   . Allergic rhinitis   . Arthritis    "right hip" (12/17/2015)  . Asthma   . Cardiomegaly   . Duodenal ulcer   . Emphysema   . Family history of anesthesia complication    TWIN SISTER HAS DELAYED REACTION -with "caine" meds  . Heart murmur   . Hyperlipidemia   . Hypertension   . Hypothyroidism    SEVERE   . Mitral valve regurgitation   . Osteoporosis   . Partial deafness   . Pneumonia   . Shortness of breath    Allergies  Allergen Reactions  . Aspirin     REACTION: asthma flare  . Codeine     REACTION: ulcer   Current Outpatient Prescriptions on File Prior to Visit  Medication Sig Dispense Refill  . acetaminophen (TYLENOL) 500 MG tablet Take 1-2 tablets (500-1,000 mg total) by mouth every 6 (six) hours as needed for mild pain or moderate pain. 60 tablet 0  . bimatoprost (LUMIGAN) 0.03 % ophthalmic solution Place 1 drop into both eyes at bedtime.     [name] HYDROmorphone (DILAUDID) 4 MG tablet Take 1 tablet (4 mg total) by mouth every 3 (three) hours as  needed for severe pain. 30 tablet 0  . rivaroxaban (XARELTO) 10 MG TABS tablet Take 1 tablet (10 mg total) by mouth daily. 28 tablet 0   No current facility-administered medications on file prior to visit.     Review of Systems  Constitutional: Positive for fatigue.  HENT: Positive for dental problem.   Musculoskeletal: Positive for arthralgias, gait problem and joint swelling.  Skin: Positive for color change, rash and wound.  Hematological: Bruises/bleeds easily.  Psychiatric/Behavioral: Positive for confusion and sleep disturbance.      Objective:  Physical Exam  Constitutional: She is oriented to person, place, and time. She appears well-developed and well-nourished. No distress.  HENT:  Head: Normocephalic and atraumatic.  3/94mm anthus ulcer with rolled white borders along the left corner of mouth. Oropharynx otherwise normal.   Eyes: Pupils are equal, round, and reactive to light.  Neck: Neck supple.  Cardiovascular: Normal rate.   Irregular rhythm with a 2/6 systolic ejection murmur RUSB.   Pulmonary/Chest: Effort normal. No respiratory distress.  Decreased air movement and some upper airway rhonchi.   Musculoskeletal: Normal range of motion.  Neurological: She is alert and oriented to person, place, and time. Coordination normal.  Skin: Skin is warm and  dry. She is not diaphoretic.  Moxin type of tinea pedis. Erythematous macular rash on the proximal to medial malleolus with scaling. Trace pedal edema to knee 1+ in ankle with pitting. 2+ dorsalis pedis pulses.   Psychiatric: She has a normal mood and affect. Her behavior is normal.  Nursing note and vitals reviewed.  BP 130/66   Pulse 75   Temp 98.4 F (36.9 C) (Oral)   Resp 16   Ht 5' (1.524 m)   Wt 108 lb (49 kg)   SpO2 93%   BMI 21.09 kg/m     [facility] reading (PRIMARY) by  Dr. Clelia Croft. EKG: NSR but variable baseline rate  Assessment & Plan:   Refill synthroid, brand name only, after labs returned. 1. Severe  hypothyroidism - uses synthroid brand name only, increase dose from 75 to 88 mcg as tsh slightly elevated at 7.5  2. Abnormal blood sugar   3. Essential hypertension   4. CANDIDIASIS, ORAL - aphthous ulcer and erythema with cracking - try nystatin switch and swallow with top lidocaine, likely due to advair  5. Irregular cardiac rhythm   6. Medication monitoring encounter   7. Avitaminosis D   8. Aphthous ulcer   9. Prediabetes   10. Dyspnea   11. Asthma, mild persistent, uncomplicated     Orders Placed This Encounter  Procedures  . Thyroid Panel With TSH  . Comprehensive metabolic panel  . Vitamin B12  . VITAMIN D 25 Hydroxy (Vit-D Deficiency, Fractures)  . POCT glycosylated hemoglobin (Hb A1C)  . EKG 12-Lead    Meds ordered this encounter  Medications  . albuterol (PROAIR HFA) 108 (90 Base) MCG/ACT inhaler    Sig: Inhale 2 puffs into the lungs every 4 (four) hours as needed for wheezing or shortness of breath.    Dispense:  18 g    Refill:  5    Office visit needed for refills  . Fluticasone-Salmeterol (ADVAIR DISKUS) 500-50 MCG/DOSE AEPB    Sig: Inhale 1 puff into the lungs 2 (two) times daily.    Dispense:  60 each    Refill:  5  . amLODipine-benazepril (LOTREL) 5-20 MG capsule    Sig: Take 1 capsule by mouth every evening.    Dispense:  90 capsule    Refill:  1  . nystatin (MYCOSTATIN) 100000 UNIT/ML suspension    Sig: Take 5 mLs (500,000 Units total) by mouth 4 (four) times daily. Swish and gargle in mouth for as long as possible before swallowing    Dispense:  180 mL    Refill:  0  . levothyroxine (SYNTHROID) 88 MCG tablet    Sig: Take 1 tablet (88 mcg total) by mouth daily before breakfast.    Dispense:  90 tablet    Refill:  1    BRAND NAME ONLY medically necessary    I personally performed the services described in this documentation, which was scribed in my presence. The recorded information has been reviewed and considered, and addended by me as needed.    [name], M.D.  [facility] [address] [place], [place] [address] [phone] phone [phone] fax  01/18/16 10:44 AM Results for orders placed or performed in visit on 01/16/16  Thyroid Panel With TSH  Result Value Ref Range   T4, Total 9.0 4.5 - 12.0 ug/dL   T3 Uptake 28 22 - 35 %   Free Thyroxine Index 2.5 1.4 - 3.8   TSH 7.97 (  H) mIU/L  Comprehensive metabolic panel  Result Value Ref Range   Sodium 140 135 - 146 mmol/L   Potassium 4.4 3.5 - 5.3 mmol/L   Chloride 106 98 - 110 mmol/L   CO2 27 20 - 31 mmol/L   Glucose, Bld 109 (H) 65 - 99 mg/dL   BUN 13 7 - 25 mg/dL   Creat 5.46 5.03 - 5.46 mg/dL   Total Bilirubin 0.3 0.2 - 1.2 mg/dL   Alkaline Phosphatase 131 (H) 33 - 130 U/L   AST 25 10 - 35 U/L   ALT 18 6 - 29 U/L   Total Protein 7.3 6.1 - 8.1 g/dL   Albumin 4.0 3.6 - 5.1 g/dL   Calcium 9.6 8.6 - 56.8 mg/dL  Vitamin L27  Result Value Ref Range   Vitamin B-12 526 200 - 1,100 pg/mL  VITAMIN D 25 Hydroxy (Vit-D Deficiency, Fractures)  Result Value Ref Range   Vit D, 25-Hydroxy 34 30 - 100 ng/mL  POCT glycosylated hemoglobin (Hb A1C)  Result Value Ref Range   Hemoglobin A1C 5.7

## 2016-01-21 ENCOUNTER — Ambulatory Visit: Payer: BLUE CROSS/BLUE SHIELD | Attending: Orthopaedic Surgery | Admitting: Physical Therapy

## 2016-01-21 ENCOUNTER — Encounter: Payer: Self-pay | Admitting: Physical Therapy

## 2016-01-21 DIAGNOSIS — R262 Difficulty in walking, not elsewhere classified: Secondary | ICD-10-CM | POA: Diagnosis present

## 2016-01-21 DIAGNOSIS — M25651 Stiffness of right hip, not elsewhere classified: Secondary | ICD-10-CM | POA: Insufficient documentation

## 2016-01-21 DIAGNOSIS — M25551 Pain in right hip: Secondary | ICD-10-CM | POA: Diagnosis not present

## 2016-01-21 NOTE — Therapy (Signed)
[facility] [place] [address] [place], [place], [address] Phone: [phone]   Fax:  [phone]  Physical Therapy Evaluation  Patient Details  Name: Cheryl [name] MRN: [id number] Date of Birth: 06-27-1945 Referring Provider: Roda Shutters  Encounter Date: 01/21/2016      PT End of Session - 01/21/16 1614    Visit Number 1   Date for PT Re-Evaluation 03/22/16   PT Start Time 1545   PT Stop Time 1615   PT Time Calculation (min) 30 min   Activity Tolerance Patient limited by pain   Behavior During Therapy [facility] for tasks assessed/performed      Past Medical History:  Diagnosis Date  . Abnormal heart rhythm   . Allergic rhinitis   . Arthritis    "right hip" (12/17/2015)  . Asthma   . Cardiomegaly   . Duodenal ulcer   . Emphysema   . Family history of anesthesia complication    TWIN SISTER HAS DELAYED REACTION -with "caine" meds  . Heart murmur   . Hyperlipidemia   . Hypertension   . Hypothyroidism    SEVERE   . Mitral valve regurgitation   . Osteoporosis   . Partial deafness   . Pneumonia   . Shortness of breath     Past Surgical History:  Procedure Laterality Date  . ABDOMINAL HYSTERECTOMY    . CATARACT EXTRACTION W/ INTRAOCULAR LENS  IMPLANT, BILATERAL Bilateral ~ 2015  . CHOLECYSTECTOMY OPEN  ?1980s  . CLEFT PALATE REPAIR  1949  . INNER EAR SURGERY Bilateral   . JOINT REPLACEMENT    . REPAIR OF PERFORATED ULCER  ?1980s   "duodenal"  . THYROIDECTOMY  2000s  . TONSILLECTOMY AND ADENOIDECTOMY  1950s  . TOTAL HIP ARTHROPLASTY Right 12/16/2015  . TOTAL HIP ARTHROPLASTY Right 12/16/2015   Procedure: RIGHT TOTAL HIP ARTHROPLASTY ANTERIOR APPROACH;  Surgeon: Tarry Kos, MD;  Location: [facility] OR;  Service: Orthopedics;  Laterality: Right;  . TUBAL LIGATION      There were no vitals filed for this visit.       Subjective Assessment - 01/21/16 1543    Subjective S/P right THR on 12/16/15, "wore out".  She reports that  she had complications with a "bad sore" on the right medial ankle.  This area is still very red and irritated looking.   Limitations Standing;Walking;House hold activities   Currently in Pain? Yes   Pain Score 3    Pain Location Hip   Pain Orientation Right   Pain Descriptors / Indicators Aching;Sore;Tightness   Pain Type Surgical pain   Pain Onset More than a month ago   Pain Frequency Constant   Aggravating Factors  getting up from sitting, standing walking longer periods pain will be up to 7/10   Pain Relieving Factors rest   Effect of Pain on Daily Activities difficulty walking and sleeping            [facility] PT Assessment - 01/21/16 0001      Assessment   Medical Diagnosis s/p right anterior THR   Referring Provider Xu   Onset Date/Surgical Date 12/16/15     Precautions   Precautions Anterior Hip     Balance Screen   Has the patient fallen in the past 6 months No   Has the patient had a decrease in activity level because of a fear of falling?  No   Is the patient reluctant to leave their home because of a  fear of falling?  No     Home Environment   Additional Comments 3 steps into the home, does her own housework, and some yardwork     Prior Function   Level of Independence Independent   Vocation Retired   Leisure no exercise     ROM / Strength   AROM / PROM / Strength AROM;Strength     AROM   AROM Assessment Site Hip   Right/Left Hip Right   Right Hip Extension 5   Right Hip Flexion 70   Right Hip External Rotation  5   Right Hip Internal Rotation  0   Right Hip ABduction 10     Strength   Overall Strength Comments 3+/5 for the right hip     Palpation   Palpation comment scar is well healed, she is tender to touch, the scar feels to have some knotted up tissue under it, she has a reddish/purple are on the medial right ankle with some eschar here      Ambulation/Gait   Gait Comments uses a cane, slow steps, stooped posture,     Standardized Balance  Assessment   Standardized Balance Assessment Timed Up and Go Test     Timed Up and Go Test   Normal TUG (seconds) 26                   [facility] Adult PT Treatment/Exercise - 01/21/16 0001      Exercises   Exercises Knee/Hip     Knee/Hip Exercises: Aerobic   Nustep level 2 x 5 minutes                PT Education - 01/21/16 1610    Education provided Yes   Education Details went over RICE, gentle scar massage and supine SLR, sidelying clams   Person(s) Educated Patient   Methods Explanation;Demonstration;Handout   Comprehension Verbalized understanding          PT Short Term Goals - 01/21/16 1702      PT SHORT TERM GOAL #1   Title independent with initial HEP   Time 2   Period Weeks   Status New           PT Long Term Goals - 01/21/16 1702      PT LONG TERM GOAL #1   Title increase strength of the right hip to 4/5   Time 8   Period Weeks   Status New     PT LONG TERM GOAL #2   Title walk in home without assistive device   Time 8   Period Weeks   Status New     PT LONG TERM GOAL #3   Title report pain decreased 50%   Time 8   Period Weeks   Status New     PT LONG TERM GOAL #4   Title increase hip flexion to 90 degrees    Time 8   Period Weeks   Status New               Plan - 01/21/16 1615    Clinical Impression Statement Patient underwent a right anterior approach THR on 12/16/15.  She does report complications of a blister and a wound on the right medial ankle area, this area is still red and sore.  She uses a SPC for walking, slow gait, stooped posture, antalgic on the right, she is weak inthe hip and the scar is sore and swollen with some decreased mobility., patient is  legally deat, wears hearing aids and was able to converse without difficulty   Rehab Potential Good   PT Frequency 2x / week   PT Duration 8 weeks   PT Treatment/Interventions ADLs/Self Care Home Management;Cryotherapy;Electrical Stimulation;Moist  Heat;Gait training;Stair training;Functional mobility training;Patient/family education;Balance training;Therapeutic exercise;Therapeutic activities;Manual techniques   PT Next Visit Plan Slowly add exercises as tolerated as well as gait training   Consulted and Agree with Plan of Care Patient      Patient will benefit from skilled therapeutic intervention in order to improve the following deficits and impairments:  Abnormal gait, Decreased activity tolerance, Decreased balance, Decreased mobility, Decreased range of motion, Decreased scar mobility, Decreased strength, Difficulty walking, Impaired flexibility, Improper body mechanics, Pain  Visit Diagnosis: Pain in right hip - Plan: PT plan of care cert/re-cert  Difficulty in walking, not elsewhere classified - Plan: PT plan of care cert/re-cert  Stiffness of right hip, not elsewhere classified - Plan: PT plan of care cert/re-cert      G-Codes - 02/03/2016 1705    Functional Assessment Tool Used foto 69% limitation   Functional Limitation Mobility: Walking and moving around   Mobility: Walking and Moving Around Current Status [phone]) At least 60 percent but less than 80 percent impaired, limited or restricted   Mobility: Walking and Moving Around Goal Status [phone]) At least 40 percent but less than 60 percent impaired, limited or restricted       Problem List Patient Active Problem List   Diagnosis Date Noted  . Abnormal heart rhythm 01/16/2016  . Hip joint replacement status 12/16/2015  . Prediabetes 04/02/2015  . Osteoporosis   . Mood disorder in conditions classified elsewhere 02/05/2013  . Severe hypothyroidism 01/24/2013  . 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 Hyperlipidemia 01/24/2013  . Abnormal LFTs 03/10/2011  . 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 BP (high blood pressure) 03/04/2011  . Adult hypothyroidism 03/04/2011  . Atrial enlargement, left 03/04/2011  . Left ventricular hypertrophy  03/04/2011  . Billowing mitral valve 03/04/2011  . Pulmonary hypertension (HCC) 03/04/2011  . Pain in right hip 03/04/2011  . Disease of supporting structures of teeth 10/28/2010  . OP (osteoporosis) 09/01/2010  . Abnormal blood sugar 09/01/2010  . HLD (hyperlipidemia) 09/01/2010  . Essential (primary) hypertension 09/01/2010  . Avitaminosis D 09/01/2010  . Bipolar I disorder, single manic episode (HCC) 04/21/2010  . Difficulty hearing 08/17/2009  . Disorder of mitral valve 05/22/2009  . CANDIDIASIS, ORAL 03/06/2009  . Essential hypertension 05/09/2008  . ALLERGIC RHINITIS 05/09/2008  . Asthma 05/09/2008    Jearld Lesch., PT 02/03/2016, 5:21 PM  [facility] [place] [facility] [address] [place], [place], [address] Phone: [phone]   Fax:  [phone]  Name: Cheryl [name] MRN: [id number] Date of Birth: August 17, 1945

## 2016-01-26 ENCOUNTER — Ambulatory Visit: Payer: BLUE CROSS/BLUE SHIELD | Admitting: Physical Therapy

## 2016-01-28 ENCOUNTER — Ambulatory Visit: Payer: BLUE CROSS/BLUE SHIELD | Admitting: Physical Therapy

## 2016-03-20 DIAGNOSIS — W19XXXA Unspecified fall, initial encounter: Secondary | ICD-10-CM

## 2016-03-20 HISTORY — DX: Unspecified fall, initial encounter: W19.XXXA

## 2016-04-09 ENCOUNTER — Ambulatory Visit: Payer: BLUE CROSS/BLUE SHIELD

## 2016-04-20 ENCOUNTER — Emergency Department (HOSPITAL_COMMUNITY): Payer: BLUE CROSS/BLUE SHIELD

## 2016-04-20 ENCOUNTER — Emergency Department (HOSPITAL_BASED_OUTPATIENT_CLINIC_OR_DEPARTMENT_OTHER)
Admit: 2016-04-20 | Discharge: 2016-04-20 | Disposition: A | Discharge status: Home / Self Care | Payer: BLUE CROSS/BLUE SHIELD | Attending: Physician Assistant | Admitting: Physician Assistant

## 2016-04-20 ENCOUNTER — Encounter (HOSPITAL_COMMUNITY): Payer: Self-pay | Admitting: Internal Medicine

## 2016-04-20 ENCOUNTER — Observation Stay (HOSPITAL_COMMUNITY): Payer: BLUE CROSS/BLUE SHIELD

## 2016-04-20 ENCOUNTER — Ambulatory Visit (INDEPENDENT_AMBULATORY_CARE_PROVIDER_SITE_OTHER): Payer: BLUE CROSS/BLUE SHIELD

## 2016-04-20 ENCOUNTER — Ambulatory Visit (INDEPENDENT_AMBULATORY_CARE_PROVIDER_SITE_OTHER): Payer: BLUE CROSS/BLUE SHIELD | Admitting: Family Medicine

## 2016-04-20 ENCOUNTER — Observation Stay (HOSPITAL_COMMUNITY)
Admission: EM | Admit: 2016-04-20 | Discharge: 2016-04-23 | Disposition: A | Discharge status: Home / Self Care | Payer: BLUE CROSS/BLUE SHIELD | Attending: Internal Medicine | Admitting: Internal Medicine

## 2016-04-20 VITALS — BP 160/80 | HR 105 | Temp 98.0°F | Ht 60.5 in | Wt 105.0 lb

## 2016-04-20 DIAGNOSIS — S92512A Displaced fracture of proximal phalanx of left lesser toe(s), initial encounter for closed fracture: Secondary | ICD-10-CM | POA: Insufficient documentation

## 2016-04-20 DIAGNOSIS — Z96641 Presence of right artificial hip joint: Secondary | ICD-10-CM | POA: Insufficient documentation

## 2016-04-20 DIAGNOSIS — R4182 Altered mental status, unspecified: Secondary | ICD-10-CM | POA: Diagnosis present

## 2016-04-20 DIAGNOSIS — Z7722 Contact with and (suspected) exposure to environmental tobacco smoke (acute) (chronic): Secondary | ICD-10-CM | POA: Insufficient documentation

## 2016-04-20 DIAGNOSIS — M419 Scoliosis, unspecified: Secondary | ICD-10-CM | POA: Insufficient documentation

## 2016-04-20 DIAGNOSIS — S99922A Unspecified injury of left foot, initial encounter: Secondary | ICD-10-CM | POA: Diagnosis not present

## 2016-04-20 DIAGNOSIS — M810 Age-related osteoporosis without current pathological fracture: Secondary | ICD-10-CM | POA: Insufficient documentation

## 2016-04-20 DIAGNOSIS — E039 Hypothyroidism, unspecified: Secondary | ICD-10-CM | POA: Diagnosis not present

## 2016-04-20 DIAGNOSIS — J4530 Mild persistent asthma, uncomplicated: Secondary | ICD-10-CM | POA: Insufficient documentation

## 2016-04-20 DIAGNOSIS — R079 Chest pain, unspecified: Secondary | ICD-10-CM | POA: Diagnosis not present

## 2016-04-20 DIAGNOSIS — L03116 Cellulitis of left lower limb: Secondary | ICD-10-CM

## 2016-04-20 DIAGNOSIS — F0630 Mood disorder due to known physiological condition, unspecified: Secondary | ICD-10-CM | POA: Insufficient documentation

## 2016-04-20 DIAGNOSIS — R3915 Urgency of urination: Secondary | ICD-10-CM | POA: Diagnosis not present

## 2016-04-20 DIAGNOSIS — M7989 Other specified soft tissue disorders: Secondary | ICD-10-CM | POA: Diagnosis not present

## 2016-04-20 DIAGNOSIS — F309 Manic episode, unspecified: Secondary | ICD-10-CM | POA: Diagnosis present

## 2016-04-20 DIAGNOSIS — R410 Disorientation, unspecified: Secondary | ICD-10-CM | POA: Diagnosis not present

## 2016-04-20 DIAGNOSIS — I10 Essential (primary) hypertension: Secondary | ICD-10-CM | POA: Insufficient documentation

## 2016-04-20 DIAGNOSIS — R7303 Prediabetes: Secondary | ICD-10-CM | POA: Insufficient documentation

## 2016-04-20 DIAGNOSIS — E0590 Thyrotoxicosis, unspecified without thyrotoxic crisis or storm: Secondary | ICD-10-CM | POA: Diagnosis not present

## 2016-04-20 DIAGNOSIS — Z9049 Acquired absence of other specified parts of digestive tract: Secondary | ICD-10-CM | POA: Diagnosis not present

## 2016-04-20 DIAGNOSIS — I700 Atherosclerosis of aorta: Secondary | ICD-10-CM | POA: Diagnosis not present

## 2016-04-20 DIAGNOSIS — Z7982 Long term (current) use of aspirin: Secondary | ICD-10-CM | POA: Insufficient documentation

## 2016-04-20 DIAGNOSIS — E785 Hyperlipidemia, unspecified: Secondary | ICD-10-CM | POA: Insufficient documentation

## 2016-04-20 DIAGNOSIS — J439 Emphysema, unspecified: Secondary | ICD-10-CM | POA: Insufficient documentation

## 2016-04-20 DIAGNOSIS — W19XXXA Unspecified fall, initial encounter: Secondary | ICD-10-CM | POA: Diagnosis not present

## 2016-04-20 DIAGNOSIS — F319 Bipolar disorder, unspecified: Principal | ICD-10-CM | POA: Insufficient documentation

## 2016-04-20 DIAGNOSIS — R2242 Localized swelling, mass and lump, left lower limb: Secondary | ICD-10-CM | POA: Diagnosis not present

## 2016-04-20 DIAGNOSIS — Z79899 Other long term (current) drug therapy: Secondary | ICD-10-CM | POA: Diagnosis not present

## 2016-04-20 DIAGNOSIS — G9340 Encephalopathy, unspecified: Secondary | ICD-10-CM | POA: Insufficient documentation

## 2016-04-20 DIAGNOSIS — Q72899 Other reduction defects of unspecified lower limb: Secondary | ICD-10-CM | POA: Insufficient documentation

## 2016-04-20 DIAGNOSIS — Z7951 Long term (current) use of inhaled steroids: Secondary | ICD-10-CM | POA: Insufficient documentation

## 2016-04-20 DIAGNOSIS — M1990 Unspecified osteoarthritis, unspecified site: Secondary | ICD-10-CM | POA: Insufficient documentation

## 2016-04-20 DIAGNOSIS — H9190 Unspecified hearing loss, unspecified ear: Secondary | ICD-10-CM | POA: Insufficient documentation

## 2016-04-20 DIAGNOSIS — R350 Frequency of micturition: Secondary | ICD-10-CM | POA: Diagnosis not present

## 2016-04-20 DIAGNOSIS — Z9889 Other specified postprocedural states: Secondary | ICD-10-CM | POA: Insufficient documentation

## 2016-04-20 HISTORY — DX: Unspecified fall, initial encounter: W19.XXXA

## 2016-04-20 LAB — BASIC METABOLIC PANEL
ANION GAP: 8 (ref 5–15)
BUN: 9 mg/dL (ref 6–20)
CO2: 22 mmol/L (ref 22–32)
Calcium: 9.8 mg/dL (ref 8.9–10.3)
Chloride: 108 mmol/L (ref 101–111)
Creatinine, Ser: 0.57 mg/dL (ref 0.44–1.00)
GLUCOSE: 136 mg/dL — AB (ref 65–99)
POTASSIUM: 3.7 mmol/L (ref 3.5–5.1)
Sodium: 138 mmol/L (ref 135–145)

## 2016-04-20 LAB — CBC
HEMATOCRIT: 39.3 % (ref 36.0–46.0)
HEMOGLOBIN: 13 g/dL (ref 12.0–15.0)
MCH: 29 pg (ref 26.0–34.0)
MCHC: 33.1 g/dL (ref 30.0–36.0)
MCV: 87.7 fL (ref 78.0–100.0)
Platelets: 346 10*3/uL (ref 150–400)
RBC: 4.48 MIL/uL (ref 3.87–5.11)
RDW: 14.8 % (ref 11.5–15.5)
WBC: 9.8 10*3/uL (ref 4.0–10.5)

## 2016-04-20 LAB — POCT CBC
Granulocyte percent: 70.5 %G (ref 37–80)
HEMATOCRIT: 38.5 % (ref 37.7–47.9)
Hemoglobin: 13.4 g/dL (ref 12.2–16.2)
LYMPH, POC: 2.1 (ref 0.6–3.4)
MCH: 30 pg (ref 27–31.2)
MCHC: 34.9 g/dL (ref 31.8–35.4)
MCV: 86 fL (ref 80–97)
MID (CBC): 0.7 (ref 0–0.9)
MPV: 6.9 fL (ref 0–99.8)
POC GRANULOCYTE: 6.6 (ref 2–6.9)
POC LYMPH PERCENT: 22.1 %L (ref 10–50)
POC MID %: 7.4 % (ref 0–12)
Platelet Count, POC: 317 10*3/uL (ref 142–424)
RBC: 4.48 M/uL (ref 4.04–5.48)
RDW, POC: 14.2 %
WBC: 9.4 10*3/uL (ref 4.6–10.2)

## 2016-04-20 LAB — THYROID PANEL WITH TSH
FREE THYROXINE INDEX: 3.6 (ref 1.4–3.8)
T3 UPTAKE: 31 % (ref 22–35)
T4, Total: 11.6 ug/dL (ref 4.5–12.0)
TSH: 2.71 mIU/L

## 2016-04-20 LAB — PROTIME-INR
INR: 0.9
PROTHROMBIN TIME: 12.1 s (ref 11.4–15.2)

## 2016-04-20 LAB — POCT URINALYSIS DIP (MANUAL ENTRY)
BILIRUBIN UA: NEGATIVE
BILIRUBIN UA: NEGATIVE
GLUCOSE UA: NEGATIVE
Leukocytes, UA: NEGATIVE
NITRITE UA: NEGATIVE
PH UA: 5.5
Protein Ur, POC: NEGATIVE
RBC UA: NEGATIVE
SPEC GRAV UA: 1.02
Urobilinogen, UA: 0.2

## 2016-04-20 LAB — COMPREHENSIVE METABOLIC PANEL
ALT: 22 U/L (ref 6–29)
AST: 30 U/L (ref 10–35)
Albumin: 4 g/dL (ref 3.6–5.1)
Alkaline Phosphatase: 119 U/L (ref 33–130)
BILIRUBIN TOTAL: 0.3 mg/dL (ref 0.2–1.2)
BUN: 13 mg/dL (ref 7–25)
CO2: 22 mmol/L (ref 20–31)
CREATININE: 0.62 mg/dL (ref 0.60–0.93)
Calcium: 9.7 mg/dL (ref 8.6–10.4)
Chloride: 107 mmol/L (ref 98–110)
GLUCOSE: 118 mg/dL — AB (ref 65–99)
Potassium: 3.8 mmol/L (ref 3.5–5.3)
SODIUM: 140 mmol/L (ref 135–146)
Total Protein: 7.1 g/dL (ref 6.1–8.1)

## 2016-04-20 LAB — URINALYSIS, ROUTINE W REFLEX MICROSCOPIC
BILIRUBIN URINE: NEGATIVE
GLUCOSE, UA: NEGATIVE mg/dL
KETONES UR: NEGATIVE mg/dL
Leukocytes, UA: NEGATIVE
Nitrite: NEGATIVE
PROTEIN: NEGATIVE mg/dL
Specific Gravity, Urine: 1.007 (ref 1.005–1.030)
pH: 6 (ref 5.0–8.0)

## 2016-04-20 LAB — POC MICROSCOPIC URINALYSIS (UMFC): Mucus: ABSENT

## 2016-04-20 LAB — URINE MICROSCOPIC-ADD ON: BACTERIA UA: NONE SEEN

## 2016-04-20 LAB — I-STAT TROPONIN, ED
Troponin i, poc: 0.01 ng/mL (ref 0.00–0.08)
Troponin i, poc: 0.01 ng/mL (ref 0.00–0.08)

## 2016-04-20 LAB — GLUCOSE, POCT (MANUAL RESULT ENTRY): POC Glucose: 120 mg/dl — AB (ref 70–99)

## 2016-04-20 LAB — BRAIN NATRIURETIC PEPTIDE: B NATRIURETIC PEPTIDE 5: 98.6 pg/mL (ref 0.0–100.0)

## 2016-04-20 LAB — T4, FREE: FREE T4: 1.14 ng/dL — AB (ref 0.61–1.12)

## 2016-04-20 LAB — ETHANOL

## 2016-04-20 LAB — POCT SEDIMENTATION RATE: POCT SED RATE: 25 mm/hr — AB (ref 0–22)

## 2016-04-20 LAB — D-DIMER, QUANTITATIVE (NOT AT ARMC): D DIMER QUANT: 1.57 ug{FEU}/mL — AB (ref 0.00–0.50)

## 2016-04-20 LAB — AMMONIA: Ammonia: 17 umol/L (ref 9–35)

## 2016-04-20 LAB — TSH: TSH: 2.352 u[IU]/mL (ref 0.350–4.500)

## 2016-04-20 LAB — TROPONIN I

## 2016-04-20 MED ORDER — DOXYCYCLINE HYCLATE 100 MG PO TABS
100.0000 mg | ORAL_TABLET | Freq: Two times a day (BID) | ORAL | Status: DC
  Administered 2016-04-21 – 2016-04-23 (×5): 100 mg via ORAL
  Filled 2016-04-20 (×5): qty 1

## 2016-04-20 MED ORDER — LORAZEPAM 2 MG/ML IJ SOLN: 0.5000 mg | Freq: Four times a day (QID) | INTRAMUSCULAR | Status: DC | PRN

## 2016-04-20 MED ORDER — ASPIRIN 81 MG PO CHEW
324.0000 mg | CHEWABLE_TABLET | Freq: Once | ORAL | Status: AC
  Administered 2016-04-20: 324 mg via ORAL
  Filled 2016-04-20: qty 4

## 2016-04-20 MED ORDER — LEVOTHYROXINE SODIUM 88 MCG PO TABS
88.0000 ug | ORAL_TABLET | Freq: Every day | ORAL | Status: DC
  Administered 2016-04-21 – 2016-04-23 (×3): 88 ug via ORAL
  Filled 2016-04-20 (×4): qty 1

## 2016-04-20 MED ORDER — AMLODIPINE BESYLATE 5 MG PO TABS
5.0000 mg | ORAL_TABLET | Freq: Every day | ORAL | Status: DC
  Administered 2016-04-21 – 2016-04-23 (×3): 5 mg via ORAL
  Filled 2016-04-20 (×3): qty 1

## 2016-04-20 MED ORDER — ACETAMINOPHEN 325 MG PO TABS
650.0000 mg | ORAL_TABLET | Freq: Four times a day (QID) | ORAL | Status: DC | PRN
  Administered 2016-04-21 (×3): 650 mg via ORAL
  Filled 2016-04-20 (×3): qty 2

## 2016-04-20 MED ORDER — ONDANSETRON HCL 4 MG PO TABS: 4.0000 mg | ORAL_TABLET | Freq: Four times a day (QID) | ORAL | Status: DC | PRN

## 2016-04-20 MED ORDER — ONDANSETRON HCL 4 MG/2ML IJ SOLN
4.0000 mg | Freq: Four times a day (QID) | INTRAMUSCULAR | Status: DC | PRN
  Administered 2016-04-22: 4 mg via INTRAVENOUS
  Filled 2016-04-20: qty 2

## 2016-04-20 MED ORDER — ALBUTEROL SULFATE (2.5 MG/3ML) 0.083% IN NEBU: 2.5000 mg | INHALATION_SOLUTION | RESPIRATORY_TRACT | Status: DC | PRN

## 2016-04-20 MED ORDER — SODIUM CHLORIDE 0.9 % IV SOLN
INTRAVENOUS | Status: AC
  Administered 2016-04-20: 23:00:00 via INTRAVENOUS

## 2016-04-20 MED ORDER — LATANOPROST 0.005 % OP SOLN
1.0000 [drp] | Freq: Every day | OPHTHALMIC | Status: DC
  Administered 2016-04-21 – 2016-04-22 (×3): 1 [drp] via OPHTHALMIC
  Filled 2016-04-20: qty 2.5

## 2016-04-20 MED ORDER — VANCOMYCIN HCL IN DEXTROSE 750-5 MG/150ML-% IV SOLN
750.0000 mg | INTRAVENOUS | Status: DC
  Filled 2016-04-20: qty 150

## 2016-04-20 MED ORDER — IOPAMIDOL (ISOVUE-300) INJECTION 61%
INTRAVENOUS | Status: AC
  Filled 2016-04-20: qty 100

## 2016-04-20 MED ORDER — IOPAMIDOL (ISOVUE-370) INJECTION 76%
80.0000 mL | Freq: Once | INTRAVENOUS | Status: AC | PRN
  Administered 2016-04-20: 50 mL via INTRAVENOUS

## 2016-04-20 MED ORDER — ACETAMINOPHEN 650 MG RE SUPP
650.0000 mg | Freq: Four times a day (QID) | RECTAL | Status: DC | PRN
Start: 2016-04-20 — End: 2016-04-23

## 2016-04-20 MED ORDER — VANCOMYCIN HCL IN DEXTROSE 1-5 GM/200ML-% IV SOLN
1000.0000 mg | Freq: Once | INTRAVENOUS | Status: AC
  Administered 2016-04-20: 1000 mg via INTRAVENOUS
  Filled 2016-04-20: qty 200

## 2016-04-20 MED ORDER — SODIUM CHLORIDE 0.9% FLUSH
3.0000 mL | Freq: Two times a day (BID) | INTRAVENOUS | Status: DC
  Administered 2016-04-20 – 2016-04-23 (×6): 3 mL via INTRAVENOUS

## 2016-04-20 NOTE — H&P (Signed)
Cheryl [name] BJY:[id number] DOB: [birth year] DOA: 04/20/2016     PCP: [name], MD   Outpatient Specialists: pulmonology Sood,  Patient coming from:    home Lives   With family   Chief Complaint: confusion  HPI: Cheryl [name] is a 70 y.o. female with medical history significant of depression manic episode which felt to be secondary to hyperthyroidism PUD , Right hip replacement, HTN, hypothyroidism,  emphysema  Presented with pressured speech and inability to sleep for past 3 days urine has been dark patient has fallen 1 week ago and has swelling of her left second toe. Patient endorses trouble sleeping. Family feels that she has not been herself for past 3 days. No new medications no change in her medications. Patient does not drink or use any substances. She lives with her husband. Patient endorsed chest pain has been on and off for past one month but unable to provide any details.  Examination has been endorsing mild left chest pain radiates to her left arm for past month has been intermittent. Her daughter states that 3 years ago she's been admitted for similar episode which brought to be related to her thyroid disease Patient have had left leg edema unsure for how long. Says the same leg that has swollen toe. He was seen today by her primary care provider who sent her into much department to be evaluated for PE She reports being compliant with her medications. Patient had hip replacement and postoperatively was on several 2 but currently there has been discontinued   IN ER:  Temp (24hrs), Avg:97.9 F (36.6 C), Min:97.8 F (36.6 C), Max:98 F (36.7 C)     Respirations 20 pulse 76 BP 135/98 Troponin 0.01 ammonia level 17 BNP 98.6 D.dimer elevated to 1.57 CT head unremarkable Chest x-ray nonacute  thyroid studies without significant abnormalities Dopplers were done and negative for DVT Given persistent swelling patient was initiated on vancomycin for possible  cellulitis  Following Medications were ordered in ER: Medications  vancomycin (VANCOCIN) IVPB 1000 mg/200 mL premix (1,000 mg Intravenous New Bag/Given 04/20/16 2052)  vancomycin (VANCOCIN) IVPB 750 mg/150 ml premix (not administered)  aspirin chewable tablet 324 mg (324 mg Oral Given 04/20/16 1539)      Hospitalist was called for admission for Acute delirium/manic state intermittent chest pain  Review of Systems:    Pertinent positives include:  confusion chest pain,  Constitutional:  No weight loss, night sweats, Fevers, chills, fatigue, weight loss  HEENT:  No headaches, Difficulty swallowing,Tooth/dental problems,Sore throat,  No sneezing, itching, ear ache, nasal congestion, post nasal drip,  Cardio-vascular:  No Orthopnea, PND, anasarca, dizziness, palpitations.no Bilateral lower extremity swelling  GI:  No heartburn, indigestion, abdominal pain, nausea, vomiting, diarrhea, change in bowel habits, loss of appetite, melena, blood in stool, hematemesis Resp:  no shortness of breath at rest. No dyspnea on exertion, No excess mucus, no productive cough, No non-productive cough, No coughing up of blood.No change in color of mucus.No wheezing. Skin:  no rash or lesions. No jaundice GU:  no dysuria, change in color of urine, no urgency or frequency. No straining to urinate.  No flank pain.  Musculoskeletal:  No joint pain or no joint swelling. No decreased range of motion. No back pain.  Psych:  No change in mood or affect. No depression or anxiety. No memory loss.  Neuro: no localizing neurological complaints, no tingling, no weakness, no double vision, no gait abnormality, no slurred speech, no  As per HPI  otherwise 10 point review of systems negative.   Past Medical History: Past Medical History:  Diagnosis Date  . Abnormal heart rhythm   . Allergic rhinitis   . Arthritis    "right hip" (12/17/2015)  . Asthma   . Cardiomegaly   . Duodenal ulcer   . Emphysema   .  Family history of anesthesia complication    TWIN SISTER HAS DELAYED REACTION -with "caine" meds  . Heart murmur   . Hyperlipidemia   . Hypertension   . Hypothyroidism    SEVERE   . Mitral valve regurgitation   . Osteoporosis   . Partial deafness   . Pneumonia   . Shortness of breath    Past Surgical History:  Procedure Laterality Date  . ABDOMINAL HYSTERECTOMY    . CATARACT EXTRACTION W/ INTRAOCULAR LENS  IMPLANT, BILATERAL Bilateral ~ 2015  . CHOLECYSTECTOMY OPEN  ?1980s  . CLEFT PALATE REPAIR  1949  . INNER EAR SURGERY Bilateral   . JOINT REPLACEMENT    . REPAIR OF PERFORATED ULCER  ?1980s   "duodenal"  . THYROIDECTOMY  2000s  . TONSILLECTOMY AND ADENOIDECTOMY  1950s  . TOTAL HIP ARTHROPLASTY Right 12/16/2015  . TOTAL HIP ARTHROPLASTY Right 12/16/2015   Procedure: RIGHT TOTAL HIP ARTHROPLASTY ANTERIOR APPROACH;  Surgeon: Tarry KosNaiping M Xu, MD;  Location: [facility] OR;  Service: Orthopedics;  Laterality: Right;  . TUBAL LIGATION       Social History:  Ambulatory   independently     reports that she is a non-smoker but has been exposed to tobacco smoke. She has never used smokeless tobacco. She reports that she does not drink alcohol or use drugs.  Allergies:   Allergies  Allergen Reactions  . Aspirin     REACTION: asthma flare  . Codeine     REACTION: ulcer  . Orange Juice [Orange Oil]     Gi upset       Family History:   Family History  Problem Relation Age of Onset  . Heart disease Mother   . Clotting disorder Mother   . Cancer Mother   . Allergies Sister     X2  . Osteoporosis Maternal Grandmother     Medications: Prior to Admission medications   Medication Sig Start Date End Date Taking? Authorizing Provider  acetaminophen (TYLENOL) 500 MG tablet Take 1-2 tablets (500-1,000 mg total) by mouth every 6 (six) hours as needed for mild pain or moderate pain. 12/16/15  Yes Naiping Donnelly StagerM Xu, MD  albuterol (PROAIR HFA) 108 (90 Base) MCG/ACT inhaler Inhale 2 puffs into  the lungs every 4 (four) hours as needed for wheezing or shortness of breath. 01/16/16  Yes [name], MD  amLODipine-benazepril (LOTREL) 5-20 MG capsule Take 1 capsule by mouth every evening. 01/16/16  Yes [name], MD  aspirin EC 81 MG tablet Take 324 mg by mouth once.   Yes Historical Provider, MD  bimatoprost (LUMIGAN) 0.03 % ophthalmic solution Place 1 drop into both eyes at bedtime.    Yes Historical Provider, MD  Fluticasone-Salmeterol (ADVAIR DISKUS) 500-50 MCG/DOSE AEPB Inhale 1 puff into the lungs 2 (two) times daily. 01/16/16  Yes [name], MD  levothyroxine (SYNTHROID) 88 MCG tablet Take 1 tablet (88 mcg total) by mouth daily before breakfast. 01/18/16  Yes [name], MD  HYDROmorphone (DILAUDID) 4 MG tablet Take 1 tablet (4 mg total) by mouth every 3 (three) hours as needed for severe pain. Patient not taking: Reported on 04/20/2016  12/16/15   Naiping Donnelly StagerM Xu, MD  nystatin (MYCOSTATIN) 100000 UNIT/ML suspension Take 5 mLs (500,000 Units total) by mouth 4 (four) times daily. Swish and gargle in mouth for as long as possible before swallowing Patient not taking: Reported on 04/20/2016 01/16/16   [name], MD    Physical Exam: Patient Vitals for the past 24 hrs:  BP Temp Temp src Pulse Resp SpO2 Weight  04/20/16 2045 161/76 - - 85 - 97 % -  04/20/16 2015 (!) 121/105 - - 83 - 97 % -  04/20/16 2000 (!) 145/105 - - 81 20 96 % -  04/20/16 1653 163/69 - Oral 81 16 98 % -  04/20/16 1322 - - - - - - 47.6 kg (105 lb)  04/20/16 1321 142/71 97.8 F (36.6 C) Oral 92 16 96 % -    1. General:  in No Acute distress 2. Psychological: Alert and  Oriented to self and situation but having pressured speech, flight of ideas  3. Head/ENT:     Dry Mucous Membranes                          Head Non traumatic, neck supple                          Normal  Dentition 4. SKIN:  decreased Skin turgor,  Skin clean Dry and intact no rash 5. Heart: Regular rate and rhythm no  Murmur, Rub or gallop 6. Lungs:   Clear to auscultation bilaterally, no wheezes or crackles   7. Abdomen: Soft,  non-tender, Non distended 8. Lower extremities: no clubbing, cyanosis, or edema 9. Neurologically Grossly intact, moving all 4 extremities equally   10. MSK: Normal range of motion   body mass index is 20.17 kg/m.  Labs on Admission:   Labs on Admission: I have personally reviewed following labs and imaging studies  CBC:  Recent Labs Lab 04/20/16 1149 04/20/16 1335  WBC 9.4 9.8  HGB 13.4 13.0  HCT 38.5 39.3  MCV 86.0 87.7  PLT  --  346   Basic Metabolic Panel:  Recent Labs Lab 04/20/16 1115 04/20/16 1335  NA 140 138  K 3.8 3.7  CL 107 108  CO2 22 22  GLUCOSE 118* 136*  BUN 13 9  CREATININE 0.62 0.57  CALCIUM 9.7 9.8   GFR: Estimated Creatinine Clearance: 47.6 mL/min (by C-G formula based on SCr of 0.57 mg/dL). Liver Function Tests:  Recent Labs Lab 04/20/16 1115  AST 30  ALT 22  ALKPHOS 119  BILITOT 0.3  PROT 7.1  ALBUMIN 4.0   No results for input(s): LIPASE, AMYLASE in the last 168 hours.  Recent Labs Lab 04/20/16 1712  AMMONIA 17   Coagulation Profile:  Recent Labs Lab 04/20/16 1710  INR 0.90   Cardiac Enzymes: No results for input(s): CKTOTAL, CKMB, CKMBINDEX, TROPONINI in the last 168 hours. BNP (last 3 results) No results for input(s): PROBNP in the last 8760 hours. HbA1C: No results for input(s): HGBA1C in the last 72 hours. CBG: No results for input(s): GLUCAP in the last 168 hours. Lipid Profile: No results for input(s): CHOL, HDL, LDLCALC, TRIG, CHOLHDL, LDLDIRECT in the last 72 hours. Thyroid Function Tests:  Recent Labs  04/20/16 1115 04/20/16 1710  TSH  --  2.352  T4TOTAL 11.6  --   FREET4  --  1.14*   Anemia Panel: No results for input(s):  VITAMINB12, FOLATE, FERRITIN, TIBC, IRON, RETICCTPCT in the last 72 hours. Urine analysis:    Component Value Date/Time   COLORURINE YELLOW 04/20/2016 1933   APPEARANCEUR CLEAR 04/20/2016 1933    LABSPEC 1.007 04/20/2016 1933   PHURINE 6.0 04/20/2016 1933   GLUCOSEU NEGATIVE 04/20/2016 1933   HGBUR TRACE (A) 04/20/2016 1933   BILIRUBINUR NEGATIVE 04/20/2016 1933   BILIRUBINUR negative 04/20/2016 1132   BILIRUBINUR neg 09/29/2014 1944   KETONESUR NEGATIVE 04/20/2016 1933   PROTEINUR NEGATIVE 04/20/2016 1933   UROBILINOGEN 0.2 04/20/2016 1132   UROBILINOGEN 0.2 01/24/2013 0602   NITRITE NEGATIVE 04/20/2016 1933   LEUKOCYTESUR NEGATIVE 04/20/2016 1933   Sepsis Labs: @LABRCNTIP (procalcitonin:4,lacticidven:4) )No results found for this or any previous visit (from the past 240 hour(s)).     UA  no evidence of UTI     Lab Results  Component Value Date   HGBA1C 5.7 01/16/2016    Estimated Creatinine Clearance: 47.6 mL/min (by C-G formula based on SCr of 0.57 mg/dL).  BNP (last 3 results) No results for input(s): PROBNP in the last 8760 hours.   ECG REPORT  Independently reviewed Rate: 89  Rhythm: sinus rythm ST&T Change: No acute ischemic changes   QTC 423  Filed Weights   04/20/16 1322  Weight: 47.6 kg (105 lb)     Cultures:    Component Value Date/Time   SDES URINE, CLEAN CATCH 01/24/2013 0602   SPECREQUEST Cipro x 2 doses 01/24/2013 0602   CULT NO GROWTH Performed at [facility] 01/24/2013 0602   REPTSTATUS 01/25/2013 FINAL 01/24/2013 0602     Radiological Exams on Admission: Dg Chest 2 View  Result Date: 04/20/2016 CLINICAL DATA:  Left-sided chest pain. EXAM: CHEST  2 VIEW COMPARISON:  Two-view chest x-ray 10/31/2015 FINDINGS: The heart size is normal. Emphysematous changes are again seen. There is no edema or effusion. No focal airspace disease is present. No significant effusions are present. The visualized soft tissues and bony thorax are unremarkable. Surgical clips are present at the gallbladder fossa. IMPRESSION: 1. No acute cardiopulmonary disease or significant interval change. 2. Negative two view chest. Electronically Signed   By:  Marin Roberts M.D.   On: 04/20/2016 14:16   Dg Lumbar Spine 2-3 Views  Result Date: 04/20/2016 CLINICAL DATA:  Pain following fall EXAM: LUMBAR SPINE - 2-3 VIEW COMPARISON:  None. FINDINGS: Frontal, lateral, and spot lumbosacral lateral images were obtained. There are 5 non-rib-bearing lumbar type vertebral bodies. There is thoracolumbar levoscoliosis. No fracture or spondylolisthesis is evident. There is moderately severe disc space narrowing at all levels in the lumbar region. No erosive change. There is atherosclerotic calcification in the aorta. IMPRESSION: Multifocal osteoarthritic change. Scoliosis. No fracture or spondylolisthesis. There is aortic atherosclerosis. Electronically Signed   By: Bretta Bang III M.D.   On: 04/20/2016 11:42   Ct Head Wo Contrast  Result Date: 04/20/2016 CLINICAL DATA:  Recent fall with increased confusion EXAM: CT HEAD WITHOUT CONTRAST TECHNIQUE: Contiguous axial images were obtained from the base of the skull through the vertex without intravenous contrast. COMPARISON:  01/22/2013 FINDINGS: Brain: No acute territorial infarction, intracranial hemorrhage or focal mass lesion is present. No extra-axial fluid collection. Moderate atrophy. Ventricles similar size and morphology compared to prior. Mild periventricular white matter hypodensity, presumed small vessel disease. Vascular: No hyperdense vessels.  Carotid artery calcifications. Skull: Heterogeneous attenuation likely related to osteopenia. Right mastoid sclerosis. No fracture. Sinuses/Orbits: Mucosal opacification of the ethmoids and right maxillary sinus. No acute orbital abnormality. Other:  None IMPRESSION: No CT evidence for acute intracranial abnormality. Electronically Signed   By: Jasmine Pang M.D.   On: 04/20/2016 18:39   Dg Foot Complete Left  Result Date: 04/20/2016 CLINICAL DATA:  Fall. EXAM: LEFT FOOT - COMPLETE 3+ VIEW COMPARISON:  01/08/2014. FINDINGS: Surgical can noted in the distal  left first metatarsal. Diffuse degenerative change. Diffuse osteopenia. An acute fracture of the distal portion of the proximal phalanx of the left second toe is noted. Old healed fracture of the proximal phalanx of the left great toe. IMPRESSION: 1. Acute fracture of the distal aspect of the proximal phalanx of the left second toe. 2. Diffuse degenerative changes. Postsurgical changes first metatarsal. Electronically Signed   By: Maisie Fus  Register   On: 04/20/2016 11:41    Chart has been reviewed    Assessment/Plan   70 y.o. female with medical history significant of depression manic episode which felt to be secondary to hyperthyroidism PUD , Right hip replacement, HTN, hypothyroidism,  emphysema being admitted for manic episode unclear etiology  Present on Admission: . Manic state (HCC) behavioral health consulted, we'll evaluate for any medical etiology, check folate acid and B-12 levels. If no resolution consider father imaging such as MRI if able to cooperate . Essential (primary) hypertension - continue Norvasc currently stable . Acute encephalopathy examination appears to be alert and oriented but has evidence of manic episode Chest pain given elevated d-dimer and left leg swelling will obtain CT Angela chest appreciate Dopplers negative for DVT but that does not exclude PE. Echo cardiac enzymes monitored on telemetry Left toe fracture will need outpatient follow-up with orthopedics Question cellulitis - patient was started on vancomycin, symptoms appear to be mild for now will switch to doxycycline  Other plan as per orders.  DVT prophylaxis:  SCD   Code Status:  FULL CODE    as per family   Family Communication:   Family  at  Bedside  plan of care was discussed with  Daughter,  Husband,   Disposition Plan:   May need psychiatric admission it does not resolve                                              Consults called: behavioral health  Admission status:     obs   Level of  care     tele            I have spent a total of 57 min on this admission    Matthan Sledge 04/20/2016, 11:19 PM    [facility]  Pager [phone]   after 2 AM please page floor coverage PA If 7AM-7PM, please contact the day team taking care of the patient  [email or web address]  Password [email or web address]

## 2016-04-20 NOTE — ED Notes (Signed)
Attempted to call report x 1  

## 2016-04-20 NOTE — ED Notes (Signed)
Cheryl CheshireDel [name], spouse [phone] May text if needed. States this is easier for communication.

## 2016-04-20 NOTE — ED Triage Notes (Signed)
PT reports her PCP Shaune Pollackonna Gates sent her for the infection in  Her LT second toe. Pt went to PCP today. The Lt 2nd toe red and swollen.

## 2016-04-20 NOTE — ED Notes (Signed)
Called Main Lab to add on D-dimer lab.

## 2016-04-20 NOTE — ED Provider Notes (Signed)
[facility]-EMERGENCY DEPT Provider Note   CSN: [id number] Arrival date & time: 04/20/16  1313     History   Chief Complaint Chief Complaint  Patient presents with  . Chest Pain    HPI LORI-ANN LINDFORS is a 70 y.o. female.  HPI   Asian 70 year old female sent here from her primary care physician. Patient had a yearly visit today. She was noted to be altered, acute delirium. Patient complained of recent fall. She has swelling to her left lower extremity. Patient had x-ray showing fracture her second toe. Patient has history of thyroid issues, son reports delirium once prior as a result of thyroid storm. However patient has not had thyroid removed. She is on levothyroxine reports taking it every day.   Patient also reports pain in the left shoulder after fall. No chest pain. Denies shortness of breath. Denies diaphoresis.    Past Medical History:  Diagnosis Date  . Abnormal heart rhythm   . Allergic rhinitis   . Arthritis    "right hip" (12/17/2015)  . Asthma   . Cardiomegaly   . Duodenal ulcer   . Emphysema   . Family history of anesthesia complication    TWIN SISTER HAS DELAYED REACTION -with "caine" meds  . Heart murmur   . Hyperlipidemia   . Hypertension   . Hypothyroidism    SEVERE   . Mitral valve regurgitation   . Osteoporosis   . Partial deafness   . Pneumonia   . Shortness of breath     Patient Active Problem List   Diagnosis Date Noted  . Abnormal heart rhythm 01/16/2016  . Hip joint replacement status 12/16/2015  . Prediabetes 04/02/2015  . Osteoporosis   . Mood disorder in conditions classified elsewhere 02/05/2013  . Severe hypothyroidism 01/24/2013  . 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 Hyperlipidemia 01/24/2013  . Abnormal LFTs 03/10/2011  . 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 BP (high blood pressure) 03/04/2011  . Adult hypothyroidism 03/04/2011  . Atrial enlargement, left 03/04/2011  . Left ventricular hypertrophy  03/04/2011  . Billowing mitral valve 03/04/2011  . Pulmonary hypertension 03/04/2011  . Pain in right hip 03/04/2011  . Disease of supporting structures of teeth 10/28/2010  . OP (osteoporosis) 09/01/2010  . Abnormal blood sugar 09/01/2010  . HLD (hyperlipidemia) 09/01/2010  . Essential (primary) hypertension 09/01/2010  . Avitaminosis D 09/01/2010  . Bipolar I disorder, single manic episode (HCC) 04/21/2010  . Difficulty hearing 08/17/2009  . Disorder of mitral valve 05/22/2009  . CANDIDIASIS, ORAL 03/06/2009  . Essential hypertension 05/09/2008  . ALLERGIC RHINITIS 05/09/2008  . Asthma 05/09/2008    Past Surgical History:  Procedure Laterality Date  . ABDOMINAL HYSTERECTOMY    . CATARACT EXTRACTION W/ INTRAOCULAR LENS  IMPLANT, BILATERAL Bilateral ~ 2015  . CHOLECYSTECTOMY OPEN  ?1980s  . CLEFT PALATE REPAIR  1949  . INNER EAR SURGERY Bilateral   . JOINT REPLACEMENT    . REPAIR OF PERFORATED ULCER  ?1980s   "duodenal"  . THYROIDECTOMY  2000s  . TONSILLECTOMY AND ADENOIDECTOMY  1950s  . TOTAL HIP ARTHROPLASTY Right 12/16/2015  . TOTAL HIP ARTHROPLASTY Right 12/16/2015   Procedure: RIGHT TOTAL HIP ARTHROPLASTY ANTERIOR APPROACH;  Surgeon: Tarry Kos, MD;  Location: [facility] OR;  Service: Orthopedics;  Laterality: Right;  . TUBAL LIGATION      OB History    No data available       Home Medications    Prior to Admission medications  Medication Sig Start Date End Date Taking? Authorizing Provider  acetaminophen (TYLENOL) 500 MG tablet Take 1-2 tablets (500-1,000 mg total) by mouth every 6 (six) hours as needed for mild pain or moderate pain. 12/16/15  Yes Naiping Donnelly Stager, MD  albuterol (PROAIR HFA) 108 (90 Base) MCG/ACT inhaler Inhale 2 puffs into the lungs every 4 (four) hours as needed for wheezing or shortness of breath. 01/16/16  Yes [name], MD  amLODipine-benazepril (LOTREL) 5-20 MG capsule Take 1 capsule by mouth every evening. 01/16/16  Yes [name], MD  aspirin EC 81  MG tablet Take 324 mg by mouth once.   Yes Historical Provider, MD  bimatoprost (LUMIGAN) 0.03 % ophthalmic solution Place 1 drop into both eyes at bedtime.    Yes Historical Provider, MD  Fluticasone-Salmeterol (ADVAIR DISKUS) 500-50 MCG/DOSE AEPB Inhale 1 puff into the lungs 2 (two) times daily. 01/16/16  Yes [name], MD  levothyroxine (SYNTHROID) 88 MCG tablet Take 1 tablet (88 mcg total) by mouth daily before breakfast. 01/18/16  Yes [name], MD  HYDROmorphone (DILAUDID) 4 MG tablet Take 1 tablet (4 mg total) by mouth every 3 (three) hours as needed for severe pain. Patient not taking: Reported on 04/20/2016 12/16/15   Tarry Kos, MD  nystatin (MYCOSTATIN) 100000 UNIT/ML suspension Take 5 mLs (500,000 Units total) by mouth 4 (four) times daily. Swish and gargle in mouth for as long as possible before swallowing Patient not taking: Reported on 04/20/2016 01/16/16   [name], MD  rivaroxaban (XARELTO) 10 MG TABS tablet Take 1 tablet (10 mg total) by mouth daily. Patient not taking: Reported on 04/20/2016 12/16/15   Tarry Kos, MD    Family History Family History  Problem Relation Age of Onset  . Heart disease Mother   . Clotting disorder Mother   . Cancer Mother   . Allergies Sister     X2  . Osteoporosis Maternal Grandmother     Social History Social History  Substance Use Topics  . Smoking status: Passive Smoke Exposure - Never Smoker  . Smokeless tobacco: Never Used     Comment: EXPOSED TO SECOND HAND SMOKE AS CHILD  . Alcohol use No     Allergies   Aspirin; Codeine; and Orange juice [orange oil]   Review of Systems Review of Systems  Unable to perform ROS: Mental status change     Physical Exam Updated Vital Signs BP 163/69 (BP Location: Right Arm)   Pulse 81   Temp 97.8 F (36.6 C) (Oral)   Resp 16   Wt 105 lb (47.6 kg)   SpO2 98%   BMI 20.17 kg/m   Physical Exam  Constitutional: She is oriented to person, place, and time. She appears well-developed  and well-nourished.  HENT:  Head: Normocephalic and atraumatic.  Right Ear: External ear normal.  Left Ear: External ear normal.  Dry mucous membranes.  Eyes: Right eye exhibits no discharge.  Neck:  Scar from thyroidectomy.  Cardiovascular: Normal rate.   No murmur heard. Pulmonary/Chest: Effort normal and breath sounds normal. She has no wheezes. She has no rales.  Abdominal: Soft. She exhibits no distension. There is no tenderness.  Musculoskeletal:  Left leg with swelling, pain edema. Left second toe with deformity.  Neurological: She is oriented to person, place, and time.  Skin: Skin is warm and dry. She is not diaphoretic.  Psychiatric:  Patient with odd affect, flight of ideas, manic-like behaviors. However is redirectable  and cognitively appears intact.  Nursing note and vitals reviewed.    ED Treatments / Results  Labs (all labs ordered are listed, but only abnormal results are displayed) Labs Reviewed  BASIC METABOLIC PANEL - Abnormal; Notable for the following:       Result Value   Glucose, Bld 136 (*)    All other components within normal limits  T4, FREE - Abnormal; Notable for the following:    Free T4 1.14 (*)    All other components within normal limits  CBC  BRAIN NATRIURETIC PEPTIDE  PROTIME-INR  TSH  AMMONIA  URINALYSIS, ROUTINE W REFLEX MICROSCOPIC (NOT AT [facility])  I-STAT TROPOININ, ED  I-STAT TROPOININ, ED    EKG  EKG Interpretation  Date/Time:  Wednesday April 20 2016 13:26:37 EDT Ventricular Rate:  89 PR Interval:  136 QRS Duration: 80 QT Interval:  348 QTC Calculation: 423 R Axis:   71 Text Interpretation:  Normal sinus rhythm Septal infarct , age undetermined Abnormal ECG No significant change since last tracing Confirmed by Kandis MannanMACKUEN, COURTNEY ([id number]) on 04/20/2016 4:55:22 PM       Radiology Dg Chest 2 View  Result Date: 04/20/2016 CLINICAL DATA:  Left-sided chest pain. EXAM: CHEST  2 VIEW COMPARISON:  Two-view chest x-ray  10/31/2015 FINDINGS: The heart size is normal. Emphysematous changes are again seen. There is no edema or effusion. No focal airspace disease is present. No significant effusions are present. The visualized soft tissues and bony thorax are unremarkable. Surgical clips are present at the gallbladder fossa. IMPRESSION: 1. No acute cardiopulmonary disease or significant interval change. 2. Negative two view chest. Electronically Signed   By: Marin Robertshristopher  Mattern M.D.   On: 04/20/2016 14:16   Dg Lumbar Spine 2-3 Views  Result Date: 04/20/2016 CLINICAL DATA:  Pain following fall EXAM: LUMBAR SPINE - 2-3 VIEW COMPARISON:  None. FINDINGS: Frontal, lateral, and spot lumbosacral lateral images were obtained. There are 5 non-rib-bearing lumbar type vertebral bodies. There is thoracolumbar levoscoliosis. No fracture or spondylolisthesis is evident. There is moderately severe disc space narrowing at all levels in the lumbar region. No erosive change. There is atherosclerotic calcification in the aorta. IMPRESSION: Multifocal osteoarthritic change. Scoliosis. No fracture or spondylolisthesis. There is aortic atherosclerosis. Electronically Signed   By: Bretta BangWilliam  Woodruff III M.D.   On: 04/20/2016 11:42   Ct Head Wo Contrast  Result Date: 04/20/2016 CLINICAL DATA:  Recent fall with increased confusion EXAM: CT HEAD WITHOUT CONTRAST TECHNIQUE: Contiguous axial images were obtained from the base of the skull through the vertex without intravenous contrast. COMPARISON:  01/22/2013 FINDINGS: Brain: No acute territorial infarction, intracranial hemorrhage or focal mass lesion is present. No extra-axial fluid collection. Moderate atrophy. Ventricles similar size and morphology compared to prior. Mild periventricular white matter hypodensity, presumed small vessel disease. Vascular: No hyperdense vessels.  Carotid artery calcifications. Skull: Heterogeneous attenuation likely related to osteopenia. Right mastoid sclerosis. No  fracture. Sinuses/Orbits: Mucosal opacification of the ethmoids and right maxillary sinus. No acute orbital abnormality. Other: None IMPRESSION: No CT evidence for acute intracranial abnormality. Electronically Signed   By: Jasmine PangKim  Fujinaga M.D.   On: 04/20/2016 18:39   Dg Foot Complete Left  Result Date: 04/20/2016 CLINICAL DATA:  Fall. EXAM: LEFT FOOT - COMPLETE 3+ VIEW COMPARISON:  01/08/2014. FINDINGS: Surgical can noted in the distal left first metatarsal. Diffuse degenerative change. Diffuse osteopenia. An acute fracture of the distal portion of the proximal phalanx of the left second toe is noted. Old healed fracture of  the proximal phalanx of the left great toe. IMPRESSION: 1. Acute fracture of the distal aspect of the proximal phalanx of the left second toe. 2. Diffuse degenerative changes. Postsurgical changes first metatarsal. Electronically Signed   By: Maisie Fushomas  Register   On: 04/20/2016 11:41    Procedures Procedures (including critical care time)  Medications Ordered in ED Medications  vancomycin (VANCOCIN) IVPB 1000 mg/200 mL premix (not administered)  vancomycin (VANCOCIN) IVPB 750 mg/150 ml premix (not administered)  aspirin chewable tablet 324 mg (324 mg Oral Given 04/20/16 1539)     Initial Impression / Assessment and Plan / ED Course  I have reviewed the triage vital signs and the nursing notes.  Pertinent labs & imaging results that were available during my care of the patient were reviewed by me and considered in my medical decision making (see chart for details).  Clinical Course    Patient is a 70 year old female presenting with multiple symptoms. Patient was seen today at her primary care physician's office and sent here for altered mental status and rule out DVT. Patient appears mildly altered today with repetitive thoughts. Patient's son reports that she has been slowly becoming recently confused over the last 2 weeks. Patient had a fall 2 weeks ago and sustained  fracture to the left little toe. Patient has significant swelling to that left lower extremity. Question DVT versus edema from relative illness immobility of that limb.   Given fall and altered mental status we'll do CT. We'll also look for metabolic sources of altered mental status including infection, UA, chest x-ray, CT of the head, and thryoid studies.   7:56 PM Patient's DVT study is negative. Patient CT negative. TSH is normal. We are waiting UA. We'll treat for infection of the left lower extremity. However given patient's continued altered mental status we'll need to admit for further evaluation. I would update family and patient reintroduced herself to me for the third time today. Patient normally lives by herself and is able to care for herself at home. Final Clinical Impressions(s) / ED Diagnoses   Final diagnoses:  None    New Prescriptions New Prescriptions   No medications on file     Kataleya Zaugg Randall AnLyn Keilyn Haggard, MD 04/20/16 2321

## 2016-04-20 NOTE — Progress Notes (Signed)
*  PRELIMINARY RESULTS* Vascular Ultrasound Left lower extremity venous duplex has been completed.  Preliminary findings: no evidence of DVT. Left baker's cyst noted.  Farrel DemarkJill Eunice, RDMS, RVT  04/20/2016, 5:46 PM

## 2016-04-20 NOTE — Patient Instructions (Signed)
I am concerned she could have a DVT/PE and/or possible infection of the leg. We need to have her seen in the ER for a chest CTA and ultrasound of your left leg. g Delirium Delirium is a state of mental confusion. It comes on quickly and causes significant changes in a person's thinking and behavior. People with delirium usually have trouble paying attention to what is going on or knowing where they are. They may become very withdrawn or very emotional and unable to sit still. They may even see or feel things that are not there (hallucinations). Delirium is a sign of a serious underlying medical condition. CAUSES Delirium occurs when something suddenly affects the signals that the brain sends out. Brain signals can be affected by anything that puts severe stress on the body and brain and causes brain chemicals to be out of balance. The most common causes of delirium include:  Infections. These may be bacterial, viral, fungal, or protozoal.  Medicines. These include many over-the-counter and prescription medicines.  Recreational drugs.  Substance withdrawal. This occurs with sudden discontinuation of alcohol, certain medicines, or recreational drugs.  Surgery.  Sudden vascular events, such as stroke, brain hemorrhage, and severe migraine.  Other brain disorders, such as tumors, seizures, and physical head trauma.  Metabolic disorders, such as kidney or liver failure.  Low blood oxygen (anoxia). This may occur with lung disease, cardiac arrest, or carbon monoxide poisoning.  Hormone imbalances (endocrinopathies), such as an overactive thyroid (hyperthyroidism) or underactive thyroid (hypothyroidism).  Vitamin deficiencies. RISK FACTORS This condition is more likely to develop in:  Children.  Older people.  People who live alone.  People who have vision loss or hearing loss.  People who have existing brain disease, such as dementia.  People who have long-lasting (chronic)  medical conditions, such as heart disease.  People who are hospitalized for long periods of time. SYMPTOMS Delirium starts with a sudden change in a person's thinking or behavior. Symptoms come and go (fluctuate) over time, and they are often worse at the end of the day. Symptoms include:  Not being able to stay awake (drowsiness) or pay attention.  Being confused about places, time, and people.  Forgetfulness.  Having extreme energy levels. These may be low or high.  Changes in sleep patterns.  Extreme mood swings, such as anger or anxiety.  Focusing on things or ideas that are not important.  Rambling and senseless talking.  Difficulty speaking, understanding speech, or both.  Hallucinations.  Tremor or unsteady gait. DIAGNOSIS People with delirium may not realize that they have the condition. Often, a family member or health care provider is the first person to notice the changes. The health care provider will obtain a detailed history of current symptoms, medical issues, medicines, and recreational drug use. The health care provider will perform a mental status examination by:  Asking questions to check for confusion.  Watching for abnormal behavior. The health care provider may perform a physical exam and order lab tests or additional studies to determine the cause of the delirium. TREATMENT Treatment of delirium depends on the cause and severity. Delirium usually goes away within days or weeks of treating the underlying cause. In the meantime, the person should not be left alone because he or she may accidentally cause self-harm. Treatment includes supportive care, such as:  Increased light during the day and decreased light at night.  Low noise level.  Uninterrupted sleep.  A regular daily schedule.  Clocks and calendars to help  with orientation.  Familiar objects, including the person's pictures and clothing.  Frequent visits from familiar family and  friends.  Healthy diet.  Exercise. In more severe cases of delirium, medicine may be prescribed to help the person to keep calm and think more clearly. HOME CARE INSTRUCTIONS  Any supportive care should be continued as told by the health care provider.  All medicines should be used as told by the health care provider. This is important.  The health care provider should be consulted before over-the-counter medicines, herbs, or supplements are used.  All follow-up visits should be kept as told by the health care provider. This is important.  Alcohol and recreational drugs should be avoided as told by the health care provider. SEEK MEDICAL CARE IF:  Symptoms do not get better or they become worse.  New symptoms of delirium develop.  Caring for the person at home does not seem safe.  Eating, drinking, or communicating stops.  There are side effects of medicines, such as changes in sleep patterns, dizziness, weight gain, restlessness, movement changes, or tremors. SEEK IMMEDIATE MEDICAL CARE IF:  Serious thoughts occur about self-harm or about hurting others.  There are serious side effects of medicine, such as:  Swelling of the face, lips, tongue, or throat.  Fever, confusion, muscle spasms, or seizures.   This information is not intended to replace advice given to you by your health care provider. Make sure you discuss any questions you have with your health care provider.   Document Released: 02/29/2012 Document Revised: 10/21/2014 Document Reviewed: 07/30/2014 Elsevier Interactive Patient Education [employer]2016 [employer] Inc.

## 2016-04-20 NOTE — Progress Notes (Signed)
Pharmacy Antibiotic Note Rachael DarbyDonna E Lopezgarcia is a 70 y.o. female admitted on 04/20/2016 with cellulitis.  Pharmacy has been consulted for vancomycin dosing.  Plan: 1. Vancomycin 1000 mg IV x 1 now, followed by 750 mg every 24 hours starting on 11/2  Weight: 105 lb (47.6 kg)  Temp (24hrs), Avg:97.9 F (36.6 C), Min:97.8 F (36.6 C), Max:98 F (36.7 C)   Recent Labs Lab 04/20/16 1115 04/20/16 1149 04/20/16 1335  WBC  --  9.4 9.8  CREATININE 0.62  --  0.57    Estimated Creatinine Clearance: 47.6 mL/min (by C-G formula based on SCr of 0.57 mg/dL).    Allergies  Allergen Reactions  . Aspirin     REACTION: asthma flare  . Codeine     REACTION: ulcer  . Orange Juice [Orange Oil]     Gi upset    Antimicrobials this admission:  11/1 vancomycin >>   Dose adjustments this admission:  N/a  Microbiology results:  11/1 UCx: px   Thank you for allowing pharmacy to be a part of this patient's care.  Pollyann SamplesAndy Dorean Hiebert, PharmD, BCPS 04/20/2016, 7:50 PM Pager: [phone]

## 2016-04-20 NOTE — ED Triage Notes (Signed)
Pt arrives via POv from Urgent Care sent over for further eval of acute delirum and substernal chest pain for the last 2 weeks. Pt reports radiation of pain to L arm. Pt ambulatory, speaking in full sentences, NAD at present. Sent for CTA of chest and ultrasound of left leg. Noted 3+ dependent swelling to left leg.

## 2016-04-20 NOTE — Progress Notes (Signed)
Subjective:  By signing my name below, I, Cheryl [name], attest that this documentation has been prepared under the direction and in the presence of [name], MD Electronically Signed: [name], ED Scribe 04/20/2016 at 10:54 AM.   Patient ID: Cheryl [name], female    DOB: 1945-12-09, 70 y.o.   MRN: [id number]  Chief Complaint  Patient presents with  . Annual Exam    dark am urine, right breat pain,fell x1    HPI HPI Comments: Cheryl [name] is a 70 y.o. female who presents to the [facility] for an annual exam. Pt's daughter wants to have her thyroid levels checked again. Daughter states that pt was hospitalized 3 years ago for symptoms of irritability, confusion and inability to sleep. Pt reports left chest pain that radiates into her left arm for the past month. She has an allergy to aspirin. Pt reports right hip surgery 3 months ago and a fall 1 week ago with swelling in both legs.  Past Medical History:  Diagnosis Date  . Abnormal heart rhythm   . Allergic rhinitis   . Arthritis    "right hip" (12/17/2015)  . Asthma   . Cardiomegaly   . Duodenal ulcer   . Emphysema   . Family history of anesthesia complication    TWIN SISTER HAS DELAYED REACTION -with "caine" meds  . Heart murmur   . Hyperlipidemia   . Hypertension   . Hypothyroidism    SEVERE   . Mitral valve regurgitation   . Osteoporosis   . Partial deafness   . Pneumonia   . Shortness of breath    Current Outpatient Prescriptions on File Prior to Visit  Medication Sig Dispense Refill  . acetaminophen (TYLENOL) 500 MG tablet Take 1-2 tablets (500-1,000 mg total) by mouth every 6 (six) hours as needed for mild pain or moderate pain. 60 tablet 0  . albuterol (PROAIR HFA) 108 (90 Base) MCG/ACT inhaler Inhale 2 puffs into the lungs every 4 (four) hours as needed for wheezing or shortness of breath. 18 g 5  . amLODipine-benazepril (LOTREL) 5-20 MG capsule Take 1 capsule by mouth every evening. 90  capsule 1  . Fluticasone-Salmeterol (ADVAIR DISKUS) 500-50 MCG/DOSE AEPB Inhale 1 puff into the lungs 2 (two) times daily. 60 each 5  . levothyroxine (SYNTHROID) 88 MCG tablet Take 1 tablet (88 mcg total) by mouth daily before breakfast. 90 tablet 1  . bimatoprost (LUMIGAN) 0.03 % ophthalmic solution Place 1 drop into both eyes at bedtime.     [name] HYDROmorphone (DILAUDID) 4 MG tablet Take 1 tablet (4 mg total) by mouth every 3 (three) hours as needed for severe pain. (Patient not taking: Reported on 04/20/2016) 30 tablet 0  . nystatin (MYCOSTATIN) 100000 UNIT/ML suspension Take 5 mLs (500,000 Units total) by mouth 4 (four) times daily. Swish and gargle in mouth for as long as possible before swallowing (Patient not taking: Reported on 04/20/2016) 180 mL 0  . rivaroxaban (XARELTO) 10 MG TABS tablet Take 1 tablet (10 mg total) by mouth daily. (Patient not taking: Reported on 04/20/2016) 28 tablet 0   No current facility-administered medications on file prior to visit.    Allergies  Allergen Reactions  . Aspirin     REACTION: asthma flare  . Codeine     REACTION: ulcer  . Orange Juice [Orange Oil]     Gi upset   Past Surgical History:  Procedure Laterality Date  . ABDOMINAL HYSTERECTOMY    .  CATARACT EXTRACTION W/ INTRAOCULAR LENS  IMPLANT, BILATERAL Bilateral ~ 2015  . CHOLECYSTECTOMY OPEN  ?1980s  . CLEFT PALATE REPAIR  1949  . INNER EAR SURGERY Bilateral   . JOINT REPLACEMENT    . REPAIR OF PERFORATED ULCER  ?1980s   "duodenal"  . THYROIDECTOMY  2000s  . TONSILLECTOMY AND ADENOIDECTOMY  1950s  . TOTAL HIP ARTHROPLASTY Right 12/16/2015  . TOTAL HIP ARTHROPLASTY Right 12/16/2015   Procedure: RIGHT TOTAL HIP ARTHROPLASTY ANTERIOR APPROACH;  Surgeon: Tarry KosNaiping M Xu, MD;  Location: [facility] OR;  Service: Orthopedics;  Laterality: Right;  . TUBAL LIGATION     Family History  Problem Relation Age of Onset  . Heart disease Mother   . Clotting disorder Mother   . Cancer Mother   . Allergies Sister       X2  . Osteoporosis Maternal Grandmother    Social History   Social History  . Marital status: Married    Spouse name: N/A  . Number of children: N/A  . Years of education: N/A   Social History Main Topics  . Smoking status: Passive Smoke Exposure - Never Smoker  . Smokeless tobacco: Never Used     Comment: EXPOSED TO SECOND HAND SMOKE AS CHILD  . Alcohol use No  . Drug use: No  . Sexual activity: Yes    Birth control/ protection: Condom   Other Topics Concern  . None   Social History Narrative  . None    [facility] 04/20/2016 01/16/2016 10/31/2015 05/08/2015 04/02/2015  Decreased Interest 1 0 0 0 0  Down, Depressed, Hopeless 1 0 0 0 0  PHQ - 2 Score 2 0 0 0 0    Review of Systems  Constitutional: Positive for activity change. Negative for chills, fever and unexpected weight change.  Respiratory: Positive for chest tightness.   Cardiovascular: Positive for chest pain and leg swelling. Negative for palpitations.  Genitourinary: Positive for decreased urine volume and hematuria.  Musculoskeletal: Positive for arthralgias, back pain, gait problem, joint swelling and myalgias.  Skin: Positive for color change, rash and wound.  Hematological: Bruises/bleeds easily.  Psychiatric/Behavioral: Positive for agitation, behavioral problems, confusion, decreased concentration, dysphoric mood, self-injury and sleep disturbance. Negative for hallucinations and suicidal ideas. The patient is hyperactive. The patient is not nervous/anxious.    BP (!) 160/80 (BP Location: Right Arm, Cuff Size: Normal)   Pulse (!) 105   Temp 98 F (36.7 C) (Oral)   Ht 5' 0.5" (1.537 m)   Wt 105 lb (47.6 kg)   SpO2 95%   BMI 20.17 kg/m      Objective:   Physical Exam  Constitutional: She is oriented to person, place, and time. She appears well-developed and well-nourished. No distress.  HENT:  Head: Normocephalic and atraumatic.  Eyes: Conjunctivae and EOM are normal.  Neck:  Neck supple. No tracheal deviation present.  Cardiovascular: Regular rhythm, S1 normal and S2 normal.  Tachycardia present.   Murmur heard.  Systolic murmur is present with a grade of 2/6  2/6 systolic ejection murmur  Pulmonary/Chest: Effort normal and breath sounds normal. No respiratory distress.  Musculoskeletal: Normal range of motion. She exhibits edema.  L foot grossly deformed with the MTP joints deviated lateraly. The skin shiny, tight, erythema.  2+ pitting edema.   Neurological: She is alert and oriented to person, place, and time.  Skin: Skin is warm and dry. Rash noted.  Psychiatric: Her affect is blunt. Her speech is rapid and/or pressured.  She is agitated. She is not withdrawn, not actively hallucinating and not combative. Thought content is delusional. Thought content is not paranoid. Cognition and memory are impaired. She expresses inappropriate judgment. She exhibits abnormal recent memory.  Nursing note and vitals reviewed.  EKG: tachycardic, mild ST depression and q waves throughout  Results for orders placed or performed in visit on 04/20/16  POCT urinalysis dipstick  Result Value Ref Range   Color, UA yellow yellow   Clarity, UA clear clear   Glucose, UA negative negative   Bilirubin, UA negative negative   Ketones, POC UA negative negative   Spec Grav, UA 1.020    Blood, UA negative negative   pH, UA 5.5    Protein Ur, POC negative negative   Urobilinogen, UA 0.2    Nitrite, UA Negative Negative   Leukocytes, UA Negative Negative  POCT Microscopic Urinalysis ([facility])  Result Value Ref Range   WBC,UR,HPF,POC None None WBC/hpf   RBC,UR,HPF,POC Few (A) None RBC/hpf   Bacteria Few (A) None, Too numerous to count   Mucus Absent Absent   Epithelial Cells, UR Per Microscopy None None, Too numerous to count cells/hpf  POCT CBC  Result Value Ref Range   WBC 9.4 4.6 - 10.2 K/uL   Lymph, poc 2.1 0.6 - 3.4   POC LYMPH PERCENT 22.1 10 - 50 %L   MID (cbc) 0.7 0 - 0.9    POC MID % 7.4 0 - 12 %M   POC Granulocyte 6.6 2 - 6.9   Granulocyte percent 70.5 37 - 80 %G   RBC 4.48 4.04 - 5.48 M/uL   Hemoglobin 13.4 12.2 - 16.2 g/dL   HCT, POC 16.1 09.6 - 47.9 %   MCV 86.0 80 - 97 fL   MCH, POC 30.0 27 - 31.2 pg   MCHC 34.9 31.8 - 35.4 g/dL   RDW, POC 04.5 %   Platelet Count, POC 317 142 - 424 K/uL   MPV 6.9 0 - 99.8 fL  POCT glucose (manual entry)  Result Value Ref Range   POC Glucose 120 (A) 70 - 99 mg/dl   Dg Lumbar Spine 2-3 Views  Result Date: 04/20/2016 CLINICAL DATA:  Pain following fall EXAM: LUMBAR SPINE - 2-3 VIEW COMPARISON:  None. FINDINGS: Frontal, lateral, and spot lumbosacral lateral images were obtained. There are 5 non-rib-bearing lumbar type vertebral bodies. There is thoracolumbar levoscoliosis. No fracture or spondylolisthesis is evident. There is moderately severe disc space narrowing at all levels in the lumbar region. No erosive change. There is atherosclerotic calcification in the aorta. IMPRESSION: Multifocal osteoarthritic change. Scoliosis. No fracture or spondylolisthesis. There is aortic atherosclerosis. Electronically Signed   By: Bretta Bang III M.D.   On: 04/20/2016 11:42   Dg Foot Complete Left  Result Date: 04/20/2016 CLINICAL DATA:  Fall. EXAM: LEFT FOOT - COMPLETE 3+ VIEW COMPARISON:  01/08/2014. FINDINGS: Surgical can noted in the distal left first metatarsal. Diffuse degenerative change. Diffuse osteopenia. An acute fracture of the distal portion of the proximal phalanx of the left second toe is noted. Old healed fracture of the proximal phalanx of the left great toe. IMPRESSION: 1. Acute fracture of the distal aspect of the proximal phalanx of the left second toe. 2. Diffuse degenerative changes. Postsurgical changes first metatarsal. Electronically Signed   By: Maisie Fus  Register   On: 04/20/2016 11:41       Assessment & Plan:  [phone] Saunders Glance - is pt's daughter  Pt is currently has acute mental  status  changes c/w delerium similar to several years prior when she was profoundly hypothyroid.  Since that time several years ago, pt has had excellent med compliance up until the past 2-4 wks - stat tsh P though pt and family think she has been taking her levothyroxine.  However, the do note that she has stopped some of her meds (like her glaucoma eye drops) in the past week as her mental change has occurred.  1. Injury of left foot including toes, initial encounter   2. Fall, initial encounter   3. Acute delirium   4. Chest pain, unspecified type    Pt has acute left lower extremity edema - concern for cellulitis/dvt.  Her foot appears quite deformed but xray shows only a fracture of the 2nd toe which is clearly not responsible for the entirety of the 2+ pitting edema to knee and foot erythema.  Pt mentioned that she has left-sided chest pain radiating to left arm but is an unreliable historian at this time but EKG does show tachycardia and signs of strain. Is clearly dehydrated and I am very concerned she could have a PE.  Orders Placed This Encounter  Procedures  . Urine culture  . DG Foot Complete Left    Standing Status:   Future    Number of Occurrences:   1    Standing Expiration Date:   04/20/2017    Order Specific Question:   Reason for Exam (SYMPTOM  OR DIAGNOSIS REQUIRED)    Answer:   left foot injury, deformity    Order Specific Question:   Preferred imaging location?    Answer:   External  . DG Lumbar Spine 2-3 Views    Standing Status:   Future    Number of Occurrences:   1    Standing Expiration Date:   04/20/2017    Order Specific Question:   Reason for Exam (SYMPTOM  OR DIAGNOSIS REQUIRED)    Answer:   left foot injury, deformity    Order Specific Question:   Preferred imaging location?    Answer:   External  . Thyroid Panel With TSH  . Comprehensive metabolic panel  . POCT urinalysis dipstick  . POCT Microscopic Urinalysis ([facility])  . POCT CBC  . POCT glucose (manual  entry)  . POCT SEDIMENTATION RATE  . EKG 12-Lead    Over 40 min spent in face-to-face evaluation of and consultation with patient and coordination of care.  Over 50% of this time was spent counseling this patient.   I personally performed the services described in this documentation, which was scribed in my presence. The recorded information has been reviewed and considered, and addended by me as needed.   Norberto SorensonEva Treyana Sturgell, M.D.  [facility]  [place] [address] [place], [place] [address] [phone] phone [phone] fax  04/20/16 12:43 PM

## 2016-04-21 ENCOUNTER — Encounter (HOSPITAL_COMMUNITY): Payer: Self-pay | Admitting: General Practice

## 2016-04-21 DIAGNOSIS — Z9889 Other specified postprocedural states: Secondary | ICD-10-CM | POA: Diagnosis not present

## 2016-04-21 DIAGNOSIS — F309 Manic episode, unspecified: Secondary | ICD-10-CM | POA: Diagnosis not present

## 2016-04-21 DIAGNOSIS — Z8489 Family history of other specified conditions: Secondary | ICD-10-CM | POA: Diagnosis not present

## 2016-04-21 DIAGNOSIS — I10 Essential (primary) hypertension: Secondary | ICD-10-CM | POA: Diagnosis not present

## 2016-04-21 DIAGNOSIS — Z79899 Other long term (current) drug therapy: Secondary | ICD-10-CM

## 2016-04-21 DIAGNOSIS — R079 Chest pain, unspecified: Secondary | ICD-10-CM

## 2016-04-21 DIAGNOSIS — R4182 Altered mental status, unspecified: Secondary | ICD-10-CM | POA: Diagnosis not present

## 2016-04-21 DIAGNOSIS — G9340 Encephalopathy, unspecified: Secondary | ICD-10-CM | POA: Diagnosis not present

## 2016-04-21 DIAGNOSIS — Z8249 Family history of ischemic heart disease and other diseases of the circulatory system: Secondary | ICD-10-CM | POA: Diagnosis not present

## 2016-04-21 DIAGNOSIS — Z888 Allergy status to other drugs, medicaments and biological substances status: Secondary | ICD-10-CM

## 2016-04-21 LAB — COMPREHENSIVE METABOLIC PANEL
ALBUMIN: 3.3 g/dL — AB (ref 3.5–5.0)
ALK PHOS: 109 U/L (ref 38–126)
ALT: 22 U/L (ref 14–54)
ANION GAP: 8 (ref 5–15)
AST: 31 U/L (ref 15–41)
BUN: 5 mg/dL — ABNORMAL LOW (ref 6–20)
CHLORIDE: 110 mmol/L (ref 101–111)
CO2: 22 mmol/L (ref 22–32)
Calcium: 8.9 mg/dL (ref 8.9–10.3)
Creatinine, Ser: 0.51 mg/dL (ref 0.44–1.00)
GFR calc non Af Amer: >60 mL/min (ref >60)
GLUCOSE: 89 mg/dL (ref 65–99)
POTASSIUM: 3.5 mmol/L (ref 3.5–5.1)
SODIUM: 140 mmol/L (ref 135–145)
Total Bilirubin: 0.7 mg/dL (ref 0.3–1.2)
Total Protein: 6.4 g/dL — ABNORMAL LOW (ref 6.5–8.1)

## 2016-04-21 LAB — CBC
HEMATOCRIT: 35.9 % — AB (ref 36.0–46.0)
HEMOGLOBIN: 12 g/dL (ref 12.0–15.0)
MCH: 29.2 pg (ref 26.0–34.0)
MCHC: 33.4 g/dL (ref 30.0–36.0)
MCV: 87.3 fL (ref 78.0–100.0)
Platelets: 319 10*3/uL (ref 150–400)
RBC: 4.11 MIL/uL (ref 3.87–5.11)
RDW: 14.8 % (ref 11.5–15.5)
WBC: 9.3 10*3/uL (ref 4.0–10.5)

## 2016-04-21 LAB — URINALYSIS, ROUTINE W REFLEX MICROSCOPIC
Bilirubin Urine: NEGATIVE
GLUCOSE, UA: NEGATIVE mg/dL
Hgb urine dipstick: NEGATIVE
KETONES UR: 15 mg/dL — AB
LEUKOCYTES UA: NEGATIVE
NITRITE: NEGATIVE
PH: 6 (ref 5.0–8.0)
PROTEIN: NEGATIVE mg/dL
Specific Gravity, Urine: 1.013 (ref 1.005–1.030)

## 2016-04-21 LAB — URINE CULTURE: ORGANISM ID, BACTERIA: NO GROWTH

## 2016-04-21 LAB — RAPID URINE DRUG SCREEN, HOSP PERFORMED
Amphetamines: NOT DETECTED
Barbiturates: NOT DETECTED
Benzodiazepines: NOT DETECTED
COCAINE: NOT DETECTED
OPIATES: NOT DETECTED
TETRAHYDROCANNABINOL: NOT DETECTED

## 2016-04-21 LAB — IRON AND TIBC
IRON: 89 ug/dL (ref 28–170)
Saturation Ratios: 26 % (ref 10.4–31.8)
TIBC: 349 ug/dL (ref 250–450)
UIBC: 260 ug/dL

## 2016-04-21 LAB — RPR: RPR Ser Ql: NONREACTIVE

## 2016-04-21 LAB — VITAMIN B12: Vitamin B-12: 422 pg/mL (ref 180–914)

## 2016-04-21 LAB — TROPONIN I: Troponin I: <0.03 ng/mL (ref <0.03)

## 2016-04-21 LAB — PHOSPHORUS: PHOSPHORUS: 3.3 mg/dL (ref 2.5–4.6)

## 2016-04-21 LAB — RETICULOCYTES
RBC.: 4.11 MIL/uL (ref 3.87–5.11)
RETIC COUNT ABSOLUTE: 32.9 10*3/uL (ref 19.0–186.0)
Retic Ct Pct: 0.8 % (ref 0.4–3.1)

## 2016-04-21 LAB — TSH: TSH: 1.727 u[IU]/mL (ref 0.350–4.500)

## 2016-04-21 LAB — MAGNESIUM: MAGNESIUM: 1.8 mg/dL (ref 1.7–2.4)

## 2016-04-21 LAB — FERRITIN: FERRITIN: 71 ng/mL (ref 11–307)

## 2016-04-21 LAB — FOLATE: FOLATE: 35.2 ng/mL (ref >5.9)

## 2016-04-21 MED ORDER — OXYBUTYNIN CHLORIDE 5 MG PO TABS
5.0000 mg | ORAL_TABLET | Freq: Two times a day (BID) | ORAL | Status: DC
  Administered 2016-04-21 – 2016-04-22 (×2): 5 mg via ORAL
  Filled 2016-04-21 (×2): qty 1

## 2016-04-21 MED ORDER — QUETIAPINE FUMARATE 25 MG PO TABS
12.5000 mg | ORAL_TABLET | Freq: Every day | ORAL | Status: DC
  Administered 2016-04-21: 12.5 mg via ORAL
  Filled 2016-04-21: qty 1

## 2016-04-21 MED ORDER — POTASSIUM CHLORIDE CRYS ER 20 MEQ PO TBCR
40.0000 meq | EXTENDED_RELEASE_TABLET | Freq: Once | ORAL | Status: AC
  Administered 2016-04-21: 40 meq via ORAL
  Filled 2016-04-21: qty 2

## 2016-04-21 NOTE — Care Management Obs Status (Signed)
MEDICARE OBSERVATION STATUS NOTIFICATION   Patient Details  Name: Cheryl [name] MRN: [id number] Date of Birth: 03/23/1946   Medicare Observation Status Notification Given:       Epifanio LeschesCole, Dequann Vandervelden Hudson, RN 04/21/2016, 1:55 PM

## 2016-04-21 NOTE — Consult Note (Signed)
[facility] Consult   Reason for Consult:  Pressured speech Referring Physician:  [facility] Patient Identification: Cheryl [name] MRN:  [id number] Principal Diagnosis: Altered mental status with some pressured speech.  Diagnosis:   Patient Active Problem List   Diagnosis Date Noted  . Altered mental status [R41.82] 04/20/2016    Priority: High  . Acute encephalopathy [G93.40] 04/20/2016  . Manic state ([place]) [F30.9] 04/20/2016  . Abnormal heart rhythm [I49.9] 01/16/2016  . Hip joint replacement status [Z96.649] 12/16/2015  . Prediabetes [R73.03] 04/02/2015  . Osteoporosis [M81.0]   . Mood disorder in conditions classified elsewhere [F06.30] 02/05/2013  . Severe hypothyroidism [E03.8] 01/24/2013  . 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 Hyperlipidemia [E78.5] 01/24/2013  . Abnormal LFTs [R79.89] 03/10/2011  . 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 BP (high blood pressure) [I10] 03/04/2011  . Adult hypothyroidism [E03.9] 03/04/2011  . Atrial enlargement, left [I51.7] 03/04/2011  . Left ventricular hypertrophy [I51.7] 03/04/2011  . Billowing mitral valve [I34.1] 03/04/2011  . Pulmonary hypertension [I27.20] 03/04/2011  . Pain in right hip [M25.551] 03/04/2011  . Disease of supporting structures of teeth [K08.9] 10/28/2010  . OP (osteoporosis) [M81.0] 09/01/2010  . Abnormal blood sugar [R73.09] 09/01/2010  . HLD (hyperlipidemia) [E78.5] 09/01/2010  . Essential (primary) hypertension [I10] 09/01/2010  . Avitaminosis D [E55.9] 09/01/2010  . Bipolar I disorder, single manic episode ([place]) [F30.9] 04/21/2010  . Difficulty hearing [H91.90] 08/17/2009  . Disorder of mitral valve [I05.9] 05/22/2009  . CANDIDIASIS, ORAL [B37.0] 03/06/2009  . Essential hypertension [I10] 05/09/2008  . ALLERGIC RHINITIS [J30.9] 05/09/2008  . Asthma [J45.909] 05/09/2008    Total Time spent with patient: 40 minutes  Subjective:   Cheryl [name]  is a 70 y.o. female patient admitted with reports of pressured speech and alteration of personality due to unknown cause. Pt seen and chart reviewed. Pt is alert/oriented x4, pressured speech, cooperative, and appropriate to situation. Pt denies suicidal/homicidal ideation and psychosis and does not appear to be responding to internal stimuli. Pt is circumstantial although not tangential and is easily directed to focus on assessment questions, answering all questions appropriately. Her daughter Cheryl [name] is present for the evaluation with pt's permission. Pt and daughter report that she has had an increase in anxiety since Sunday or Monday this week. Daughter reports "pt was absolutely not awake for 3 days". No hx of bipolar or mania.  No clear organic etiology at this point in time, labs ane EKG are good, Head CT unremarkable.  HPI: Modified from hospitalist HPI: Cheryl [name] is a 70 y.o. female with medical history significant of depression manic episode which felt to be secondary to hyperthyroidism PUD , right hip replacement, HTN, hypothyroidism,  emphysema. Presented with pressured speech and inability to sleep for past 3 days urine has been dark patient has fallen 1 week ago and has swelling of her left second toe. Patient endorses trouble sleeping. Family feels that she has not been herself for past 3 days. No new medications no change in her medications. Patient does not drink or use any substances. She lives with her husband. Patient endorsed chest pain has been on and off for past one month but unable to provide any details. Examination has been endorsing mild left chest pain radiates to her left arm for past month has been intermittent. Her daughter states that 3 years ago she's been admitted for similar episode which brought to be related to her thyroid disease Patient have  had left leg edema unsure for how long. Says the same leg that has swollen toe. He was seen today by her primary care provider  who sent her into much department to be evaluated for PE.  Today on 04/21/16, pt seen by psychiatry as above. There was some concern for mania vs. Hypomanic traits vs. Agitation. Evaluated as above. Med recommendations as below.  Past Psychiatric History: anxiety during thyroid storm years ago  Risk to Self: Is patient at risk for suicide?: No Risk to Others:   Prior Inpatient Therapy:   Prior Outpatient Therapy:    Past Medical History:  Past Medical History:  Diagnosis Date  . Abnormal heart rhythm   . Allergic rhinitis   . Arthritis    "right hip" (12/17/2015)  . Asthma   . Cardiomegaly   . Duodenal ulcer   . Emphysema   . Fall 03/2016  . Family history of anesthesia complication    TWIN SISTER HAS DELAYED REACTION -with "caine" meds  . Heart murmur   . Hyperlipidemia   . Hypertension   . Hypothyroidism    SEVERE   . Mitral valve regurgitation   . Osteoporosis   . Partial deafness   . Pneumonia   . Shortness of breath     Past Surgical History:  Procedure Laterality Date  . ABDOMINAL HYSTERECTOMY    . CATARACT EXTRACTION W/ INTRAOCULAR LENS  IMPLANT, BILATERAL Bilateral ~ 2015  . CHOLECYSTECTOMY OPEN  ?1980s  . CLEFT PALATE REPAIR  1949  . INNER EAR SURGERY Bilateral   . JOINT REPLACEMENT    . REPAIR OF PERFORATED ULCER  ?1980s   "duodenal"  . THYROIDECTOMY  2000s  . TONSILLECTOMY AND ADENOIDECTOMY  1950s  . TOTAL HIP ARTHROPLASTY Right 12/16/2015  . TOTAL HIP ARTHROPLASTY Right 12/16/2015   Procedure: RIGHT TOTAL HIP ARTHROPLASTY ANTERIOR APPROACH;  Surgeon: Leandrew Koyanagi, MD;  Location: [facility];  Service: Orthopedics;  Laterality: Right;  . TUBAL LIGATION     Family History:  Family History  Problem Relation Age of Onset  . Heart disease Mother   . Clotting disorder Mother   . Cancer Mother   . Allergies Sister     X2  . Osteoporosis Maternal Grandmother    Family Psychiatric  History: denies Social History:  History  Alcohol Use No     History  Drug  Use No    Social History   Social History  . Marital status: Married    Spouse name: N/A  . Number of children: N/A  . Years of education: N/A   Social History Main Topics  . Smoking status: Passive Smoke Exposure - Never Smoker  . Smokeless tobacco: Never Used     Comment: EXPOSED TO SECOND HAND SMOKE AS CHILD  . Alcohol use No  . Drug use: No  . Sexual activity: Yes    Birth control/ protection: Condom   Other Topics Concern  . None   Social History Narrative  . None   Additional Social History:    Allergies:   Allergies  Allergen Reactions  . Aspirin     REACTION: asthma flare  . Codeine     REACTION: ulcer  . Orange Juice [Orange Oil]     Gi upset    Labs:  Results for orders placed or performed during the hospital encounter of 04/20/16 (from the past 48 hour(s))  Basic metabolic panel     Status: Abnormal   Collection Time: 04/20/16  1:35 PM  Result Value Ref Range   Sodium 138 135 - 145 mmol/L   Potassium 3.7 3.5 - 5.1 mmol/L   Chloride 108 101 - 111 mmol/L   CO2 22 22 - 32 mmol/L   Glucose, Bld 136 (H) 65 - 99 mg/dL   BUN 9 6 - 20 mg/dL   Creatinine, Ser 0.57 0.44 - 1.00 mg/dL   Calcium 9.8 8.9 - 10.3 mg/dL   GFR calc non Af Amer >60 >60 mL/min   GFR calc Af Amer >60 >60 mL/min    Comment: (NOTE) The eGFR has been calculated using the CKD EPI equation. This calculation has not been validated in all clinical situations. eGFR's persistently <60 mL/min signify possible Chronic Kidney Disease.    Anion gap 8 5 - 15  CBC     Status: None   Collection Time: 04/20/16  1:35 PM  Result Value Ref Range   WBC 9.8 4.0 - 10.5 K/uL   RBC 4.48 3.87 - 5.11 MIL/uL   Hemoglobin 13.0 12.0 - 15.0 g/dL   HCT 39.3 36.0 - 46.0 %   MCV 87.7 78.0 - 100.0 fL   MCH 29.0 26.0 - 34.0 pg   MCHC 33.1 30.0 - 36.0 g/dL   RDW 14.8 11.5 - 15.5 %   Platelets 346 150 - 400 K/uL  I-stat troponin, ED     Status: None   Collection Time: 04/20/16  2:07 PM  Result Value Ref  Range   Troponin i, poc 0.01 0.00 - 0.08 ng/mL   Comment 3            Comment: Due to the release kinetics of cTnI, a negative result within the first hours of the onset of symptoms does not rule out myocardial infarction with certainty. If myocardial infarction is still suspected, repeat the test at appropriate intervals.   Brain natriuretic peptide     Status: None   Collection Time: 04/20/16  5:10 PM  Result Value Ref Range   B Natriuretic Peptide 98.6 0.0 - 100.0 pg/mL  Protime-INR     Status: None   Collection Time: 04/20/16  5:10 PM  Result Value Ref Range   Prothrombin Time 12.1 11.4 - 15.2 seconds   INR 0.90   TSH     Status: None   Collection Time: 04/20/16  5:10 PM  Result Value Ref Range   TSH 2.352 0.350 - 4.500 uIU/mL    Comment: Performed by a 3rd Generation assay with a functional sensitivity of <=0.01 uIU/mL.  T4, free     Status: Abnormal   Collection Time: 04/20/16  5:10 PM  Result Value Ref Range   Free T4 1.14 (H) 0.61 - 1.12 ng/dL    Comment: (NOTE) Biotin ingestion may interfere with free T4 tests. If the results are inconsistent with the TSH level, previous test results, or the clinical presentation, then consider biotin interference. If needed, order repeat testing after stopping biotin.   Ammonia     Status: None   Collection Time: 04/20/16  5:12 PM  Result Value Ref Range   Ammonia 17 9 - 35 umol/L  I-stat troponin, ED     Status: None   Collection Time: 04/20/16  6:24 PM  Result Value Ref Range   Troponin i, poc 0.01 0.00 - 0.08 ng/mL   Comment 3            Comment: Due to the release kinetics of cTnI, a negative result within the first hours of the onset of  symptoms does not rule out myocardial infarction with certainty. If myocardial infarction is still suspected, repeat the test at appropriate intervals.   Urinalysis, Routine w reflex microscopic (not at [facility])     Status: Abnormal   Collection Time: 04/20/16  7:33 PM  Result Value Ref  Range   Color, Urine YELLOW YELLOW   APPearance CLEAR CLEAR   Specific Gravity, Urine 1.007 1.005 - 1.030   pH 6.0 5.0 - 8.0   Glucose, UA NEGATIVE NEGATIVE mg/dL   Hgb urine dipstick TRACE (A) NEGATIVE   Bilirubin Urine NEGATIVE NEGATIVE   Ketones, ur NEGATIVE NEGATIVE mg/dL   Protein, ur NEGATIVE NEGATIVE mg/dL   Nitrite NEGATIVE NEGATIVE   Leukocytes, UA NEGATIVE NEGATIVE  Urine microscopic-add on     Status: Abnormal   Collection Time: 04/20/16  7:33 PM  Result Value Ref Range   Squamous Epithelial / LPF 0-5 (A) NONE SEEN   WBC, UA 0-5 0 - 5 WBC/hpf   RBC / HPF 0-5 0 - 5 RBC/hpf   Bacteria, UA NONE SEEN NONE SEEN  D-dimer, quantitative (not at [facility])     Status: Abnormal   Collection Time: 04/20/16  9:34 PM  Result Value Ref Range   D-Dimer, Quant 1.57 (H) 0.00 - 0.50 ug/mL-FEU    Comment: (NOTE) At the manufacturer cut-off of 0.50 ug/mL FEU, this assay has been documented to exclude PE with a sensitivity and negative predictive value of 97 to 99%.  At this time, this assay has not been approved by the FDA to exclude DVT/VTE. Results should be correlated with clinical presentation.   Ethanol     Status: None   Collection Time: 04/20/16 10:45 PM  Result Value Ref Range   Alcohol, Ethyl (B) <5 <5 mg/dL    Comment:        LOWEST DETECTABLE LIMIT FOR SERUM ALCOHOL IS 5 mg/dL FOR MEDICAL PURPOSES ONLY   Troponin I     Status: None   Collection Time: 04/20/16 10:45 PM  Result Value Ref Range   Troponin I <0.03 <0.03 ng/mL  Magnesium     Status: None   Collection Time: 04/21/16  6:09 AM  Result Value Ref Range   Magnesium 1.8 1.7 - 2.4 mg/dL  Phosphorus     Status: None   Collection Time: 04/21/16  6:09 AM  Result Value Ref Range   Phosphorus 3.3 2.5 - 4.6 mg/dL  TSH     Status: None   Collection Time: 04/21/16  6:09 AM  Result Value Ref Range   TSH 1.727 0.350 - 4.500 uIU/mL    Comment: Performed by a 3rd Generation assay with a functional sensitivity of <=0.01  uIU/mL.  Comprehensive metabolic panel     Status: Abnormal   Collection Time: 04/21/16  6:09 AM  Result Value Ref Range   Sodium 140 135 - 145 mmol/L   Potassium 3.5 3.5 - 5.1 mmol/L   Chloride 110 101 - 111 mmol/L   CO2 22 22 - 32 mmol/L   Glucose, Bld 89 65 - 99 mg/dL   BUN 5 (L) 6 - 20 mg/dL   Creatinine, Ser 0.51 0.44 - 1.00 mg/dL   Calcium 8.9 8.9 - 10.3 mg/dL   Total Protein 6.4 (L) 6.5 - 8.1 g/dL   Albumin 3.3 (L) 3.5 - 5.0 g/dL   AST 31 15 - 41 U/L   ALT 22 14 - 54 U/L   Alkaline Phosphatase 109 38 - 126 U/L   Total Bilirubin 0.7  0.3 - 1.2 mg/dL   GFR calc non Af Amer >60 >60 mL/min   GFR calc Af Amer >60 >60 mL/min    Comment: (NOTE) The eGFR has been calculated using the CKD EPI equation. This calculation has not been validated in all clinical situations. eGFR's persistently <60 mL/min signify possible Chronic Kidney Disease.    Anion gap 8 5 - 15  CBC     Status: Abnormal   Collection Time: 04/21/16  6:09 AM  Result Value Ref Range   WBC 9.3 4.0 - 10.5 K/uL   RBC 4.11 3.87 - 5.11 MIL/uL   Hemoglobin 12.0 12.0 - 15.0 g/dL   HCT 35.9 (L) 36.0 - 46.0 %   MCV 87.3 78.0 - 100.0 fL   MCH 29.2 26.0 - 34.0 pg   MCHC 33.4 30.0 - 36.0 g/dL   RDW 14.8 11.5 - 15.5 %   Platelets 319 150 - 400 K/uL  Troponin I     Status: None   Collection Time: 04/21/16  6:09 AM  Result Value Ref Range   Troponin I <0.03 <0.03 ng/mL  Vitamin B12     Status: None   Collection Time: 04/21/16  6:09 AM  Result Value Ref Range   Vitamin B-12 422 180 - 914 pg/mL    Comment: (NOTE) This assay is not validated for testing neonatal or myeloproliferative syndrome specimens for Vitamin B12 levels.   Folate     Status: None   Collection Time: 04/21/16  6:09 AM  Result Value Ref Range   Folate 35.2 >5.9 ng/mL  Iron and TIBC     Status: None   Collection Time: 04/21/16  6:09 AM  Result Value Ref Range   Iron 89 28 - 170 ug/dL   TIBC 349 250 - 450 ug/dL   Saturation Ratios 26 10.4 - 31.8 %    UIBC 260 ug/dL  Ferritin     Status: None   Collection Time: 04/21/16  6:09 AM  Result Value Ref Range   Ferritin 71 11 - 307 ng/mL  Reticulocytes     Status: None   Collection Time: 04/21/16  6:09 AM  Result Value Ref Range   Retic Ct Pct 0.8 0.4 - 3.1 %   RBC. 4.11 3.87 - 5.11 MIL/uL   Retic Count, Manual 32.9 19.0 - 186.0 K/uL    Current Facility-Administered Medications  Medication Dose Route Frequency Provider Last Rate Last Dose  . acetaminophen (TYLENOL) tablet 650 mg  650 mg Oral Q6H PRN Toy Baker, MD   650 mg at 04/21/16 0847   Or  . acetaminophen (TYLENOL) suppository 650 mg  650 mg Rectal Q6H PRN Toy Baker, MD      . albuterol (PROVENTIL) (2.5 MG/3ML) 0.083% nebulizer solution 2.5 mg  2.5 mg Inhalation Q4H PRN Toy Baker, MD      . amLODipine (NORVASC) tablet 5 mg  5 mg Oral Daily Toy Baker, MD   5 mg at 04/21/16 0847  . doxycycline (VIBRA-TABS) tablet 100 mg  100 mg Oral Q12H Toy Baker, MD   100 mg at 04/21/16 0846  . iopamidol (ISOVUE-300) 61 % injection           . latanoprost (XALATAN) 0.005 % ophthalmic solution 1 drop  1 drop Both Eyes QHS Toy Baker, MD   1 drop at 04/21/16 0043  . levothyroxine (SYNTHROID, LEVOTHROID) tablet 88 mcg  88 mcg Oral QAC breakfast Toy Baker, MD   88 mcg at 04/21/16 0846  . LORazepam (  ATIVAN) injection 0.5 mg  0.5 mg Intravenous Q6H PRN Toy Baker, MD      . ondansetron (ZOFRAN) tablet 4 mg  4 mg Oral Q6H PRN Toy Baker, MD       Or  . ondansetron (ZOFRAN) injection 4 mg  4 mg Intravenous Q6H PRN Toy Baker, MD      . sodium chloride flush (NS) 0.9 % injection 3 mL  3 mL Intravenous Q12H Toy Baker, MD   3 mL at 04/21/16 1000  . vancomycin (VANCOCIN) IVPB 750 mg/150 ml premix  750 mg Intravenous Q24H Courteney Lyn Mackuen, MD        Musculoskeletal: Strength & Muscle Tone: decreased Gait & Station: unsteady Patient leans: N/A  Psychiatric  Specialty Exam: Physical Exam  Review of Systems  Psychiatric/Behavioral: Positive for depression (mild). Negative for hallucinations, substance abuse and suicidal ideas. The patient is nervous/anxious (mild) and has insomnia.   All other systems reviewed and are negative.   Blood pressure (!) 140/54, pulse 84, temperature 98.7 F (37.1 C), temperature source Oral, resp. rate 18, height _0  (1.651 m), weight 49 kg (108 lb 1.6 oz), SpO2 96 %.Body mass index is 17.99 kg/m.  General Appearance: Casual and Fairly Groomed  Eye Contact:  Good  Speech:  Pressured  Volume:  Normal  Mood:  Anxious  Affect:  Appropriate and Congruent  Thought Process:  Coherent, Goal Directed, Linear and Descriptions of Associations: Circumstantial  Orientation:  Full (Time, Place, and Person)  Thought Content:  Treatment plan, medications, fully aware of treatment, family, providers, staff, medications, no hallucinations, no delusions  Suicidal Thoughts:  No  Homicidal Thoughts:  No  Memory:  Immediate;   Fair Recent;   Fair Remote;   Fair  Judgement:  Fair  Insight:  Good  Psychomotor Activity:  Increased  Concentration:  Concentration: Fair and Attention Span: Fair  Recall:  Good  Fund of Knowledge:  Good  Language:  Good  Akathisia:  No  Handed:    AIMS (if indicated):     Assets:  Communication Skills Desire for Improvement Financial Resources/Insurance Housing Intimacy Leisure Time Resilience Social Support Talents/Skills Vocational/Educational  ADL's:  Requires mild assistance due to unsteady gait  Cognition:  WNL  Sleep:      Treatment Plan Summary: Altered mental status yet improving, with some pressures speech and anxiety:  Medications: -Discontinue Ativan (fall risk and may worsen condition) -Start Seroquel 12.46m po qhs for mood stabilization  Disposition: No evidence of imminent risk to self or others at present.   Supportive therapy provided about ongoing stressors. Will  see again tomorrow to manage medications (WILL CONSULT AGAIN)  WBenjamine Mola FNP 04/21/2016 11:03 AM

## 2016-04-21 NOTE — Progress Notes (Signed)
PROGRESS NOTE    Cheryl [name]  RUE:[id number] DOB: 01/31/1946 DOA: 04/20/2016 PCP: Norberto SorensonSHAW,EVA, MD    Brief Narrative:  70 y.o. female with medical history significant of depression manic episode which felt to be secondary to hyperthyroidism PUD , Right hip replacement, HTN, hypothyroidism,  emphysema being admitted for manic episode unclear etiology    Assessment & Plan:   Principal Problem:   Altered mental status Active Problems:   Essential hypertension   Essential (primary) hypertension   Acute encephalopathy   Manic state (HCC)   Chest pain  #1 manic state/acute encephalopathy Patient noted to have pressured speech and tangential. Infectious workup negative to date. Patient with no focal neurological deficits. Head CT negative. Will repeat urinalysis as patient complaining of urinary frequency. Psychiatric consultation for further evaluation and management.   #2 hypertension Stable.  #3 hypothyroidism Continue Synthroid.  #4 chest pain Questionable etiology. Nonreproducible. Patient did have elevated d-dimer and a such CT angiogram chest was done was negative. Lower extremity Dopplers negative for DVT. Cardiac enzymes negative. Check a 2-D echo. Follow.  #5 left toe fracture Will need outpatient follow-up with orthopedics.  #6 ?? Cellulitis Patient was on IV vancomycin on admission which was subsequently transitioned to oral doxycycline. Clinical improvement.    DVT prophylaxis: SCDs Code Status: Full Family Communication: Updated patient. Updated and daughter via telephone. Disposition Plan: Likely home versus inpatient psych. Pending PT evaluation, and psychiatric evaluation.   Consultants:   Psychiatry 04/21/2016  Procedures:   CT head without contrast 04/20/2016  CT angiogram chest 04/21/2016  Chest x-ray 04/20/2016  X-ray of the left foot 04/20/2016  X-ray of the L-spine 04/20/2016  Lower extremity Dopplers 04/20/2016  Antimicrobials:    Doxycycline 04/21/2016   Subjective: Patient with pressured speech. No chest pain. No shortness of breath. Patient complaining of urinary frequency. Patient with some complaints of intermittent chest pain and left upper extremity pain ongoing for the past 2 months.  Objective: Vitals:   04/20/16 2200 04/20/16 2239 04/21/16 0449 04/21/16 1406  BP: 135/98 (!) 148/54 (!) 140/54 (!) 156/76  Pulse: 76 72 84 84  Resp: 20 18 18 16   Temp:  97.6 F (36.4 C) 98.7 F (37.1 C) 98.2 F (36.8 C)  TempSrc:  Oral Oral Oral  SpO2: 96% 96% 96% 96%  Weight:  49 kg (108 lb 1.6 oz)    Height:  5\' 5"  (1.651 m)      Intake/Output Summary (Last 24 hours) at 04/21/16 2013 Last data filed at 04/21/16 1206  Gross per 24 hour  Intake           1297.5 ml  Output             1900 ml  Net           -602.5 ml   Filed Weights   04/20/16 1322 04/20/16 2239  Weight: 47.6 kg (105 lb) 49 kg (108 lb 1.6 oz)    Examination:  General exam: Appears calm and comfortable  Respiratory system: Clear to auscultation. Respiratory effort normal. Cardiovascular system: S1 & S2 heard, RRR. No JVD, murmurs, rubs, gallops or clicks. No pedal edema. Gastrointestinal system: Abdomen is nondistended, soft and nontender. No organomegaly or masses felt. Normal bowel sounds heard. Central nervous system: Alert and oriented. No focal neurological deficits. Extremities: Symmetric 5 x 5 power. Skin: No rashes, lesions or ulcers Psychiatry: Patient with pressured speech. Tangential. Judgment fair.    Data Reviewed: I have personally reviewed following labs and  imaging studies  CBC:  Recent Labs Lab 04/20/16 1149 04/20/16 1335 04/21/16 0609  WBC 9.4 9.8 9.3  HGB 13.4 13.0 12.0  HCT 38.5 39.3 35.9*  MCV 86.0 87.7 87.3  PLT  --  346 319   Basic Metabolic Panel:  Recent Labs Lab 04/20/16 1115 04/20/16 1335 04/21/16 0609  NA 140 138 140  K 3.8 3.7 3.5  CL 107 108 110  CO2 22 22 22   GLUCOSE 118* 136* 89   BUN 13 9 5*  CREATININE 0.62 0.57 0.51  CALCIUM 9.7 9.8 8.9  MG  --   --  1.8  PHOS  --   --  3.3   GFR: Estimated Creatinine Clearance: 49.9 mL/min (by C-G formula based on SCr of 0.51 mg/dL). Liver Function Tests:  Recent Labs Lab 04/20/16 1115 04/21/16 0609  AST 30 31  ALT 22 22  ALKPHOS 119 109  BILITOT 0.3 0.7  PROT 7.1 6.4*  ALBUMIN 4.0 3.3*   No results for input(s): LIPASE, AMYLASE in the last 168 hours.  Recent Labs Lab 04/20/16 1712  AMMONIA 17   Coagulation Profile:  Recent Labs Lab 04/20/16 1710  INR 0.90   Cardiac Enzymes:  Recent Labs Lab 04/20/16 2245 04/21/16 0609 04/21/16 1119  TROPONINI <0.03 <0.03 <0.03   BNP (last 3 results) No results for input(s): PROBNP in the last 8760 hours. HbA1C: No results for input(s): HGBA1C in the last 72 hours. CBG: No results for input(s): GLUCAP in the last 168 hours. Lipid Profile: No results for input(s): CHOL, HDL, LDLCALC, TRIG, CHOLHDL, LDLDIRECT in the last 72 hours. Thyroid Function Tests:  Recent Labs  04/20/16 1115  04/20/16 1710 04/21/16 0609  TSH 2.71  < > 2.352 1.727  T4TOTAL 11.6  --   --   --   FREET4  --   --  1.14*  --   < > = values in this interval not displayed. Anemia Panel:  Recent Labs  04/21/16 0609  VITAMINB12 422  FOLATE 35.2  FERRITIN 71  TIBC 349  IRON 89  RETICCTPCT 0.8   Sepsis Labs: No results for input(s): PROCALCITON, LATICACIDVEN in the last 168 hours.  Recent Results (from the past 240 hour(s))  Urine culture     Status: None   Collection Time: 04/20/16 11:33 AM  Result Value Ref Range Status   Organism ID, Bacteria NO GROWTH  Final         Radiology Studies: Dg Chest 2 View  Result Date: 04/20/2016 CLINICAL DATA:  Left-sided chest pain. EXAM: CHEST  2 VIEW COMPARISON:  Two-view chest x-ray 10/31/2015 FINDINGS: The heart size is normal. Emphysematous changes are again seen. There is no edema or effusion. No focal airspace disease is  present. No significant effusions are present. The visualized soft tissues and bony thorax are unremarkable. Surgical clips are present at the gallbladder fossa. IMPRESSION: 1. No acute cardiopulmonary disease or significant interval change. 2. Negative two view chest. Electronically Signed   By: Marin Robertshristopher  Mattern M.D.   On: 04/20/2016 14:16   Dg Lumbar Spine 2-3 Views  Result Date: 04/20/2016 CLINICAL DATA:  Pain following fall EXAM: LUMBAR SPINE - 2-3 VIEW COMPARISON:  None. FINDINGS: Frontal, lateral, and spot lumbosacral lateral images were obtained. There are 5 non-rib-bearing lumbar type vertebral bodies. There is thoracolumbar levoscoliosis. No fracture or spondylolisthesis is evident. There is moderately severe disc space narrowing at all levels in the lumbar region. No erosive change. There is atherosclerotic calcification in the aorta.  IMPRESSION: Multifocal osteoarthritic change. Scoliosis. No fracture or spondylolisthesis. There is aortic atherosclerosis. Electronically Signed   By: Bretta Bang III M.D.   On: 04/20/2016 11:42   Ct Head Wo Contrast  Result Date: 04/20/2016 CLINICAL DATA:  Recent fall with increased confusion EXAM: CT HEAD WITHOUT CONTRAST TECHNIQUE: Contiguous axial images were obtained from the base of the skull through the vertex without intravenous contrast. COMPARISON:  01/22/2013 FINDINGS: Brain: No acute territorial infarction, intracranial hemorrhage or focal mass lesion is present. No extra-axial fluid collection. Moderate atrophy. Ventricles similar size and morphology compared to prior. Mild periventricular white matter hypodensity, presumed small vessel disease. Vascular: No hyperdense vessels.  Carotid artery calcifications. Skull: Heterogeneous attenuation likely related to osteopenia. Right mastoid sclerosis. No fracture. Sinuses/Orbits: Mucosal opacification of the ethmoids and right maxillary sinus. No acute orbital abnormality. Other: None IMPRESSION: No  CT evidence for acute intracranial abnormality. Electronically Signed   By: Jasmine Pang M.D.   On: 04/20/2016 18:39   Ct Angio Chest Pe W Or Wo Contrast  Result Date: 04/21/2016 CLINICAL DATA:  Chronic mild left-sided chest pain. Initial encounter. EXAM: CT ANGIOGRAPHY CHEST WITH CONTRAST TECHNIQUE: Multidetector CT imaging of the chest was performed using the standard protocol during bolus administration of intravenous contrast. Multiplanar CT image reconstructions and MIPs were obtained to evaluate the vascular anatomy. CONTRAST:  50 mL of Isovue 370 IV contrast COMPARISON:  Chest radiograph performed earlier today at 1:51 p.m. FINDINGS: Cardiovascular:  There is no evidence of pulmonary embolus. Mild biatrial enlargement is noted. The heart is otherwise grossly unremarkable. Mild calcification is noted along the aortic arch. The great vessels are unremarkable in appearance. Mediastinum/Nodes: The mediastinum is otherwise grossly unremarkable. No mediastinal lymphadenopathy is seen. No pericardial effusion is identified. The thyroid gland is not well characterized. No dominant thyroid mass is seen. No axillary lymphadenopathy is appreciated. Lungs/Pleura: Minimal bibasilar atelectasis or scarring is noted. The lungs are otherwise grossly clear. No pleural effusion or pneumothorax is seen. No masses are identified. Upper Abdomen: The visualized portions of the liver and spleen are unremarkable. The patient is status post cholecystectomy, with clips noted at the gallbladder fossa. The pancreas is difficult to fully assess. The visualized portions of the adrenal glands and left kidney are unremarkable. Musculoskeletal: No acute osseous abnormalities are identified. Multilevel vacuum phenomenon is noted along the lower thoracic and upper lumbar spine, with endplate sclerotic change and irregularity noted at L1-L2. The visualized musculature is unremarkable in appearance. Review of the MIP images confirms the  above findings. IMPRESSION: 1. No evidence of pulmonary embolus. 2. Minimal bibasilar atelectasis or scarring noted. Lungs otherwise clear. 3. Mild biatrial enlargement noted. 4. Mild degenerative change along the lower thoracic and upper lumbar spine. Electronically Signed   By: Roanna Raider M.D.   On: 04/21/2016 00:23   Dg Foot Complete Left  Result Date: 04/20/2016 CLINICAL DATA:  Fall. EXAM: LEFT FOOT - COMPLETE 3+ VIEW COMPARISON:  01/08/2014. FINDINGS: Surgical can noted in the distal left first metatarsal. Diffuse degenerative change. Diffuse osteopenia. An acute fracture of the distal portion of the proximal phalanx of the left second toe is noted. Old healed fracture of the proximal phalanx of the left great toe. IMPRESSION: 1. Acute fracture of the distal aspect of the proximal phalanx of the left second toe. 2. Diffuse degenerative changes. Postsurgical changes first metatarsal. Electronically Signed   By: Maisie Fus  Register   On: 04/20/2016 11:41        Scheduled Meds: .  amLODipine  5 mg Oral Daily  . doxycycline  100 mg Oral Q12H  . latanoprost  1 drop Both Eyes QHS  . levothyroxine  88 mcg Oral QAC breakfast  . QUEtiapine  12.5 mg Oral QHS  . sodium chloride flush  3 mL Intravenous Q12H   Continuous Infusions:    LOS: 0 days    Time spent: 35 minutes    Melroy Bougher, MD [facility] Pager [phone]   If 7PM-7AM, please contact night-coverage [email or web address] Password [facility] 04/21/2016, 8:13 PM

## 2016-04-22 ENCOUNTER — Observation Stay (HOSPITAL_BASED_OUTPATIENT_CLINIC_OR_DEPARTMENT_OTHER): Payer: BLUE CROSS/BLUE SHIELD

## 2016-04-22 DIAGNOSIS — I10 Essential (primary) hypertension: Secondary | ICD-10-CM | POA: Diagnosis not present

## 2016-04-22 DIAGNOSIS — Z9889 Other specified postprocedural states: Secondary | ICD-10-CM | POA: Diagnosis not present

## 2016-04-22 DIAGNOSIS — G9340 Encephalopathy, unspecified: Secondary | ICD-10-CM | POA: Diagnosis not present

## 2016-04-22 DIAGNOSIS — Z8249 Family history of ischemic heart disease and other diseases of the circulatory system: Secondary | ICD-10-CM | POA: Diagnosis not present

## 2016-04-22 DIAGNOSIS — R079 Chest pain, unspecified: Secondary | ICD-10-CM | POA: Diagnosis not present

## 2016-04-22 DIAGNOSIS — F309 Manic episode, unspecified: Secondary | ICD-10-CM | POA: Diagnosis not present

## 2016-04-22 DIAGNOSIS — R4182 Altered mental status, unspecified: Secondary | ICD-10-CM | POA: Diagnosis not present

## 2016-04-22 DIAGNOSIS — Z8489 Family history of other specified conditions: Secondary | ICD-10-CM | POA: Diagnosis not present

## 2016-04-22 LAB — HEMOGLOBIN A1C
Hgb A1c MFr Bld: 5.9 % — ABNORMAL HIGH (ref 4.8–5.6)
Mean Plasma Glucose: 123 mg/dL

## 2016-04-22 LAB — BASIC METABOLIC PANEL
Anion gap: 7 (ref 5–15)
BUN: 10 mg/dL (ref 6–20)
CO2: 23 mmol/L (ref 22–32)
CREATININE: 0.52 mg/dL (ref 0.44–1.00)
Calcium: 9.3 mg/dL (ref 8.9–10.3)
Chloride: 111 mmol/L (ref 101–111)
GFR calc Af Amer: >60 mL/min (ref >60)
Glucose, Bld: 88 mg/dL (ref 65–99)
Potassium: 3.7 mmol/L (ref 3.5–5.1)
SODIUM: 141 mmol/L (ref 135–145)

## 2016-04-22 LAB — ECHOCARDIOGRAM COMPLETE
Height: 65 in
Weight: 1729.6 [oz_av]

## 2016-04-22 LAB — URINE CULTURE: CULTURE: NO GROWTH

## 2016-04-22 MED ORDER — QUETIAPINE FUMARATE 25 MG PO TABS
25.0000 mg | ORAL_TABLET | Freq: Every day | ORAL | Status: DC
Start: 2016-04-22 — End: 2016-04-23
  Administered 2016-04-22: 25 mg via ORAL
  Filled 2016-04-22: qty 1

## 2016-04-22 MED ORDER — OXYBUTYNIN CHLORIDE 5 MG PO TABS
5.0000 mg | ORAL_TABLET | Freq: Three times a day (TID) | ORAL | Status: DC
  Administered 2016-04-22 – 2016-04-23 (×3): 5 mg via ORAL
  Filled 2016-04-22 (×3): qty 1

## 2016-04-22 MED ORDER — PHENOL 1.4 % MT LIQD
1.0000 | OROMUCOSAL | Status: DC | PRN
  Administered 2016-04-22: 1 via OROMUCOSAL
  Filled 2016-04-22: qty 177

## 2016-04-22 NOTE — Progress Notes (Signed)
  Echocardiogram 2D Echocardiogram has been performed.  Cheryl SavoyCasey N Lamiracle [name] 04/22/2016, 2:26 PM

## 2016-04-22 NOTE — Consult Note (Signed)
[facility] Consult   Reason for Consult:  Pressured speech Referring Physician:  [facility] Patient Identification: Cheryl [name] MRN:  [id number] Principal Diagnosis: Altered mental status with some pressured speech.  Diagnosis:   Patient Active Problem List   Diagnosis Date Noted  . Altered mental status [R41.82] 04/20/2016    Priority: High  . Chest pain [R07.9]   . Acute encephalopathy [G93.40] 04/20/2016  . Manic state ([place]) [F30.9] 04/20/2016  . Abnormal heart rhythm [I49.9] 01/16/2016  . Hip joint replacement status [Z96.649] 12/16/2015  . Prediabetes [R73.03] 04/02/2015  . Osteoporosis [M81.0]   . Mood disorder in conditions classified elsewhere [F06.30] 02/05/2013  . Severe hypothyroidism [E03.8] 01/24/2013  . 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 Hyperlipidemia [E78.5] 01/24/2013  . Abnormal LFTs [R79.89] 03/10/2011  . 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 BP (high blood pressure) [I10] 03/04/2011  . Adult hypothyroidism [E03.9] 03/04/2011  . Atrial enlargement, left [I51.7] 03/04/2011  . Left ventricular hypertrophy [I51.7] 03/04/2011  . Billowing mitral valve [I34.1] 03/04/2011  . Pulmonary hypertension [I27.20] 03/04/2011  . Pain in right hip [M25.551] 03/04/2011  . Disease of supporting structures of teeth [K08.9] 10/28/2010  . OP (osteoporosis) [M81.0] 09/01/2010  . Abnormal blood sugar [R73.09] 09/01/2010  . HLD (hyperlipidemia) [E78.5] 09/01/2010  . Essential (primary) hypertension [I10] 09/01/2010  . Avitaminosis D [E55.9] 09/01/2010  . Bipolar I disorder, single manic episode ([place]) [F30.9] 04/21/2010  . Difficulty hearing [H91.90] 08/17/2009  . Disorder of mitral valve [I05.9] 05/22/2009  . CANDIDIASIS, ORAL [B37.0] 03/06/2009  . Essential hypertension [I10] 05/09/2008  . ALLERGIC RHINITIS [J30.9] 05/09/2008  . Asthma [J45.909] 05/09/2008    Total Time spent with patient: 40  minutes  Subjective:   Cheryl [name] is a 70 y.o. female patient admitted with reports of pressured speech and alteration of personality due to unknown cause. Pt seen and chart reviewed. Pt is alert/oriented x4, pressured speech, cooperative, and appropriate to situation. Pt denies suicidal/homicidal ideation and psychosis and does not appear to be responding to internal stimuli.   Today, on 04/22/16, pt seen again for re-evaluation of medication recommendations from yesterday. Pt is improving but will benefit from mild upward titration of Seroquel as she has responded well although not close enough to baseline at this time. Will add to consult list for 04/23/16 Saturday as well to continue to monitor this treatment plan.   HPI: Modified from hospitalist HPI: Cheryl [name] is a 70 y.o. female with medical history significant of depression manic episode which felt to be secondary to hyperthyroidism PUD , right hip replacement, HTN, hypothyroidism,  emphysema. Presented with pressured speech and inability to sleep for past 3 days urine has been dark patient has fallen 1 week ago and has swelling of her left second toe. Patient endorses trouble sleeping. Family feels that she has not been herself for past 3 days. No new medications no change in her medications. Patient does not drink or use any substances. She lives with her husband. Patient endorsed chest pain has been on and off for past one month but unable to provide any details. Examination has been endorsing mild left chest pain radiates to her left arm for past month has been intermittent. Her daughter states that 3 years ago she's been admitted for similar episode which brought to be related to her thyroid disease Patient have had left leg edema unsure for how long. Says the same leg that has swollen toe. He  was seen today by her primary care provider who sent her into much department to be evaluated for PE.  Today on 04/21/16, pt seen by psychiatry  as above. There was some concern for mania vs. Hypomanic traits vs. Agitation. Evaluated as above. Med recommendations as below.  Pt is circumstantial although not tangential and is easily directed to focus on assessment questions, answering all questions appropriately. Her daughter Cheryl [name] is present for the evaluation with pt's permission. Pt and daughter report that she has had an increase in anxiety since Sunday or Monday this week. Daughter reports "pt was absolutely not awake for 3 days". No hx of bipolar or mania.  No clear organic etiology at this point in time, labs ane EKG are good, Head CT unremarkable.  Interval History 04/22/16: Pt seen again as above. Pt is responding well to Seroquel 12.68m but pressured speech still persists; it is markedly improved and pt will benefit from titration to 226mof Seroquel as she did not present with any changes in fall risk (according to staff working closely with her and based on alert/oriented strong LOC without any somnolence. See today's eval above.   Past Psychiatric History: anxiety during thyroid storm years ago  Risk to Self: Is patient at risk for suicide?: No Risk to Others:   Prior Inpatient Therapy:   Prior Outpatient Therapy:    Past Medical History:  Past Medical History:  Diagnosis Date  . Abnormal heart rhythm   . Allergic rhinitis   . Arthritis    "right hip" (12/17/2015)  . Asthma   . Cardiomegaly   . Duodenal ulcer   . Emphysema   . Fall 03/2016  . Family history of anesthesia complication    TWIN SISTER HAS DELAYED REACTION -with "caine" meds  . Heart murmur   . Hyperlipidemia   . Hypertension   . Hypothyroidism    SEVERE   . Mitral valve regurgitation   . Osteoporosis   . Partial deafness   . Pneumonia   . Shortness of breath     Past Surgical History:  Procedure Laterality Date  . ABDOMINAL HYSTERECTOMY    . CATARACT EXTRACTION W/ INTRAOCULAR LENS  IMPLANT, BILATERAL Bilateral ~ 2015  . CHOLECYSTECTOMY OPEN   ?1980s  . CLEFT PALATE REPAIR  1949  . INNER EAR SURGERY Bilateral   . JOINT REPLACEMENT    . REPAIR OF PERFORATED ULCER  ?1980s   "duodenal"  . THYROIDECTOMY  2000s  . TONSILLECTOMY AND ADENOIDECTOMY  1950s  . TOTAL HIP ARTHROPLASTY Right 12/16/2015  . TOTAL HIP ARTHROPLASTY Right 12/16/2015   Procedure: RIGHT TOTAL HIP ARTHROPLASTY ANTERIOR APPROACH;  Surgeon: NaLeandrew KoyanagiMD;  [facility] Service: Orthopedics;  Laterality: Right;  . TUBAL LIGATION     Family History:  Family History  Problem Relation Age of Onset  . Heart disease Mother   . Clotting disorder Mother   . Cancer Mother   . Allergies Sister     X2  . Osteoporosis Maternal Grandmother    Family Psychiatric  History: denies Social History:  History  Alcohol Use No     History  Drug Use No    Social History   Social History  . Marital status: Married    Spouse name: N/A  . Number of children: N/A  . Years of education: N/A   Social History Main Topics  . Smoking status: Passive Smoke Exposure - Never Smoker  . Smokeless tobacco: Never Used  Comment: EXPOSED TO SECOND HAND SMOKE AS CHILD  . Alcohol use No  . Drug use: No  . Sexual activity: Yes    Birth control/ protection: Condom   Other Topics Concern  . None   Social History Narrative  . None   Additional Social History:    Allergies:   Allergies  Allergen Reactions  . Aspirin     REACTION: asthma flare  . Codeine     REACTION: ulcer  . Orange Juice [Orange Oil]     Gi upset    Labs:  Results for orders placed or performed during the hospital encounter of 04/20/16 (from the past 48 hour(s))  Basic metabolic panel     Status: Abnormal   Collection Time: 04/20/16  1:35 PM  Result Value Ref Range   Sodium 138 135 - 145 mmol/L   Potassium 3.7 3.5 - 5.1 mmol/L   Chloride 108 101 - 111 mmol/L   CO2 22 22 - 32 mmol/L   Glucose, Bld 136 (H) 65 - 99 mg/dL   BUN 9 6 - 20 mg/dL   Creatinine, Ser 0.57 0.44 - 1.00 mg/dL    Calcium 9.8 8.9 - 10.3 mg/dL   GFR calc non Af Amer >60 >60 mL/min   GFR calc Af Amer >60 >60 mL/min    Comment: (NOTE) The eGFR has been calculated using the CKD EPI equation. This calculation has not been validated in all clinical situations. eGFR's persistently <60 mL/min signify possible Chronic Kidney Disease.    Anion gap 8 5 - 15  CBC     Status: None   Collection Time: 04/20/16  1:35 PM  Result Value Ref Range   WBC 9.8 4.0 - 10.5 K/uL   RBC 4.48 3.87 - 5.11 MIL/uL   Hemoglobin 13.0 12.0 - 15.0 g/dL   HCT 39.3 36.0 - 46.0 %   MCV 87.7 78.0 - 100.0 fL   MCH 29.0 26.0 - 34.0 pg   MCHC 33.1 30.0 - 36.0 g/dL   RDW 14.8 11.5 - 15.5 %   Platelets 346 150 - 400 K/uL  I-stat troponin, ED     Status: None   Collection Time: 04/20/16  2:07 PM  Result Value Ref Range   Troponin i, poc 0.01 0.00 - 0.08 ng/mL   Comment 3            Comment: Due to the release kinetics of cTnI, a negative result within the first hours of the onset of symptoms does not rule out myocardial infarction with certainty. If myocardial infarction is still suspected, repeat the test at appropriate intervals.   Brain natriuretic peptide     Status: None   Collection Time: 04/20/16  5:10 PM  Result Value Ref Range   B Natriuretic Peptide 98.6 0.0 - 100.0 pg/mL  Protime-INR     Status: None   Collection Time: 04/20/16  5:10 PM  Result Value Ref Range   Prothrombin Time 12.1 11.4 - 15.2 seconds   INR 0.90   TSH     Status: None   Collection Time: 04/20/16  5:10 PM  Result Value Ref Range   TSH 2.352 0.350 - 4.500 uIU/mL    Comment: Performed by a 3rd Generation assay with a functional sensitivity of <=0.01 uIU/mL.  T4, free     Status: Abnormal   Collection Time: 04/20/16  5:10 PM  Result Value Ref Range   Free T4 1.14 (H) 0.61 - 1.12 ng/dL    Comment: (NOTE)  Biotin ingestion may interfere with free T4 tests. If the results are inconsistent with the TSH level, previous test results, or  the clinical presentation, then consider biotin interference. If needed, order repeat testing after stopping biotin.   Ammonia     Status: None   Collection Time: 04/20/16  5:12 PM  Result Value Ref Range   Ammonia 17 9 - 35 umol/L  I-stat troponin, ED     Status: None   Collection Time: 04/20/16  6:24 PM  Result Value Ref Range   Troponin i, poc 0.01 0.00 - 0.08 ng/mL   Comment 3            Comment: Due to the release kinetics of cTnI, a negative result within the first hours of the onset of symptoms does not rule out myocardial infarction with certainty. If myocardial infarction is still suspected, repeat the test at appropriate intervals.   Urinalysis, Routine w reflex microscopic (not at [facility])     Status: Abnormal   Collection Time: 04/20/16  7:33 PM  Result Value Ref Range   Color, Urine YELLOW YELLOW   APPearance CLEAR CLEAR   Specific Gravity, Urine 1.007 1.005 - 1.030   pH 6.0 5.0 - 8.0   Glucose, UA NEGATIVE NEGATIVE mg/dL   Hgb urine dipstick TRACE (A) NEGATIVE   Bilirubin Urine NEGATIVE NEGATIVE   Ketones, ur NEGATIVE NEGATIVE mg/dL   Protein, ur NEGATIVE NEGATIVE mg/dL   Nitrite NEGATIVE NEGATIVE   Leukocytes, UA NEGATIVE NEGATIVE  Urine microscopic-add on     Status: Abnormal   Collection Time: 04/20/16  7:33 PM  Result Value Ref Range   Squamous Epithelial / LPF 0-5 (A) NONE SEEN   WBC, UA 0-5 0 - 5 WBC/hpf   RBC / HPF 0-5 0 - 5 RBC/hpf   Bacteria, UA NONE SEEN NONE SEEN  Rapid urine drug screen (hospital performed)     Status: None   Collection Time: 04/20/16  7:33 PM  Result Value Ref Range   Opiates NONE DETECTED NONE DETECTED   Cocaine NONE DETECTED NONE DETECTED   Benzodiazepines NONE DETECTED NONE DETECTED   Amphetamines NONE DETECTED NONE DETECTED   Tetrahydrocannabinol NONE DETECTED NONE DETECTED   Barbiturates NONE DETECTED NONE DETECTED    Comment:        DRUG SCREEN FOR MEDICAL PURPOSES ONLY.  IF CONFIRMATION IS NEEDED FOR ANY PURPOSE,  NOTIFY LAB WITHIN 5 DAYS.        LOWEST DETECTABLE LIMITS FOR URINE DRUG SCREEN Drug Class       Cutoff (ng/mL) Amphetamine      1000 Barbiturate      200 Benzodiazepine   612 Tricyclics       244 Opiates          300 Cocaine          300 THC              50   D-dimer, quantitative (not at [facility])     Status: Abnormal   Collection Time: 04/20/16  9:34 PM  Result Value Ref Range   D-Dimer, Quant 1.57 (H) 0.00 - 0.50 ug/mL-FEU    Comment: (NOTE) At the manufacturer cut-off of 0.50 ug/mL FEU, this assay has been documented to exclude PE with a sensitivity and negative predictive value of 97 to 99%.  At this time, this assay has not been approved by the FDA to exclude DVT/VTE. Results should be correlated with clinical presentation.   Ethanol  Status: None   Collection Time: 04/20/16 10:45 PM  Result Value Ref Range   Alcohol, Ethyl (B) <5 <5 mg/dL    Comment:        LOWEST DETECTABLE LIMIT FOR SERUM ALCOHOL IS 5 mg/dL FOR MEDICAL PURPOSES ONLY   Troponin I     Status: None   Collection Time: 04/20/16 10:45 PM  Result Value Ref Range   Troponin I <0.03 <0.03 ng/mL  Magnesium     Status: None   Collection Time: 04/21/16  6:09 AM  Result Value Ref Range   Magnesium 1.8 1.7 - 2.4 mg/dL  Phosphorus     Status: None   Collection Time: 04/21/16  6:09 AM  Result Value Ref Range   Phosphorus 3.3 2.5 - 4.6 mg/dL  TSH     Status: None   Collection Time: 04/21/16  6:09 AM  Result Value Ref Range   TSH 1.727 0.350 - 4.500 uIU/mL    Comment: Performed by a 3rd Generation assay with a functional sensitivity of <=0.01 uIU/mL.  Comprehensive metabolic panel     Status: Abnormal   Collection Time: 04/21/16  6:09 AM  Result Value Ref Range   Sodium 140 135 - 145 mmol/L   Potassium 3.5 3.5 - 5.1 mmol/L   Chloride 110 101 - 111 mmol/L   CO2 22 22 - 32 mmol/L   Glucose, Bld 89 65 - 99 mg/dL   BUN 5 (L) 6 - 20 mg/dL   Creatinine, Ser 0.51 0.44 - 1.00 mg/dL   Calcium 8.9 8.9 - 10.3  mg/dL   Total Protein 6.4 (L) 6.5 - 8.1 g/dL   Albumin 3.3 (L) 3.5 - 5.0 g/dL   AST 31 15 - 41 U/L   ALT 22 14 - 54 U/L   Alkaline Phosphatase 109 38 - 126 U/L   Total Bilirubin 0.7 0.3 - 1.2 mg/dL   GFR calc non Af Amer >60 >60 mL/min   GFR calc Af Amer >60 >60 mL/min    Comment: (NOTE) The eGFR has been calculated using the CKD EPI equation. This calculation has not been validated in all clinical situations. eGFR's persistently <60 mL/min signify possible Chronic Kidney Disease.    Anion gap 8 5 - 15  CBC     Status: Abnormal   Collection Time: 04/21/16  6:09 AM  Result Value Ref Range   WBC 9.3 4.0 - 10.5 K/uL   RBC 4.11 3.87 - 5.11 MIL/uL   Hemoglobin 12.0 12.0 - 15.0 g/dL   HCT 35.9 (L) 36.0 - 46.0 %   MCV 87.3 78.0 - 100.0 fL   MCH 29.2 26.0 - 34.0 pg   MCHC 33.4 30.0 - 36.0 g/dL   RDW 14.8 11.5 - 15.5 %   Platelets 319 150 - 400 K/uL  Troponin I     Status: None   Collection Time: 04/21/16  6:09 AM  Result Value Ref Range   Troponin I <0.03 <0.03 ng/mL  Hemoglobin A1c     Status: Abnormal   Collection Time: 04/21/16  6:09 AM  Result Value Ref Range   Hgb A1c MFr Bld 5.9 (H) 4.8 - 5.6 %    Comment: (NOTE)         Pre-diabetes: 5.7 - 6.4         Diabetes: >6.4         Glycemic control for adults with diabetes: <7.0    Mean Plasma Glucose 123 mg/dL    Comment: (NOTE) Performed At: [facility]  [facility] [place], [place] [id number] Lindon Romp MD RA:[id number]   RPR     Status: None   Collection Time: 04/21/16  6:09 AM  Result Value Ref Range   RPR Ser Ql Non Reactive Non Reactive    Comment: (NOTE) Performed At: [facility] [place], [place] [id number] Lindon Romp MD [id number]   Vitamin B12     Status: None   Collection Time: 04/21/16  6:09 AM  Result Value Ref Range   Vitamin B-12 422 180 - 914 pg/mL    Comment: (NOTE) This assay is not validated for testing neonatal or myeloproliferative syndrome  specimens for Vitamin B12 levels.   Folate     Status: None   Collection Time: 04/21/16  6:09 AM  Result Value Ref Range   Folate 35.2 >5.9 ng/mL  Iron and TIBC     Status: None   Collection Time: 04/21/16  6:09 AM  Result Value Ref Range   Iron 89 28 - 170 ug/dL   TIBC 349 250 - 450 ug/dL   Saturation Ratios 26 10.4 - 31.8 %   UIBC 260 ug/dL  Ferritin     Status: None   Collection Time: 04/21/16  6:09 AM  Result Value Ref Range   Ferritin 71 11 - 307 ng/mL  Reticulocytes     Status: None   Collection Time: 04/21/16  6:09 AM  Result Value Ref Range   Retic Ct Pct 0.8 0.4 - 3.1 %   RBC. 4.11 3.87 - 5.11 MIL/uL   Retic Count, Manual 32.9 19.0 - 186.0 K/uL  Troponin I     Status: None   Collection Time: 04/21/16 11:19 AM  Result Value Ref Range   Troponin I <0.03 <0.03 ng/mL  Urinalysis, Routine w reflex microscopic (not at [facility])     Status: Abnormal   Collection Time: 04/21/16  2:11 PM  Result Value Ref Range   Color, Urine YELLOW YELLOW   APPearance CLEAR CLEAR   Specific Gravity, Urine 1.013 1.005 - 1.030   pH 6.0 5.0 - 8.0   Glucose, UA NEGATIVE NEGATIVE mg/dL   Hgb urine dipstick NEGATIVE NEGATIVE   Bilirubin Urine NEGATIVE NEGATIVE   Ketones, ur 15 (A) NEGATIVE mg/dL   Protein, ur NEGATIVE NEGATIVE mg/dL   Nitrite NEGATIVE NEGATIVE   Leukocytes, UA NEGATIVE NEGATIVE    Comment: MICROSCOPIC NOT DONE ON URINES WITH NEGATIVE PROTEIN, BLOOD, LEUKOCYTES, NITRITE, OR GLUCOSE <1000 mg/dL.  Urine culture     Status: None   Collection Time: 04/21/16  2:11 PM  Result Value Ref Range   Specimen Description URINE, RANDOM    Special Requests NONE    Culture NO GROWTH    Report Status 04/22/2016 FINAL   Basic metabolic panel     Status: None   Collection Time: 04/22/16  9:08 AM  Result Value Ref Range   Sodium 141 135 - 145 mmol/L   Potassium 3.7 3.5 - 5.1 mmol/L   Chloride 111 101 - 111 mmol/L   CO2 23 22 - 32 mmol/L   Glucose, Bld 88 65 - 99 mg/dL   BUN 10 6 - 20 mg/dL    Creatinine, Ser 0.52 0.44 - 1.00 mg/dL   Calcium 9.3 8.9 - 10.3 mg/dL   GFR calc non Af Amer >60 >60 mL/min   GFR calc Af Amer >60 >60 mL/min    Comment: (NOTE) The eGFR has been calculated using the CKD EPI equation. This calculation  has not been validated in all clinical situations. eGFR's persistently <60 mL/min signify possible Chronic Kidney Disease.    Anion gap 7 5 - 15    Current Facility-Administered Medications  Medication Dose Route Frequency Provider Last Rate Last Dose  . acetaminophen (TYLENOL) tablet 650 mg  650 mg Oral Q6H PRN Toy Baker, MD   650 mg at 04/21/16 1624   Or  . acetaminophen (TYLENOL) suppository 650 mg  650 mg Rectal Q6H PRN Toy Baker, MD      . albuterol (PROVENTIL) (2.5 MG/3ML) 0.083% nebulizer solution 2.5 mg  2.5 mg Inhalation Q4H PRN Toy Baker, MD      . amLODipine (NORVASC) tablet 5 mg  5 mg Oral Daily Toy Baker, MD   5 mg at 04/22/16 1005  . doxycycline (VIBRA-TABS) tablet 100 mg  100 mg Oral Q12H Toy Baker, MD   100 mg at 04/22/16 1005  . latanoprost (XALATAN) 0.005 % ophthalmic solution 1 drop  1 drop Both Eyes QHS Toy Baker, MD   1 drop at 04/21/16 2155  . levothyroxine (SYNTHROID, LEVOTHROID) tablet 88 mcg  88 mcg Oral QAC breakfast Toy Baker, MD   88 mcg at 04/22/16 0902  . ondansetron (ZOFRAN) tablet 4 mg  4 mg Oral Q6H PRN Toy Baker, MD       Or  . ondansetron (ZOFRAN) injection 4 mg  4 mg Intravenous Q6H PRN Toy Baker, MD      . oxybutynin (DITROPAN) tablet 5 mg  5 mg Oral BID Eugenie Filler, MD   5 mg at 04/22/16 1005  . QUEtiapine (SEROQUEL) tablet 12.5 mg  12.5 mg Oral QHS Benjamine Mola, FNP   12.5 mg at 04/21/16 2155  . sodium chloride flush (NS) 0.9 % injection 3 mL  3 mL Intravenous Q12H Toy Baker, MD   3 mL at 04/22/16 1045    Musculoskeletal: Strength & Muscle Tone: decreased Gait & Station: unsteady Patient leans: N/A  Psychiatric  Specialty Exam: Physical Exam  Review of Systems  Psychiatric/Behavioral: Positive for depression (mild). Negative for hallucinations, substance abuse and suicidal ideas. The patient is nervous/anxious (mild) and has insomnia.   All other systems reviewed and are negative.   Blood pressure (!) 137/47, pulse 74, temperature 98.3 F (36.8 C), temperature source Oral, resp. rate 19, height 5' 5"  (1.651 m), weight 49 kg (108 lb 1.6 oz), SpO2 96 %.Body mass index is 17.99 kg/m.  General Appearance: Casual and Fairly Groomed  Eye Contact:  Good  Speech:  Pressured yet improving   Volume:  Normal  Mood:  Anxious improving  Affect:  Appropriate and Congruent  Thought Process:  Coherent, Goal Directed, Linear and Descriptions of Associations: Circumstantial  Orientation:  Full (Time, Place, and Person)  Thought Content:  Treatment plan, medications, fully aware of treatment, family, providers, staff, medications, no hallucinations, no delusions  Suicidal Thoughts:  No  Homicidal Thoughts:  No  Memory:  Immediate;   Fair Recent;   Fair Remote;   Fair  Judgement:  Fair  Insight:  Good  Psychomotor Activity:  Increased  Concentration:  Concentration: Fair and Attention Span: Fair  Recall:  Good  Fund of Knowledge:  Good  Language:  Good  Akathisia:  No  Handed:    AIMS (if indicated):     Assets:  Communication Skills Desire for Improvement Financial Resources/Insurance Housing Intimacy Leisure Time Resilience Social Support Talents/Skills Vocational/Educational  ADL's:  Requires mild assistance due to unsteady gait (improving per staff  report)   Cognition:  WNL  Sleep:      Treatment Plan Summary: Altered mental status yet improving, with some pressures speech and anxiety:  Medications: -Titrate Seroquel to 66m po qhs for mood stabilization (improving thus far)  Disposition: No evidence of imminent risk to self or others at present.   Supportive therapy provided about  ongoing stressors. Will see again tomorrow to manage medications (WILL CONSULT AGAIN) See pt again on 04/23/16 Saturday  WBenjamine Mola FNorth [place]11/08/2015 12:32 PM   Agree with NP Consult Note as above

## 2016-04-22 NOTE — Progress Notes (Signed)
PROGRESS NOTE    Cheryl DarbyDonna E Koke  AVW:[id number] DOB: 04/29/1946 DOA: 04/20/2016 PCP: Norberto SorensonSHAW,EVA, MD    Brief Narrative:  70 y.o. female with medical history significant of depression manic episode which felt to be secondary to hyperthyroidism PUD , Right hip replacement, HTN, hypothyroidism,  emphysema being admitted for manic episode unclear etiology    Assessment & Plan:   Principal Problem:   Altered mental status Active Problems:   Essential hypertension   Essential (primary) hypertension   Acute encephalopathy   Manic state (HCC)   Chest pain  #1 manic state/acute encephalopathy Patient noted to have pressured speech and tangential on admission. Infectious workup negative to date. Patient with no focal neurological deficits. Head CT negative. Repeat urinalysis negative. Patient has been seen by psychiatry and patient started a lower dose of Seroquel. As recommended per psychiatry that Seroquel dose increased to 25 mg daily at bedtime. Patient will likely need to follow-up with psychiatry in outpatient setting.    #2 hypertension Stable.  #3 hypothyroidism Continue Synthroid.  #4 chest pain Questionable etiology. Nonreproducible. Patient did have elevated d-dimer and a such CT angiogram chest was done was negative. Lower extremity Dopplers negative for DVT. Cardiac enzymes negative. 2-D echo pending. Follow.  #5 left toe fracture Will need outpatient follow-up with orthopedics.  #6 ?? Cellulitis Patient was on IV vancomycin on admission which was subsequently transitioned to oral doxycycline. Continue doxycycline. Clinical improvement.    DVT prophylaxis: SCDs Code Status: Full Family Communication: Updated patient. Updated and daughter via telephone. Disposition Plan: Likely home. Pending PT evaluation, and psychiatric evaluation.   Consultants:   Psychiatry 04/21/2016  Procedures:   CT head without contrast 04/20/2016  CT angiogram chest  04/21/2016  Chest x-ray 04/20/2016  X-ray of the left foot 04/20/2016  X-ray of the L-spine 04/20/2016  Lower extremity Dopplers 04/20/2016  2 d echo pending  Antimicrobials:   Doxycycline 04/21/2016   Subjective: Patient with less pressured speech today. No chest pain. No shortness of breath. Patient still complaining of urinary frequency.   Objective: Vitals:   04/21/16 2105 04/22/16 0603 04/22/16 0856 04/22/16 1000  BP: (!) 145/76 125/78 (!) 133/50 (!) 137/47  Pulse: 87 76 (!) 54 74  Resp: 18 19    Temp: 98.2 F (36.8 C) 98.3 F (36.8 C)    TempSrc: Oral Oral    SpO2: 97% 96%    Weight:      Height:        Intake/Output Summary (Last 24 hours) at 04/22/16 1153 Last data filed at 04/22/16 1130  Gross per 24 hour  Intake                0 ml  Output             1900 ml  Net            -1900 ml   Filed Weights   04/20/16 1322 04/20/16 2239  Weight: 47.6 kg (105 lb) 49 kg (108 lb 1.6 oz)    Examination:  General exam: Appears calm and comfortable  Respiratory system: Clear to auscultation. Respiratory effort normal. Cardiovascular system: S1 & S2 heard, RRR. No JVD, murmurs, rubs, gallops or clicks. No pedal edema. Gastrointestinal system: Abdomen is nondistended, soft and nontender. No organomegaly or masses felt. Normal bowel sounds heard. Central nervous system: Alert and oriented. No focal neurological deficits. Extremities: Symmetric 5 x 5 power. Skin: Left great toe less erythemous.  Psychiatry: Patient with less pressured speech.  Judgment fair.    Data Reviewed: I have personally reviewed following labs and imaging studies  CBC:  Recent Labs Lab 04/20/16 1149 04/20/16 1335 04/21/16 0609  WBC 9.4 9.8 9.3  HGB 13.4 13.0 12.0  HCT 38.5 39.3 35.9*  MCV 86.0 87.7 87.3  PLT  --  346 319   Basic Metabolic Panel:  Recent Labs Lab 04/20/16 1115 04/20/16 1335 04/21/16 0609 04/22/16 0908  NA 140 138 140 141  K 3.8 3.7 3.5 3.7  CL 107 108  110 111  CO2 22 22 22 23   GLUCOSE 118* 136* 89 88  BUN 13 9 5* 10  CREATININE 0.62 0.57 0.51 0.52  CALCIUM 9.7 9.8 8.9 9.3  MG  --   --  1.8  --   PHOS  --   --  3.3  --    GFR: Estimated Creatinine Clearance: 49.9 mL/min (by C-G formula based on SCr of 0.52 mg/dL). Liver Function Tests:  Recent Labs Lab 04/20/16 1115 04/21/16 0609  AST 30 31  ALT 22 22  ALKPHOS 119 109  BILITOT 0.3 0.7  PROT 7.1 6.4*  ALBUMIN 4.0 3.3*   No results for input(s): LIPASE, AMYLASE in the last 168 hours.  Recent Labs Lab 04/20/16 1712  AMMONIA 17   Coagulation Profile:  Recent Labs Lab 04/20/16 1710  INR 0.90   Cardiac Enzymes:  Recent Labs Lab 04/20/16 2245 04/21/16 0609 04/21/16 1119  TROPONINI <0.03 <0.03 <0.03   BNP (last 3 results) No results for input(s): PROBNP in the last 8760 hours. HbA1C:  Recent Labs  04/21/16 0609  HGBA1C 5.9*   CBG: No results for input(s): GLUCAP in the last 168 hours. Lipid Profile: No results for input(s): CHOL, HDL, LDLCALC, TRIG, CHOLHDL, LDLDIRECT in the last 72 hours. Thyroid Function Tests:  Recent Labs  04/20/16 1115  04/20/16 1710 04/21/16 0609  TSH 2.71  < > 2.352 1.727  T4TOTAL 11.6  --   --   --   FREET4  --   --  1.14*  --   < > = values in this interval not displayed. Anemia Panel:  Recent Labs  04/21/16 0609  VITAMINB12 422  FOLATE 35.2  FERRITIN 71  TIBC 349  IRON 89  RETICCTPCT 0.8   Sepsis Labs: No results for input(s): PROCALCITON, LATICACIDVEN in the last 168 hours.  Recent Results (from the past 240 hour(s))  Urine culture     Status: None   Collection Time: 04/20/16 11:33 AM  Result Value Ref Range Status   Organism ID, Bacteria NO GROWTH  Final  Urine culture     Status: None   Collection Time: 04/21/16  2:11 PM  Result Value Ref Range Status   Specimen Description URINE, RANDOM  Final   Special Requests NONE  Final   Culture NO GROWTH  Final   Report Status 04/22/2016 FINAL  Final          Radiology Studies: Dg Chest 2 View  Result Date: 04/20/2016 CLINICAL DATA:  Left-sided chest pain. EXAM: CHEST  2 VIEW COMPARISON:  Two-view chest x-ray 10/31/2015 FINDINGS: The heart size is normal. Emphysematous changes are again seen. There is no edema or effusion. No focal airspace disease is present. No significant effusions are present. The visualized soft tissues and bony thorax are unremarkable. Surgical clips are present at the gallbladder fossa. IMPRESSION: 1. No acute cardiopulmonary disease or significant interval change. 2. Negative two view chest. Electronically Signed   By: Marin Roberts  M.D.   On: 04/20/2016 14:16   Ct Head Wo Contrast  Result Date: 04/20/2016 CLINICAL DATA:  Recent fall with increased confusion EXAM: CT HEAD WITHOUT CONTRAST TECHNIQUE: Contiguous axial images were obtained from the base of the skull through the vertex without intravenous contrast. COMPARISON:  01/22/2013 FINDINGS: Brain: No acute territorial infarction, intracranial hemorrhage or focal mass lesion is present. No extra-axial fluid collection. Moderate atrophy. Ventricles similar size and morphology compared to prior. Mild periventricular white matter hypodensity, presumed small vessel disease. Vascular: No hyperdense vessels.  Carotid artery calcifications. Skull: Heterogeneous attenuation likely related to osteopenia. Right mastoid sclerosis. No fracture. Sinuses/Orbits: Mucosal opacification of the ethmoids and right maxillary sinus. No acute orbital abnormality. Other: None IMPRESSION: No CT evidence for acute intracranial abnormality. Electronically Signed   By: Jasmine PangKim  Fujinaga M.D.   On: 04/20/2016 18:39   Ct Angio Chest Pe W Or Wo Contrast  Result Date: 04/21/2016 CLINICAL DATA:  Chronic mild left-sided chest pain. Initial encounter. EXAM: CT ANGIOGRAPHY CHEST WITH CONTRAST TECHNIQUE: Multidetector CT imaging of the chest was performed using the standard protocol during bolus  administration of intravenous contrast. Multiplanar CT image reconstructions and MIPs were obtained to evaluate the vascular anatomy. CONTRAST:  50 mL of Isovue 370 IV contrast COMPARISON:  Chest radiograph performed earlier today at 1:51 p.m. FINDINGS: Cardiovascular:  There is no evidence of pulmonary embolus. Mild biatrial enlargement is noted. The heart is otherwise grossly unremarkable. Mild calcification is noted along the aortic arch. The great vessels are unremarkable in appearance. Mediastinum/Nodes: The mediastinum is otherwise grossly unremarkable. No mediastinal lymphadenopathy is seen. No pericardial effusion is identified. The thyroid gland is not well characterized. No dominant thyroid mass is seen. No axillary lymphadenopathy is appreciated. Lungs/Pleura: Minimal bibasilar atelectasis or scarring is noted. The lungs are otherwise grossly clear. No pleural effusion or pneumothorax is seen. No masses are identified. Upper Abdomen: The visualized portions of the liver and spleen are unremarkable. The patient is status post cholecystectomy, with clips noted at the gallbladder fossa. The pancreas is difficult to fully assess. The visualized portions of the adrenal glands and left kidney are unremarkable. Musculoskeletal: No acute osseous abnormalities are identified. Multilevel vacuum phenomenon is noted along the lower thoracic and upper lumbar spine, with endplate sclerotic change and irregularity noted at L1-L2. The visualized musculature is unremarkable in appearance. Review of the MIP images confirms the above findings. IMPRESSION: 1. No evidence of pulmonary embolus. 2. Minimal bibasilar atelectasis or scarring noted. Lungs otherwise clear. 3. Mild biatrial enlargement noted. 4. Mild degenerative change along the lower thoracic and upper lumbar spine. Electronically Signed   By: Roanna RaiderJeffery  Chang M.D.   On: 04/21/2016 00:23        Scheduled Meds: . amLODipine  5 mg Oral Daily  . doxycycline   100 mg Oral Q12H  . latanoprost  1 drop Both Eyes QHS  . levothyroxine  88 mcg Oral QAC breakfast  . oxybutynin  5 mg Oral BID  . QUEtiapine  12.5 mg Oral QHS  . sodium chloride flush  3 mL Intravenous Q12H   Continuous Infusions:    LOS: 0 days    Time spent: 35 minutes    Joshawn Crissman, MD [facility] Pager [phone]   If 7PM-7AM, please contact night-coverage [email or web address] Password [email or web address] 04/22/2016, 11:53 AM

## 2016-04-22 NOTE — Progress Notes (Signed)
PT Cancellation Note  Patient Details Name: Rachael DarbyDonna E Southern MRN: [id number] DOB: 04/01/1946   Cancelled Treatment:    Reason Eval/Treat Not Completed: Patient at procedure or test/unavailable. Pt off unit for procedure, will attempt PT eval as appropriate   Nubia Ziesmer 04/22/2016, 1:48 PM

## 2016-04-23 DIAGNOSIS — R079 Chest pain, unspecified: Secondary | ICD-10-CM | POA: Diagnosis not present

## 2016-04-23 DIAGNOSIS — G9340 Encephalopathy, unspecified: Secondary | ICD-10-CM | POA: Diagnosis not present

## 2016-04-23 DIAGNOSIS — R350 Frequency of micturition: Secondary | ICD-10-CM | POA: Diagnosis present

## 2016-04-23 DIAGNOSIS — R3915 Urgency of urination: Secondary | ICD-10-CM | POA: Diagnosis present

## 2016-04-23 DIAGNOSIS — F309 Manic episode, unspecified: Secondary | ICD-10-CM | POA: Diagnosis not present

## 2016-04-23 DIAGNOSIS — I10 Essential (primary) hypertension: Secondary | ICD-10-CM | POA: Diagnosis not present

## 2016-04-23 LAB — BASIC METABOLIC PANEL
ANION GAP: 7 (ref 5–15)
BUN: 7 mg/dL (ref 6–20)
CALCIUM: 9.1 mg/dL (ref 8.9–10.3)
CO2: 26 mmol/L (ref 22–32)
Chloride: 106 mmol/L (ref 101–111)
Creatinine, Ser: 0.6 mg/dL (ref 0.44–1.00)
GLUCOSE: 76 mg/dL (ref 65–99)
POTASSIUM: 3.8 mmol/L (ref 3.5–5.1)
SODIUM: 139 mmol/L (ref 135–145)

## 2016-04-23 LAB — OSMOLALITY: OSMOLALITY: 295 mosm/kg (ref 275–295)

## 2016-04-23 LAB — OSMOLALITY, URINE: Osmolality, Ur: 142 mOsm/kg — ABNORMAL LOW (ref 300–900)

## 2016-04-23 MED ORDER — DOXYCYCLINE HYCLATE 100 MG PO TABS
100.0000 mg | ORAL_TABLET | Freq: Two times a day (BID) | ORAL | 0 refills | Status: AC
Start: 2016-04-23 — End: 2016-04-28

## 2016-04-23 MED ORDER — OXYBUTYNIN CHLORIDE 5 MG PO TABS: 5.0000 mg | ORAL_TABLET | Freq: Three times a day (TID) | ORAL | 0 refills | Status: AC

## 2016-04-23 MED ORDER — QUETIAPINE FUMARATE 25 MG PO TABS: 25.0000 mg | ORAL_TABLET | Freq: Every day | ORAL | 1 refills | Status: AC

## 2016-04-23 NOTE — Progress Notes (Signed)
Patient was discharged home with home health by MD order; discharged instructions  review and give to patient and her husband with care notes and prescriptions; IV DIC;  patient will be escorted to the car by RN via wheelchair.

## 2016-04-23 NOTE — Progress Notes (Signed)
Pt had on & off episode of HR in the 40's.Pt.is asymptomatic.MD on call made aware.Will continue to monitor pt.

## 2016-04-23 NOTE — Care Management Note (Signed)
Case Management Note  Patient Details  Name: Cheryl [name] MRN: [id number] Date of Birth: 04/25/1946  Subjective/Objective:                  Altered mental status with some pressured speech Action/Plan: Discharge planning Expected Discharge Date:  04/23/16               Expected Discharge Plan:  Home w Home Health Services  In-House Referral:     Discharge planning Services  CM Consult  Post Acute Care Choice:  Home Health Choice offered to:  Patient, Adult Children  DME Arranged:  N/A DME Agency:  NA  HH Arranged:  PT, Nurse's Aide HH Agency:  [facility]  Status of Service:  Completed, signed off  If discussed at Long Length of Stay Meetings, dates discussed:    Additional Comments: CM met with pt and spoke with pt's daughter, Mandy [phone] per pt permission to offer choice of recc HHPT/Aide.  Family choose [facility] to render HHPT/aide.  CM has reqeusted orders and face to face from MD.  Referral called to [facility] rep, Jermaine who is waiting on orders and F2F to arrange for start of care.  Pt has a cane, rolling walker and elevated commode at home. No other CM needs were communicated. ,  Christine, RN 04/23/2016, 1:49 PM  

## 2016-04-23 NOTE — Evaluation (Signed)
Physical Therapy Evaluation Patient Details Name: Cheryl DarbyDonna E Housholder MRN: [id number] DOB: 11/05/1945 Today's Date: 04/23/2016   History of Present Illness  Patient is a 70 yo female admitted 04/20/16 with pressured speech and inability to sleep for past 3 days urine has been dark patient has fallen 1 week ago and has swelling of her left second toe.   Patient with manic episode, AMS, Lt to fracture.   PMH:  Arthritis, asthma, cardiomegaly, HLD, HTN, MVR, partial deafness-hearing aids, anxiety, Rt THA, manic depressive episode    Clinical Impression  Patient presents with problems listed below.  Will benefit from acute PT to maximize functional mobility prior to discharge.  Patient with decreased strength, balance, and cognition, impacting safe mobility.  Recommend f/u HHPT for continued therapy.  Recommend HH Aide to assist with ADL's.  During session, answered multiple questions for patient, sister, and daughter.    Follow Up Recommendations Home health PT;Supervision for mobility/[facility](HH Aide)    Equipment Recommendations  None recommended by PT    Recommendations for Other Services       Precautions / Restrictions Precautions Precautions: Fall Precaution Comments: Fall at home on stairs Restrictions Weight Bearing Restrictions: No      Mobility  Bed Mobility Overal bed mobility: Modified Independent             General bed mobility comments: Increased time  Transfers Overall transfer level: Needs assistance Equipment used: Quad cane Transfers: Sit to/from Stand Sit to Stand: Min guard         General transfer comment: Assist for safety/balance from bed and toilet.  Ambulation/Gait Ambulation/Gait assistance: Min assist Ambulation Distance (Feet): 82 Feet Assistive device: Quad cane Gait Pattern/deviations: Step-through pattern;Decreased step length - left;Decreased step length - right;Decreased stride length;Shuffle;Trunk flexed;Drifts right/left Gait velocity:  decreased Gait velocity interpretation: Below normal speed for age/gender General Gait Details: Verbal cues for safe use of quad cane.  Patient picks up cane to point at objects.  Slow, unsteady gait.  Stairs            Wheelchair Mobility    Modified Rankin (Stroke Patients Only)       Balance Overall balance assessment: Needs assistance;History of Falls         Standing balance support: Single extremity supported Standing balance-Leahy Scale: Poor                               Pertinent Vitals/Pain Pain Assessment: Faces Faces Pain Scale: Hurts little more Pain Location: Rt hip/back - during gait Pain Descriptors / Indicators: Aching;Sore Pain Intervention(s): Limited activity within patient's tolerance;Monitored during session;Repositioned    Home Living Family/patient expects to be discharged to:: Private residence Living Arrangements: Spouse/significant other Available Help at Discharge: Family;Available 24 hours/day (Husband works 12-hour shifts; dtr works; patient home alone) Type of Home: House Home Access: Stairs to enter Entrance Stairs-Rails: None Secretary/administratorntrance Stairs-Number of Steps: 3 Home Layout: Two level;Able to live on main level with bedroom/bathroom Home Equipment: Dan HumphreysWalker - 2 wheels;Cane - single point;Electric scooter;Shower seat;Bedside commode      Prior Function Level of Independence: Independent with assistive device(s);Needs assistance   Gait / Transfers Assistance Needed: Ambulates with small-based quad cane.  ADL's / Homemaking Assistance Needed: Assist for bathing, meal prep, housekeeping.  Patient now taking sponge baths - unable to get into tub.        Hand Dominance   Dominant Hand: Right  Extremity/Trunk Assessment   Upper Extremity Assessment: Overall WFL for tasks assessed           Lower Extremity Assessment: Generalized weakness      Cervical / Trunk Assessment: Kyphotic  Communication    Communication: HOH (Hearing aids)  Cognition Arousal/Alertness: Awake/alert Behavior During Therapy: Restless;Impulsive Overall Cognitive Status: Impaired/Different from baseline Area of Impairment: Attention;Following commands;Safety/judgement   Current Attention Level: Sustained (Tangential.  Cues to focus on task at hand)   Following Commands: Follows one step commands inconsistently Safety/Judgement: Decreased awareness of safety          General Comments      Exercises     Assessment/Plan    PT Assessment Patient needs continued PT services  PT Problem List Decreased strength;Decreased activity tolerance;Decreased balance;Decreased mobility;Decreased cognition;Decreased knowledge of use of DME;Decreased safety awareness;Pain          PT Treatment Interventions DME instruction;Gait training;Stair training;Functional mobility training;Therapeutic activities;Therapeutic exercise;Balance training;Cognitive remediation;Patient/family education    PT Goals (Current goals can be found in the Care Plan section)  Acute Rehab PT Goals Patient Stated Goal: None stated PT Goal Formulation: With patient/family Time For Goal Achievement: 04/30/16 Potential to Achieve Goals: Good    Frequency Min 3X/week   Barriers to discharge Decreased caregiver support Patient alone during day when husband at work (12-hour shifts)    Co-evaluation               End of Session Equipment Utilized During Treatment: Gait belt Activity Tolerance: Patient limited by fatigue Patient left: in bed;with call bell/phone within reach;with family/visitor present (sitting EOB) Nurse Communication: Mobility status (Recommend HHPT and HH Aide)    Functional Assessment Tool Used: Clinical judgement Functional Limitation: Mobility: Walking and moving around Mobility: Walking and Moving Around Current Status ([id number]): At least 20 percent but less than 40 percent impaired, limited or  restricted Mobility: Walking and Moving Around Goal Status [phone](G8979): At least 1 percent but less than 20 percent impaired, limited or restricted    Time: 1113-1211 PT Time Calculation (min) (ACUTE ONLY): 58 min   Charges:   PT Evaluation $PT Eval Moderate Complexity: 1 Procedure PT Treatments $Gait Training: 8-22 mins $Therapeutic Activity: 8-22 mins $Self Care/Home Management: 8-22   PT G Codes:   PT G-Codes **NOT FOR INPATIENT CLASS** Functional Assessment Tool Used: Clinical judgement Functional Limitation: Mobility: Walking and moving around Mobility: Walking and Moving Around Current Status ([id number](G8978): At least 20 percent but less than 40 percent impaired, limited or restricted Mobility: Walking and Moving Around Goal Status [phone](G8979): At least 1 percent but less than 20 percent impaired, limited or restricted    Vena AustriaDavis, Jesseca Marsch H 04/23/2016, 12:31 PM Durenda HurtSusan H. Renaldo Fiddleravis, PT, [facility] CenterMBA Acute Rehab Services Pager [phone]

## 2016-04-23 NOTE — Discharge Summary (Signed)
Physician Discharge Summary  Cheryl DarbyDonna E [name] EAV:[id number] DOB: 06/13/1946 DOA: 04/20/2016  PCP: Norberto SorensonSHAW,EVA, MD  Admit date: 04/20/2016 Discharge date: 04/23/2016  Time spent: 60 minutes  Recommendations for Outpatient Follow-up:  1. Follow-up with outpatient psychiatry for further management of mania. 2. Follow-up with SHAW,EVA, MD in 2 weeks. On follow-up patient's urinary frequency/urgency would need to be reassessed. Patient will benefit from outpatient referral to orthopedics for left second toe fracture. Cellulitis of left foot will need to be reassessed. 3.    Discharge Diagnoses:  Principal Problem:   Manic state (HCC) Active Problems:   Essential hypertension   Essential (primary) hypertension   Acute encephalopathy   Altered mental status   Chest pain   Urinary frequency   Urinary urgency   Discharge Condition: Stable and improved  Diet recommendation: Regular  Filed Weights   04/20/16 1322 04/20/16 2239  Weight: 47.6 kg (105 lb) 49 kg (108 lb 1.6 oz)    History of present illness:  Per Dr. Harvie Bridgeoutova Titania E Sun is a 70 y.o. female with medical history significant of depression manic episode which felt to be secondary to hyperthyroidism PUD , Right hip replacement, HTN, hypothyroidism,  emphysema  Presented with pressured speech and inability to sleep for past 3 days urine has been dark patient has fallen 1 week ago and has swelling of her left second toe. Patient endorsed trouble sleeping. Family feels that she has not been herself for past 3 days. No new medications no change in her medications. Patient does not drink or use any substances. She lives with her husband. Patient endorsed chest pain has been on and off for past one month but unable to provide any details.  Examination has been endorsing mild left chest pain radiates to her left arm for past month has been intermittent. Her daughter stated that 3 years ago she's been admitted for similar episode which  thought to be related to her thyroid disease Patient has had left leg edema unsure for how long. Says the same leg that has swollen toe. He was seen on admission by her primary care provider who sent her into much department to be evaluated for PE She reported being compliant with her medications. Patient had hip replacement and postoperatively was on anticoagulation but currently there has been discontinued.  Hospital Course:  #1 manic state/acute encephalopathy Patient noted to have pressured speech and tangential thoughts on admission. Infectious workup negative to date. Patient with no focal neurological deficits. Head CT negative. Urinalysis was repeated which was negative for any acute abnormalities. Patient denied any dysuria. Patient had complaints of urinary frequency. Urine cultures which were obtained were negative. Psychiatry was consulted and it was noted that patient was not an eminent risk to her or herself and did not require inpatient psychiatric hospitalization. Patient was started on Seroquel 12.5 mg daily at bedtime with improvement with her pressured speech. Seroquel dose was uptitrated to 25 mg daily at bedtime with continued improvement. It was felt per psychiatry that patient was stable to be discharged home with outpatient follow-up with psychiatry. Patient will be discharged in stable and improved condition.   #2 hypertension Stable.  #3 hypothyroidism Continued on home regimen of Synthroid.  #4 chest pain Questionable etiology. Nonreproducible. Patient did have elevated d-dimer and as such CT angiogram chest was done was negative. Lower extremity Dopplers negative for DVT. Cardiac enzymes negative. 2-D echo which was obtained at a EF of 60-65% with no wall motion abnormalities. Patient did not  have any further chest pain and patient be discharged home in stable and improved condition. Outpatient follow-up.  #5 left toe fracture Patient noted on plain films of the  left foot to have an acute fracture of the distal aspect of the proximal phalanx of the left second toe. Will need outpatient follow-up with orthopedics.  #6 ?? Cellulitis Patient was on IV vancomycin on admission which was subsequently transitioned to oral doxycycline. Patient improved clinically on doxycycline be discharged home on 5 more days of oral doxycycline to complete a course of antibiotic treatment. Outpatient follow-up.  #7 urinary frequency/urgency Patient was noted to have urinary frequency and urgency on admission. Urinalysis was obtained which was negative for any acute urinary tract infection. Patient also was noted to be drinking water frequently. Patient was started on dig for pain 5 mg 3 times daily. TSH was within normal limits. Patient's hemoglobin A1c was 5.9. Patient's urine osmolality as well as lab work was reviewed with nephrology. Patient was noted to have a normal sodium level. Do not think patient has diabetes insipidus. Patient will need to follow-up with PCP in the outpatient setting.   Procedures:  CT head without contrast 04/20/2016  CT angiogram chest 04/21/2016  Chest x-ray 04/20/2016  X-ray of the left foot 04/20/2016  X-ray of the L-spine 04/20/2016  Lower extremity Dopplers 04/20/2016  2 d echo 04/22/2016   Consultations:  Psychiatry 04/21/2016  Discharge Exam: Vitals:   04/22/16 2135 04/23/16 0523  BP: (!) 132/59 (!) 126/97  Pulse: 67 68  Resp: 18 18  Temp: 97.9 F (36.6 C) 98.1 F (36.7 C)    General: NAD.Speech less pressured. Cardiovascular: RRR Respiratory: CTAB  Discharge Instructions   Discharge Instructions    Diet general    Complete by:  As directed    Discharge instructions    Complete by:  As directed    Follow up with outpatient psychiatry.   Increase activity slowly    Complete by:  As directed      Current Discharge Medication List    START taking these medications   Details  doxycycline  (VIBRA-TABS) 100 MG tablet Take 1 tablet (100 mg total) by mouth every 12 (twelve) hours. Take for 5 days then stop. Qty: 10 tablet, Refills: 0    oxybutynin (DITROPAN) 5 MG tablet Take 1 tablet (5 mg total) by mouth 3 (three) times daily. Qty: 90 tablet, Refills: 0    QUEtiapine (SEROQUEL) 25 MG tablet Take 1 tablet (25 mg total) by mouth at bedtime. Qty: 30 tablet, Refills: 1      CONTINUE these medications which have NOT CHANGED   Details  acetaminophen (TYLENOL) 500 MG tablet Take 1-2 tablets (500-1,000 mg total) by mouth every 6 (six) hours as needed for mild pain or moderate pain. Qty: 60 tablet, Refills: 0    albuterol (PROAIR HFA) 108 (90 Base) MCG/ACT inhaler Inhale 2 puffs into the lungs every 4 (four) hours as needed for wheezing or shortness of breath. Qty: 18 g, Refills: 5    amLODipine-benazepril (LOTREL) 5-20 MG capsule Take 1 capsule by mouth every evening. Qty: 90 capsule, Refills: 1   Associated Diagnoses: Essential hypertension    aspirin EC 81 MG tablet Take 324 mg by mouth once.    bimatoprost (LUMIGAN) 0.03 % ophthalmic solution Place 1 drop into both eyes at bedtime.     Fluticasone-Salmeterol (ADVAIR DISKUS) 500-50 MCG/DOSE AEPB Inhale 1 puff into the lungs 2 (two) times daily. Qty: 60 each, Refills: 5  Associated Diagnoses: Dyspnea; Asthma, mild persistent, uncomplicated    levothyroxine (SYNTHROID) 88 MCG tablet Take 1 tablet (88 mcg total) by mouth daily before breakfast. Qty: 90 tablet, Refills: 1      STOP taking these medications     HYDROmorphone (DILAUDID) 4 MG tablet      nystatin (MYCOSTATIN) 100000 UNIT/ML suspension        Allergies  Allergen Reactions  . Aspirin     REACTION: asthma flare  . Codeine     REACTION: ulcer  . Orange Juice [Orange Oil]     Gi upset   Follow-up Information    [facility] .   Why:  home health physical therapy and aide Contact information: [address] [place]  [address] [phone]        [name], MD. Schedule an appointment as soon as possible for a visit in 2 week(s).   Specialty:  Family Medicine Contact information: [address] [place] [address] [phone]            The results of significant diagnostics from this hospitalization (including imaging, microbiology, ancillary and laboratory) are listed below for reference.    Significant Diagnostic Studies: Dg Chest 2 View  Result Date: 04/20/2016 CLINICAL DATA:  Left-sided chest pain. EXAM: CHEST  2 VIEW COMPARISON:  Two-view chest x-ray 10/31/2015 FINDINGS: The heart size is normal. Emphysematous changes are again seen. There is no edema or effusion. No focal airspace disease is present. No significant effusions are present. The visualized soft tissues and bony thorax are unremarkable. Surgical clips are present at the gallbladder fossa. IMPRESSION: 1. No acute cardiopulmonary disease or significant interval change. 2. Negative two view chest. Electronically Signed   By: Marin Roberts M.D.   On: 04/20/2016 14:16   Dg Lumbar Spine 2-3 Views  Result Date: 04/20/2016 CLINICAL DATA:  Pain following fall EXAM: LUMBAR SPINE - 2-3 VIEW COMPARISON:  None. FINDINGS: Frontal, lateral, and spot lumbosacral lateral images were obtained. There are 5 non-rib-bearing lumbar type vertebral bodies. There is thoracolumbar levoscoliosis. No fracture or spondylolisthesis is evident. There is moderately severe disc space narrowing at all levels in the lumbar region. No erosive change. There is atherosclerotic calcification in the aorta. IMPRESSION: Multifocal osteoarthritic change. Scoliosis. No fracture or spondylolisthesis. There is aortic atherosclerosis. Electronically Signed   By: Bretta Bang III M.D.   On: 04/20/2016 11:42   Ct Head Wo Contrast  Result Date: 04/20/2016 CLINICAL DATA:  Recent fall with increased confusion EXAM: CT HEAD WITHOUT CONTRAST TECHNIQUE: Contiguous axial  images were obtained from the base of the skull through the vertex without intravenous contrast. COMPARISON:  01/22/2013 FINDINGS: Brain: No acute territorial infarction, intracranial hemorrhage or focal mass lesion is present. No extra-axial fluid collection. Moderate atrophy. Ventricles similar size and morphology compared to prior. Mild periventricular white matter hypodensity, presumed small vessel disease. Vascular: No hyperdense vessels.  Carotid artery calcifications. Skull: Heterogeneous attenuation likely related to osteopenia. Right mastoid sclerosis. No fracture. Sinuses/Orbits: Mucosal opacification of the ethmoids and right maxillary sinus. No acute orbital abnormality. Other: None IMPRESSION: No CT evidence for acute intracranial abnormality. Electronically Signed   By: Jasmine Pang M.D.   On: 04/20/2016 18:39   Ct Angio Chest Pe W Or Wo Contrast  Result Date: 04/21/2016 CLINICAL DATA:  Chronic mild left-sided chest pain. Initial encounter. EXAM: CT ANGIOGRAPHY CHEST WITH CONTRAST TECHNIQUE: Multidetector CT imaging of the chest was performed using the standard protocol during bolus administration of intravenous  contrast. Multiplanar CT image reconstructions and MIPs were obtained to evaluate the vascular anatomy. CONTRAST:  50 mL of Isovue 370 IV contrast COMPARISON:  Chest radiograph performed earlier today at 1:51 p.m. FINDINGS: Cardiovascular:  There is no evidence of pulmonary embolus. Mild biatrial enlargement is noted. The heart is otherwise grossly unremarkable. Mild calcification is noted along the aortic arch. The great vessels are unremarkable in appearance. Mediastinum/Nodes: The mediastinum is otherwise grossly unremarkable. No mediastinal lymphadenopathy is seen. No pericardial effusion is identified. The thyroid gland is not well characterized. No dominant thyroid mass is seen. No axillary lymphadenopathy is appreciated. Lungs/Pleura: Minimal bibasilar atelectasis or scarring is  noted. The lungs are otherwise grossly clear. No pleural effusion or pneumothorax is seen. No masses are identified. Upper Abdomen: The visualized portions of the liver and spleen are unremarkable. The patient is status post cholecystectomy, with clips noted at the gallbladder fossa. The pancreas is difficult to fully assess. The visualized portions of the adrenal glands and left kidney are unremarkable. Musculoskeletal: No acute osseous abnormalities are identified. Multilevel vacuum phenomenon is noted along the lower thoracic and upper lumbar spine, with endplate sclerotic change and irregularity noted at L1-L2. The visualized musculature is unremarkable in appearance. Review of the MIP images confirms the above findings. IMPRESSION: 1. No evidence of pulmonary embolus. 2. Minimal bibasilar atelectasis or scarring noted. Lungs otherwise clear. 3. Mild biatrial enlargement noted. 4. Mild degenerative change along the lower thoracic and upper lumbar spine. Electronically Signed   By: Roanna Raider M.D.   On: 04/21/2016 00:23   Dg Foot Complete Left  Result Date: 04/20/2016 CLINICAL DATA:  Fall. EXAM: LEFT FOOT - COMPLETE 3+ VIEW COMPARISON:  01/08/2014. FINDINGS: Surgical can noted in the distal left first metatarsal. Diffuse degenerative change. Diffuse osteopenia. An acute fracture of the distal portion of the proximal phalanx of the left second toe is noted. Old healed fracture of the proximal phalanx of the left great toe. IMPRESSION: 1. Acute fracture of the distal aspect of the proximal phalanx of the left second toe. 2. Diffuse degenerative changes. Postsurgical changes first metatarsal. Electronically Signed   By: Maisie Fus  Register   On: 04/20/2016 11:41    Microbiology: Recent Results (from the past 240 hour(s))  Urine culture     Status: None   Collection Time: 04/20/16 11:33 AM  Result Value Ref Range Status   Organism ID, Bacteria NO GROWTH  Final  Urine culture     Status: None    Collection Time: 04/21/16  2:11 PM  Result Value Ref Range Status   Specimen Description URINE, RANDOM  Final   Special Requests NONE  Final   Culture NO GROWTH  Final   Report Status 04/22/2016 FINAL  Final     Labs: Basic Metabolic Panel:  Recent Labs Lab 04/20/16 1115 04/20/16 1335 04/21/16 0609 04/22/16 0908 04/23/16 0643  NA 140 138 140 141 139  K 3.8 3.7 3.5 3.7 3.8  CL 107 108 110 111 106  CO2 22 22 22 23 26   GLUCOSE 118* 136* 89 88 76  BUN 13 9 5* 10 7  CREATININE 0.62 0.57 0.51 0.52 0.60  CALCIUM 9.7 9.8 8.9 9.3 9.1  MG  --   --  1.8  --   --   PHOS  --   --  3.3  --   --    Liver Function Tests:  Recent Labs Lab 04/20/16 1115 04/21/16 0609  AST 30 31  ALT 22 22  ALKPHOS 119 109  BILITOT 0.3 0.7  PROT 7.1 6.4*  ALBUMIN 4.0 3.3*   No results for input(s): LIPASE, AMYLASE in the last 168 hours.  Recent Labs Lab 04/20/16 1712  AMMONIA 17   CBC:  Recent Labs Lab 04/20/16 1149 04/20/16 1335 04/21/16 0609  WBC 9.4 9.8 9.3  HGB 13.4 13.0 12.0  HCT 38.5 39.3 35.9*  MCV 86.0 87.7 87.3  PLT  --  346 319   Cardiac Enzymes:  Recent Labs Lab 04/20/16 2245 04/21/16 0609 04/21/16 1119  TROPONINI <0.03 <0.03 <0.03   BNP: BNP (last 3 results)  Recent Labs  04/20/16 1710  BNP 98.6    ProBNP (last 3 results) No results for input(s): PROBNP in the last 8760 hours.  CBG: No results for input(s): GLUCAP in the last 168 hours.     SignedRamiro Harvest:  Briant Angelillo MD.  [facility] 04/23/2016, 4:15 PM

## 2016-04-23 NOTE — Clinical Social Work Note (Signed)
Clinical Social Work Assessment  Patient Details  Name: Cheryl [name] MRN: [id number] Date of Birth: 10-Dec-1945  Date of referral:  04/23/16               Reason for consult:  Mental Health Concerns                Permission sought to share information with:  Family Supports Permission granted to share information::  Yes, Verbal Permission Granted  Name::     Armed forces operational officer::     Relationship::  dtr  Contact Information:     Housing/Transportation Living arrangements for the past 2 months:  Single Family Home Source of Information:  Patient Patient Interpreter Needed:  None Criminal Activity/Legal Involvement Pertinent to Current Situation/Hospitalization:  No - Comment as needed Significant Relationships:  Adult Children, Spouse, Other Family Members Lives with:  Spouse Do you feel safe going back to the place where you live?  Yes Need for family participation in patient care:  Yes (Comment)  Care giving concerns: Pt to return home with family.   Social Worker assessment / plan:  CSW received referral from MD who reports that pt is medically stable to DC and psych has cleared her for outpatient follow up.  CSW met with pt and sister Rosch) in room who report they are aware of medication changes and are agreeable to outpatient follow up. CSW provided list of psychiatric providers in [place]. Pt saw a psychiatrist about 2 years ago in [place] but is agreeable to have ongoing follow up now. CSW spoke with dtr via phone who will assist with making an appointment for pt.  CSW informed MD and is signing off but available if needed.  Employment status:  Disabled (Comment on whether or not currently receiving Disability) Insurance information:  Managed Care PT Recommendations:  Not assessed at this time Information / Referral to community resources:  Outpatient Psychiatric Care (Comment Required) (referral for psych f/u)  Patient/Family's Response to care:  Agreeable to outpatient  care.  Patient/Family's Understanding of and Emotional Response to Diagnosis, Current Treatment, and Prognosis:  Pt had rapid speech and had to be redirected throughout assessment. Family agreeable and will assist with care.  Emotional Assessment Appearance:  Appears older than stated age Attitude/Demeanor/Rapport:  Other (Rapid speech; anxious) Affect (typically observed):  Anxious Orientation:  Oriented to Self, Oriented to Place, Oriented to  Time, Oriented to Situation Alcohol / Substance use:  Not Applicable Psych involvement (Current and /or in the community):  Yes (Comment)  Discharge Needs  Concerns to be addressed:  No discharge needs identified Readmission within the last 30 days:  No Current discharge risk:  None Barriers to Discharge:  No Barriers Identified   Boone Master, [place] 04/23/2016, 4:43 PM Weekend Coverage

## 2016-04-25 ENCOUNTER — Telehealth: Payer: Self-pay

## 2016-04-25 NOTE — Telephone Encounter (Signed)
Mindi JunkerMarsha with [facility] called to let Dr Clelia CroftShaw know that she had just evaluated this patient and she wants to see her 2 times a week for 2 weeks and then 1 time a week for 2 weeks.  Marsha's number is  [phone] and again she was with [facility] and would like to speak to a nurse about continuing to see this patient.

## 2016-04-26 NOTE — Telephone Encounter (Signed)
Mindi JunkerMarsha called again to check on status of getting a call back.

## 2016-04-27 ENCOUNTER — Telehealth: Payer: Self-pay

## 2016-04-27 NOTE — Telephone Encounter (Signed)
Yes please

## 2016-04-27 NOTE — Telephone Encounter (Signed)
Physical therapist called on behalf of patient.  She is requesting verbal orders for treatment.  Please advise  Therapist phone: [phone]

## 2016-04-27 NOTE — Telephone Encounter (Signed)
See other note therapy verbally okd by Dr Clelia CroftShaw and relayed by me

## 2016-04-27 NOTE — Telephone Encounter (Signed)
[facility] advised.

## 2016-05-06 ENCOUNTER — Telehealth: Payer: Self-pay | Admitting: Family Medicine

## 2016-05-06 NOTE — Telephone Encounter (Signed)
I don't know what bp med this is in reference to?  What was the change?  Pt has not been in to the office yet since her hospitalization - if she could come in for a hosp f/u visit that'd be great.

## 2016-05-06 NOTE — Telephone Encounter (Signed)
Dr Ulice BrilliantShaw Beth from [facility] called and said that since the change in BP medicine pt is having light heading ness when she sit or stand please respond

## 2016-05-07 ENCOUNTER — Other Ambulatory Visit: Payer: Self-pay | Admitting: Family Medicine

## 2016-05-07 ENCOUNTER — Other Ambulatory Visit: Payer: Self-pay | Admitting: Physician Assistant

## 2016-05-07 DIAGNOSIS — R0600 Dyspnea, unspecified: Secondary | ICD-10-CM

## 2016-05-07 DIAGNOSIS — I10 Essential (primary) hypertension: Secondary | ICD-10-CM

## 2016-05-10 NOTE — Telephone Encounter (Signed)
Pt advised that she has an appt [facility]/new PCP, but stated that her PT thinks that her BP meds need to be adjusted. I advised pt that we can not adjust meds over the phone, would need eval, but will be happy to see her to eval her meds. Asked her to bring copy of meds [facility]

## 2016-05-16 ENCOUNTER — Ambulatory Visit: Payer: BLUE CROSS/BLUE SHIELD

## 2016-05-17 ENCOUNTER — Telehealth: Payer: Self-pay

## 2016-05-17 NOTE — Telephone Encounter (Signed)
[facility]

## 2016-05-20 ENCOUNTER — Ambulatory Visit (INDEPENDENT_AMBULATORY_CARE_PROVIDER_SITE_OTHER): Payer: BLUE CROSS/BLUE SHIELD | Admitting: Adult Health

## 2016-05-20 ENCOUNTER — Encounter: Payer: Self-pay | Admitting: Adult Health

## 2016-05-20 DIAGNOSIS — J4530 Mild persistent asthma, uncomplicated: Secondary | ICD-10-CM | POA: Diagnosis not present

## 2016-05-20 NOTE — Patient Instructions (Signed)
OK to Decrease advair to 250/50 1 puff twice daily Follow up Dr. Vassie LollAlva  In 6 months and As needed

## 2016-05-20 NOTE — Progress Notes (Signed)
Subjective:    Patient ID: Cheryl [name], female    DOB: 06/04/1946, 70 y.o.   MRN: [id number]  HPIPrimary Provider : [facility], Deatra JamesVyvyan Sun  70 year old with persistent asthma -Never smoker.  She describes asthma since puberty, last prednisone use 5 -6 yrs ago, prednisone use in the past was complicated by duodenal ulcer perforation.  advair 500/50 has worked well, CXR 4/07 showed hyperinlation  05/09/08 >> Rxed for bronchitis with zpak .   12/09>>spirometry on advair 500/50 >> moderate reversible airway obstruction c/w inadequately tretaed asthma, singulair added  1/10 exacerbation requiring steroids   October 05, 2009--flare needing steroids  Spirometry >> mod severe airway obstruction - fev1 51%   05/20/2016  Follow up : Asthma  Pt returns for 6 month follow up for Asthma .  She says she is doing well with breathing . No flare of cough or wheezing  No SABA use. Remains on Advair 500.  Of note she is on an ACE inhibitor, denies cough .   Past Medical History:  Diagnosis Date  . Abnormal heart rhythm   . Allergic rhinitis   . Arthritis    "right hip" (12/17/2015)  . Asthma   . Cardiomegaly   . Duodenal ulcer   . Emphysema   . Fall 03/2016  . Family history of anesthesia complication    TWIN SISTER HAS DELAYED REACTION -with "caine" meds  . Heart murmur   . Hyperlipidemia   . Hypertension   . Hypothyroidism    SEVERE   . Mitral valve regurgitation   . Osteoporosis   . Partial deafness   . Pneumonia   . Shortness of breath    Current Outpatient Prescriptions on File Prior to Visit  Medication Sig Dispense Refill  . acetaminophen (TYLENOL) 500 MG tablet Take 1-2 tablets (500-1,000 mg total) by mouth every 6 (six) hours as needed for mild pain or moderate pain. 60 tablet 0  . albuterol (PROAIR HFA) 108 (90 Base) MCG/ACT inhaler Inhale 2 puffs into the lungs every 4 (four) hours as needed for wheezing or shortness of breath. 18 g 5  . amLODipine-benazepril (LOTREL) 5-20  MG capsule Take 1 capsule by mouth every evening. 90 capsule 1  . aspirin EC 81 MG tablet Take 324 mg by mouth once.    . bimatoprost (LUMIGAN) 0.03 % ophthalmic solution Place 1 drop into both eyes at bedtime.     . Fluticasone-Salmeterol (ADVAIR DISKUS) 500-50 MCG/DOSE AEPB Inhale 1 puff into the lungs 2 (two) times daily. 60 each 5  . levothyroxine (SYNTHROID) 88 MCG tablet Take 1 tablet (88 mcg total) by mouth daily before breakfast. 90 tablet 1  . oxybutynin (DITROPAN) 5 MG tablet Take 1 tablet (5 mg total) by mouth 3 (three) times daily. 90 tablet 0  . QUEtiapine (SEROQUEL) 25 MG tablet Take 1 tablet (25 mg total) by mouth at bedtime. 30 tablet 1   No current facility-administered medications on file prior to visit.         Review of SystemsConstitutional:   No  weight loss, night sweats,  Fevers, chills, fatigue, or  lassitude.  HEENT:   No headaches,  Difficulty swallowing,  Tooth/dental problems, or  Sore throat,                No sneezing, itching, ear ache, nasal congestion, post nasal drip,   CV:  No chest pain,  Orthopnea, PND, swelling in lower extremities, anasarca, dizziness, palpitations, syncope.   GI  No  heartburn, indigestion, abdominal pain, nausea, vomiting, diarrhea, change in bowel habits, loss of appetite, bloody stools.   Resp:  No coughing up of blood. [name].  No chest wall deformity  Skin: no rash or lesions.  GU: no dysuria, change in color of urine, no urgency or frequency.  No flank pain, no hematuria   MS:  No joint pain or swelling.  No decreased range of motion.  No back pain.  Psych:  No change in mood or affect. No depression or anxiety.  No memory loss.         Objective:   Physical Exam   . Vitals:   05/20/16 1520  BP: 130/70  Pulse: 68  Temp: 98.7 F (37.1 C)  TempSrc: Oral  SpO2: 95%  Weight: 104 lb (47.2 kg)  Height: 4\' 11"  (1.499 m)    GEN: A/Ox3; pleasant , NAD, thin   HEENT:  [place]/AT,  EACs-clear, TMs-wnl, NOSE-clear,  THROAT-clear, no lesions, no postnasal drip or exudate noted. ,poor dentition  NECK:  Supple w/ fair ROM; no JVD; normal carotid impulses w/o bruits; no thyromegaly or nodules palpated; no lymphadenopathy.    RESP  CTA   w/o, wheezes/ rales/ or rhonchi. no accessory muscle use, no dullness to percussion  CARD:  RRR, no m/r/g  , no peripheral edema, pulses intact, no cyanosis or clubbing.  GI:   Soft & nt; nml bowel sounds; no organomegaly or masses detected.   Musco: Warm bil, no deformities or joint swelling noted.   Neuro: alert, no focal deficits noted.    Skin: Warm, no lesions or rashes  Tammy Parrett NP-C  [facility]  05/20/2016

## 2016-05-20 NOTE — Assessment & Plan Note (Signed)
Well controlled on Adair  Decrease dose   Plan  Patient Instructions  OK to Decrease advair to 250/50 1 puff twice daily Follow up Dr. Vassie LollAlva  In 6 months and As needed

## 2016-05-21 NOTE — Telephone Encounter (Signed)
I can not find where we referred to [facility], who referred? It is Saturday, someone will need to call next week and check on this.

## 2016-05-24 NOTE — Telephone Encounter (Signed)
[facility] Georgette ShellSara Spencer. IC Marsha at [facility] to advise.

## 2016-05-26 ENCOUNTER — Encounter (INDEPENDENT_AMBULATORY_CARE_PROVIDER_SITE_OTHER): Payer: Self-pay | Admitting: Orthopaedic Surgery

## 2016-05-26 ENCOUNTER — Ambulatory Visit (INDEPENDENT_AMBULATORY_CARE_PROVIDER_SITE_OTHER): Payer: BLUE CROSS/BLUE SHIELD | Admitting: Orthopaedic Surgery

## 2016-05-26 DIAGNOSIS — M79675 Pain in left toe(s): Secondary | ICD-10-CM | POA: Diagnosis not present

## 2016-05-26 DIAGNOSIS — Z96641 Presence of right artificial hip joint: Secondary | ICD-10-CM | POA: Diagnosis not present

## 2016-05-26 NOTE — Progress Notes (Signed)
Office Visit Note   Patient: Cheryl [name]           Date of Birth: 01/08/1946           MRN: [id number] Visit Date: 05/26/2016              Requested by: [name], MD [address] [place], [place] [address] PCP: Norberto SorensonSHAW,EVA, MD   Assessment & Plan: Visit Diagnoses:  1. History of right hip replacement   2. Toe pain, left     Plan: I reviewed x-rays which showed a nondisplaced fracture of the proximal phalanx of the second toe from November 1. At this point no x-rays are indicated she is clinically doing very well. She is walking with normal shoes. I'll see her back in 6 months for her 1 year visit for her right total hip replacement with low AP pelvis x-ray  Follow-Up Instructions: Return in about 6 months (around 11/24/2016) for recheck right THA.   Orders:  No orders of the defined types were placed in this encounter.  No orders of the defined types were placed in this encounter.     Procedures: No procedures performed   Clinical Data: No additional findings.   Subjective: Chief Complaint  Patient presents with  . Left Foot - Pain    Patient comes in with new complaint of left second toe fracture from about 5-6 weeks ago. She is feeling much better now. She is ambulatory with a cane but she is otherwise doing well. She denies any symptoms with her right hip replacement which is about 5-6 months ago.    Review of Systems   Objective: Vital Signs: There were no vitals taken for this visit.  Physical Exam  Ortho Exam Exam of the left second toe shows no significant swelling or tenderness to palpation. Alignment is normal. Specialty Comments:  No specialty comments available.  Imaging: No results found.   PMFS History: Patient Active Problem List   Diagnosis Date Noted  . Toe pain, left 05/26/2016  . Urinary frequency 04/23/2016  . Urinary urgency 04/23/2016  . Chest pain   . Acute encephalopathy 04/20/2016  . Altered mental status 04/20/2016  .  Manic state (HCC) 04/20/2016  . Abnormal heart rhythm 01/16/2016  . History of right hip replacement 12/16/2015  . Prediabetes 04/02/2015  . Osteoporosis   . Mood disorder in conditions classified elsewhere 02/05/2013  . Severe hypothyroidism 01/24/2013  . 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 Hyperlipidemia 01/24/2013  . Abnormal LFTs 03/10/2011  . 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 BP (high blood pressure) 03/04/2011  . Adult hypothyroidism 03/04/2011  . Atrial enlargement, left 03/04/2011  . Left ventricular hypertrophy 03/04/2011  . Billowing mitral valve 03/04/2011  . Pulmonary hypertension 03/04/2011  . Pain in right hip 03/04/2011  . Disease of supporting structures of teeth 10/28/2010  . OP (osteoporosis) 09/01/2010  . Abnormal blood sugar 09/01/2010  . HLD (hyperlipidemia) 09/01/2010  . Essential (primary) hypertension 09/01/2010  . Avitaminosis D 09/01/2010  . Bipolar I disorder, single manic episode (HCC) 04/21/2010  . Difficulty hearing 08/17/2009  . Disorder of mitral valve 05/22/2009  . CANDIDIASIS, ORAL 03/06/2009  . Essential hypertension 05/09/2008  . ALLERGIC RHINITIS 05/09/2008  . Asthma 05/09/2008   Past Medical History:  Diagnosis Date  . Abnormal heart rhythm   . Allergic rhinitis   . Arthritis    "right hip" (12/17/2015)  . Asthma   . Cardiomegaly   .  Duodenal ulcer   . Emphysema   . Fall 03/2016  . Family history of anesthesia complication    TWIN SISTER HAS DELAYED REACTION -with "caine" meds  . Heart murmur   . Hyperlipidemia   . Hypertension   . Hypothyroidism    SEVERE   . Mitral valve regurgitation   . Osteoporosis   . Partial deafness   . Pneumonia   . Shortness of breath     Family History  Problem Relation Age of Onset  . Heart disease Mother   . Clotting disorder Mother   . Cancer Mother   . Allergies Sister     X2  . Osteoporosis Maternal Grandmother     Past Surgical History:  Procedure  Laterality Date  . ABDOMINAL HYSTERECTOMY    . CATARACT EXTRACTION W/ INTRAOCULAR LENS  IMPLANT, BILATERAL Bilateral ~ 2015  . CHOLECYSTECTOMY OPEN  ?1980s  . CLEFT PALATE REPAIR  1949  . INNER EAR SURGERY Bilateral   . JOINT REPLACEMENT    . REPAIR OF PERFORATED ULCER  ?1980s   "duodenal"  . THYROIDECTOMY  2000s  . TONSILLECTOMY AND ADENOIDECTOMY  1950s  . TOTAL HIP ARTHROPLASTY Right 12/16/2015  . TOTAL HIP ARTHROPLASTY Right 12/16/2015   Procedure: RIGHT TOTAL HIP ARTHROPLASTY ANTERIOR APPROACH;  Surgeon: Tarry KosNaiping M Xu, MD;  Location: [facility] OR;  Service: Orthopedics;  Laterality: Right;  . TUBAL LIGATION     Social History   Occupational History  . Not on file.   Social History Main Topics  . Smoking status: Passive Smoke Exposure - Never Smoker  . Smokeless tobacco: Never Used     Comment: EXPOSED TO SECOND HAND SMOKE AS CHILD  . Alcohol use No  . Drug use: No  . Sexual activity: Yes    Birth control/ protection: Condom

## 2016-06-04 ENCOUNTER — Other Ambulatory Visit: Payer: Self-pay | Admitting: Family Medicine

## 2016-06-04 ENCOUNTER — Other Ambulatory Visit: Payer: Self-pay | Admitting: Physician Assistant

## 2016-06-04 DIAGNOSIS — J4530 Mild persistent asthma, uncomplicated: Secondary | ICD-10-CM

## 2016-06-04 DIAGNOSIS — E039 Hypothyroidism, unspecified: Secondary | ICD-10-CM

## 2016-06-04 DIAGNOSIS — R0600 Dyspnea, unspecified: Secondary | ICD-10-CM

## 2016-06-07 NOTE — Telephone Encounter (Signed)
04/20/16 last nl tsh

## 2016-06-07 NOTE — Telephone Encounter (Signed)
Last ov 04/2016

## 2016-06-25 NOTE — Progress Notes (Signed)
Reviewed & agree with plan  

## 2016-07-01 ENCOUNTER — Ambulatory Visit (INDEPENDENT_AMBULATORY_CARE_PROVIDER_SITE_OTHER): Payer: BLUE CROSS/BLUE SHIELD | Admitting: Orthopaedic Surgery

## 2016-08-02 ENCOUNTER — Other Ambulatory Visit: Payer: Self-pay | Admitting: Family Medicine

## 2016-09-11 ENCOUNTER — Other Ambulatory Visit: Payer: Self-pay | Admitting: Family Medicine

## 2016-09-11 DIAGNOSIS — R0600 Dyspnea, unspecified: Secondary | ICD-10-CM

## 2016-09-11 DIAGNOSIS — I10 Essential (primary) hypertension: Secondary | ICD-10-CM

## 2016-09-11 DIAGNOSIS — J4530 Mild persistent asthma, uncomplicated: Secondary | ICD-10-CM

## 2016-09-12 NOTE — Telephone Encounter (Signed)
Refill requests denied, as patient transferred care to Cheryl [name] ([facility]) 05/17/2016

## 2016-11-24 ENCOUNTER — Ambulatory Visit (INDEPENDENT_AMBULATORY_CARE_PROVIDER_SITE_OTHER): Payer: Self-pay

## 2016-11-24 ENCOUNTER — Ambulatory Visit (INDEPENDENT_AMBULATORY_CARE_PROVIDER_SITE_OTHER): Payer: BLUE CROSS/BLUE SHIELD | Admitting: Orthopaedic Surgery

## 2016-11-24 DIAGNOSIS — Z96641 Presence of right artificial hip joint: Secondary | ICD-10-CM

## 2016-11-24 DIAGNOSIS — M25551 Pain in right hip: Secondary | ICD-10-CM | POA: Diagnosis not present

## 2016-11-24 NOTE — Progress Notes (Signed)
Office Visit Note   Patient: Cheryl DarbyDonna E Burkard           Date of Birth: 09/28/1945           MRN: [id number] Visit Date: 11/24/2016              Requested by: Sherren MochaShaw, Eva N, MD [address] [place], [place] [id number] PCP: Teena IraniSpencer, Sara C, PA-C   Assessment & Plan: Visit Diagnoses:  1. History of right hip replacement     Plan: Patient is doing well for her 1 year visit. I reminded her of antibiotic prophylaxis. Follow-up in a year with AP pelvis  Follow-Up Instructions: Return in about 1 year (around 11/24/2017).   Orders:  Orders Placed This Encounter  Procedures  . XR Pelvis 1-2 Views   No orders of the defined types were placed in this encounter.     Procedures: No procedures performed   Clinical Data: No additional findings.   Subjective: Chief Complaint  Patient presents with  . Right Hip - Pain    Patient is one year status post right total hip replacement. She is doing well. She denies any complaints.    Review of Systems   Objective: Vital Signs: There were no vitals taken for this visit.  Physical Exam  Ortho Exam Right hip exam is benign. Surgical scar is fully healed. Specialty Comments:  No specialty comments available.  Imaging: Xr Pelvis 1-2 Views  Result Date: 11/24/2016 Stable right total hip replacement    PMFS History: Patient Active Problem List   Diagnosis Date Noted  . Toe pain, left 05/26/2016  . Urinary frequency 04/23/2016  . Urinary urgency 04/23/2016  . Chest pain   . Acute encephalopathy 04/20/2016  . Altered mental status 04/20/2016  . Manic state (HCC) 04/20/2016  . Abnormal heart rhythm 01/16/2016  . History of right hip replacement 12/16/2015  . Prediabetes 04/02/2015  . Osteoporosis   . Mood disorder in conditions classified elsewhere 02/05/2013  . Severe hypothyroidism 01/24/2013  . 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 Hyperlipidemia 01/24/2013  . Abnormal LFTs 03/10/2011  . Congenital inequality of  length of lower extremity 03/04/2011  . BP (high blood pressure) 03/04/2011  . Adult hypothyroidism 03/04/2011  . Atrial enlargement, left 03/04/2011  . Left ventricular hypertrophy 03/04/2011  . Billowing mitral valve 03/04/2011  . Pulmonary hypertension (HCC) 03/04/2011  . Pain in right hip 03/04/2011  . Disease of supporting structures of teeth 10/28/2010  . OP (osteoporosis) 09/01/2010  . Abnormal blood sugar 09/01/2010  . HLD (hyperlipidemia) 09/01/2010  . Essential (primary) hypertension 09/01/2010  . Avitaminosis D 09/01/2010  . Bipolar I disorder, single manic episode (HCC) 04/21/2010  . Difficulty hearing 08/17/2009  . Disorder of mitral valve 05/22/2009  . CANDIDIASIS, ORAL 03/06/2009  . Essential hypertension 05/09/2008  . ALLERGIC RHINITIS 05/09/2008  . Asthma 05/09/2008   Past Medical History:  Diagnosis Date  . Abnormal heart rhythm   . Allergic rhinitis   . Arthritis    "right hip" (12/17/2015)  . Asthma   . Cardiomegaly   . Duodenal ulcer   . Emphysema   . Fall 03/2016  . Family history of anesthesia complication    TWIN SISTER HAS DELAYED REACTION -with "caine" meds  . Heart murmur   . Hyperlipidemia   . Hypertension   . Hypothyroidism    SEVERE   . Mitral valve regurgitation   . Osteoporosis   . Partial deafness   . Pneumonia   .  Shortness of breath     Family History  Problem Relation Age of Onset  . Heart disease Mother   . Clotting disorder Mother   . Cancer Mother   . Allergies Sister        X2  . Osteoporosis Maternal Grandmother     Past Surgical History:  Procedure Laterality Date  . ABDOMINAL HYSTERECTOMY    . CATARACT EXTRACTION W/ INTRAOCULAR LENS  IMPLANT, BILATERAL Bilateral ~ 2015  . CHOLECYSTECTOMY OPEN  ?1980s  . CLEFT PALATE REPAIR  1949  . INNER EAR SURGERY Bilateral   . JOINT REPLACEMENT    . REPAIR OF PERFORATED ULCER  ?1980s   "duodenal"  . THYROIDECTOMY  2000s  . TONSILLECTOMY AND ADENOIDECTOMY  1950s  . TOTAL  HIP ARTHROPLASTY Right 12/16/2015  . TOTAL HIP ARTHROPLASTY Right 12/16/2015   Procedure: RIGHT TOTAL HIP ARTHROPLASTY ANTERIOR APPROACH;  Surgeon: Tarry Kos, MD;  Location: [facility] OR;  Service: Orthopedics;  Laterality: Right;  . TUBAL LIGATION     Social History   Occupational History  . Not on file.   Social History Main Topics  . Smoking status: Passive Smoke Exposure - Never Smoker  . Smokeless tobacco: Never Used     Comment: EXPOSED TO SECOND HAND SMOKE AS CHILD  . Alcohol use No  . Drug use: No  . Sexual activity: Yes    Birth control/ protection: Condom

## 2016-12-01 ENCOUNTER — Ambulatory Visit (INDEPENDENT_AMBULATORY_CARE_PROVIDER_SITE_OTHER): Payer: BLUE CROSS/BLUE SHIELD

## 2016-12-01 ENCOUNTER — Ambulatory Visit (INDEPENDENT_AMBULATORY_CARE_PROVIDER_SITE_OTHER): Payer: BLUE CROSS/BLUE SHIELD | Admitting: Orthopaedic Surgery

## 2016-12-01 DIAGNOSIS — M79671 Pain in right foot: Secondary | ICD-10-CM

## 2016-12-01 DIAGNOSIS — S92344A Nondisplaced fracture of fourth metatarsal bone, right foot, initial encounter for closed fracture: Secondary | ICD-10-CM

## 2016-12-01 DIAGNOSIS — R0781 Pleurodynia: Secondary | ICD-10-CM

## 2016-12-01 DIAGNOSIS — S92334A Nondisplaced fracture of third metatarsal bone, right foot, initial encounter for closed fracture: Secondary | ICD-10-CM

## 2016-12-01 DIAGNOSIS — S92324A Nondisplaced fracture of second metatarsal bone, right foot, initial encounter for closed fracture: Secondary | ICD-10-CM | POA: Diagnosis not present

## 2016-12-01 NOTE — Progress Notes (Signed)
Office Visit Note   Patient: Cheryl [name]           Date of Birth: 07/02/1945           MRN: [id number] Visit Date: 12/01/2016              Requested by: Teena IraniSpencer, Sara C, PA-C [address] [place], [place] [address] PCP: Teena IraniSpencer, Sara C, PA-C   Assessment & Plan: Visit Diagnoses:  1. Right foot pain   2. Rib pain on right side     Plan: Metatarsal fractures. Postop shoe weight-bear as tolerated. Symptomatically treatment for contused ribs. Follow-up as needed. Wean postop shoe as needed.  Follow-Up Instructions: Return if symptoms worsen or fail to improve.   Orders:  Orders Placed This Encounter  Procedures  . XR Foot Complete Right  . XR Ribs Unilateral Right   No orders of the defined types were placed in this encounter.     Procedures: No procedures performed   Clinical Data: No additional findings.   Subjective: No chief complaint on file.   Cheryl [name] comes in today for acute right foot pain and right rib pain. She had a fall 2 days ago and has had trouble weightbearing and having discomfort on the right side of her ribs. She denies any chest pain or shortness of breath or difficulty speaking she is having difficulty with ambulating with weightbearing.    Review of Systems  Constitutional: Negative.   HENT: Negative.   Eyes: Negative.   Respiratory: Negative.   Cardiovascular: Negative.   Endocrine: Negative.   Musculoskeletal: Negative.   Neurological: Negative.   Hematological: Negative.   Psychiatric/Behavioral: Negative.   All other systems reviewed and are negative.    Objective: Vital Signs: There were no vitals taken for this visit.  Physical Exam  Constitutional: She is oriented to person, place, and time. She appears well-developed and well-nourished.  Pulmonary/Chest: Effort normal.  Neurological: She is alert and oriented to person, place, and time.  Skin: Skin is warm. Capillary refill takes less than 2 seconds.  Psychiatric:  She has a normal mood and affect. Her behavior is normal. Judgment and thought content normal.  Nursing note and vitals reviewed.   Ortho Exam Right chest exam shows nonlabored breathing. Her right ribs are tender but there is no swelling or ecchymosis. There is no crepitus. Right foot exam shows mild bruising and swelling of her forefoot. Her metatarsal heads are tender to palpation. Specialty Comments:  No specialty comments available.  Imaging: Xr Ribs Unilateral Right  Result Date: 12/01/2016 Negative  Xr Foot Complete Right  Result Date: 12/01/2016 Acute fractures of her second third and fifth metatarsal necks    PMFS History: Patient Active Problem List   Diagnosis Date Noted  . Right foot pain 12/01/2016  . Rib pain on right side 12/01/2016  . Toe pain, left 05/26/2016  . Urinary frequency 04/23/2016  . Urinary urgency 04/23/2016  . Chest pain   . Acute encephalopathy 04/20/2016  . Altered mental status 04/20/2016  . Manic state (HCC) 04/20/2016  . Abnormal heart rhythm 01/16/2016  . History of right hip replacement 12/16/2015  . Prediabetes 04/02/2015  . Osteoporosis   . Mood disorder in conditions classified elsewhere 02/05/2013  . Severe hypothyroidism 01/24/2013  . 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 Hyperlipidemia 01/24/2013  . Abnormal LFTs 03/10/2011  . 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 BP (high blood pressure) 03/04/2011  . Adult hypothyroidism 03/04/2011  .  Atrial enlargement, left 03/04/2011  . Left ventricular hypertrophy 03/04/2011  . Billowing mitral valve 03/04/2011  . Pulmonary hypertension (HCC) 03/04/2011  . Pain in right hip 03/04/2011  . Disease of supporting structures of teeth 10/28/2010  . OP (osteoporosis) 09/01/2010  . Abnormal blood sugar 09/01/2010  . HLD (hyperlipidemia) 09/01/2010  . Essential (primary) hypertension 09/01/2010  . Avitaminosis D 09/01/2010  . Bipolar I disorder, single manic  episode (HCC) 04/21/2010  . Difficulty hearing 08/17/2009  . Disorder of mitral valve 05/22/2009  . CANDIDIASIS, ORAL 03/06/2009  . Essential hypertension 05/09/2008  . ALLERGIC RHINITIS 05/09/2008  . Asthma 05/09/2008   Past Medical History:  Diagnosis Date  . Abnormal heart rhythm   . Allergic rhinitis   . Arthritis    "right hip" (12/17/2015)  . Asthma   . Cardiomegaly   . Duodenal ulcer   . Emphysema   . Fall 03/2016  . Family history of anesthesia complication    TWIN SISTER HAS DELAYED REACTION -with "caine" meds  . Heart murmur   . Hyperlipidemia   . Hypertension   . Hypothyroidism    SEVERE   . Mitral valve regurgitation   . Osteoporosis   . Partial deafness   . Pneumonia   . Shortness of breath     Family History  Problem Relation Age of Onset  . Heart disease Mother   . Clotting disorder Mother   . Cancer Mother   . Allergies Sister        X2  . Osteoporosis Maternal Grandmother     Past Surgical History:  Procedure Laterality Date  . ABDOMINAL HYSTERECTOMY    . CATARACT EXTRACTION W/ INTRAOCULAR LENS  IMPLANT, BILATERAL Bilateral ~ 2015  . CHOLECYSTECTOMY OPEN  ?1980s  . CLEFT PALATE REPAIR  1949  . INNER EAR SURGERY Bilateral   . JOINT REPLACEMENT    . REPAIR OF PERFORATED ULCER  ?1980s   "duodenal"  . THYROIDECTOMY  2000s  . TONSILLECTOMY AND ADENOIDECTOMY  1950s  . TOTAL HIP ARTHROPLASTY Right 12/16/2015  . TOTAL HIP ARTHROPLASTY Right 12/16/2015   Procedure: RIGHT TOTAL HIP ARTHROPLASTY ANTERIOR APPROACH;  Surgeon: Tarry Kos, MD;  Location: [facility];  Service: Orthopedics;  Laterality: Right;  . TUBAL LIGATION     Social History   Occupational History  . Not on file.   Social History Main Topics  . Smoking status: Passive Smoke Exposure - Never Smoker  . Smokeless tobacco: Never Used     Comment: EXPOSED TO SECOND HAND SMOKE AS CHILD  . Alcohol use No  . Drug use: No  . Sexual activity: Yes    Birth control/ protection: Condom

## 2016-12-08 ENCOUNTER — Emergency Department (HOSPITAL_COMMUNITY)
Admission: EM | Admit: 2016-12-08 | Discharge: 2016-12-08 | Disposition: A | Discharge status: Home / Self Care | Payer: BLUE CROSS/BLUE SHIELD | Attending: Emergency Medicine | Admitting: Emergency Medicine

## 2016-12-08 ENCOUNTER — Encounter (HOSPITAL_COMMUNITY): Payer: Self-pay

## 2016-12-08 DIAGNOSIS — I10 Essential (primary) hypertension: Secondary | ICD-10-CM | POA: Insufficient documentation

## 2016-12-08 DIAGNOSIS — E039 Hypothyroidism, unspecified: Secondary | ICD-10-CM | POA: Insufficient documentation

## 2016-12-08 DIAGNOSIS — Z96641 Presence of right artificial hip joint: Secondary | ICD-10-CM | POA: Diagnosis not present

## 2016-12-08 DIAGNOSIS — Y939 Activity, unspecified: Secondary | ICD-10-CM | POA: Insufficient documentation

## 2016-12-08 DIAGNOSIS — Y929 Unspecified place or not applicable: Secondary | ICD-10-CM | POA: Insufficient documentation

## 2016-12-08 DIAGNOSIS — W208XXA Other cause of strike by thrown, projected or falling object, initial encounter: Secondary | ICD-10-CM | POA: Diagnosis not present

## 2016-12-08 DIAGNOSIS — Y999 Unspecified external cause status: Secondary | ICD-10-CM | POA: Diagnosis not present

## 2016-12-08 DIAGNOSIS — J45909 Unspecified asthma, uncomplicated: Secondary | ICD-10-CM | POA: Insufficient documentation

## 2016-12-08 DIAGNOSIS — S060X9A Concussion with loss of consciousness of unspecified duration, initial encounter: Secondary | ICD-10-CM | POA: Diagnosis not present

## 2016-12-08 DIAGNOSIS — Z79899 Other long term (current) drug therapy: Secondary | ICD-10-CM | POA: Insufficient documentation

## 2016-12-08 DIAGNOSIS — Z7722 Contact with and (suspected) exposure to environmental tobacco smoke (acute) (chronic): Secondary | ICD-10-CM | POA: Diagnosis not present

## 2016-12-08 DIAGNOSIS — M79671 Pain in right foot: Secondary | ICD-10-CM | POA: Insufficient documentation

## 2016-12-08 DIAGNOSIS — S069X9A Unspecified intracranial injury with loss of consciousness of unspecified duration, initial encounter: Secondary | ICD-10-CM | POA: Diagnosis present

## 2016-12-08 MED ORDER — MECLIZINE HCL 25 MG PO TABS: 25.0000 mg | ORAL_TABLET | Freq: Three times a day (TID) | ORAL | 0 refills | Status: AC | PRN

## 2016-12-08 NOTE — ED Provider Notes (Signed)
[facility]-EMERGENCY DEPT Provider Note   CSN: [id number] Arrival date & time: 12/08/16  1031     History   Chief Complaint Chief Complaint  Patient presents with  . Fall  . Head Injury  . Blurred Vision  . Near Syncope    HPI Cheryl [name] is a 71 y.o. female.  71 yo F with a cc of a fall.  This occurred about 2 weeks ago. The patient was walking she did not know that they were changing the vents in the house, and she feel down and the cover bumped her head.  She denied LOC. Since then she's had some lightheadedness and unsteadiness upon turning her head. She also been having some headaches. Denies unilateral weakness or numbness. No change in gait. She has also injured her foot in this fall in his artery been seen by orthopedics and diagnosed with a hairline fracture.   The history is provided by the patient.  Fall  This is a new problem. The current episode started less than 1 hour ago. The problem occurs constantly. The problem has not changed since onset.Associated symptoms include headaches. Pertinent negatives include no chest pain and no shortness of breath. Nothing aggravates the symptoms. Nothing relieves the symptoms. She has tried nothing for the symptoms. The treatment provided no relief.  Head Injury   Pertinent negatives include no vomiting.  Near Syncope  Associated symptoms include headaches. Pertinent negatives include no chest pain and no shortness of breath.    Past Medical History:  Diagnosis Date  . Abnormal heart rhythm   . Allergic rhinitis   . Arthritis    "right hip" (12/17/2015)  . Asthma   . Cardiomegaly   . Duodenal ulcer   . Emphysema   . Fall 03/2016  . Family history of anesthesia complication    TWIN SISTER HAS DELAYED REACTION -with "caine" meds  . Heart murmur   . Hyperlipidemia   . Hypertension   . Hypothyroidism    SEVERE   . Mitral valve regurgitation   . Osteoporosis   . Partial deafness   . Pneumonia   . Shortness of breath      Patient Active Problem List   Diagnosis Date Noted  . Right foot pain 12/01/2016  . Rib pain on right side 12/01/2016  . Toe pain, left 05/26/2016  . Urinary frequency 04/23/2016  . Urinary urgency 04/23/2016  . Chest pain   . Acute encephalopathy 04/20/2016  . Altered mental status 04/20/2016  . Manic state (HCC) 04/20/2016  . Abnormal heart rhythm 01/16/2016  . History of right hip replacement 12/16/2015  . Prediabetes 04/02/2015  . Osteoporosis   . Mood disorder in conditions classified elsewhere 02/05/2013  . Severe hypothyroidism 01/24/2013  . 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 Hyperlipidemia 01/24/2013  . Abnormal LFTs 03/10/2011  . 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 BP (high blood pressure) 03/04/2011  . Adult hypothyroidism 03/04/2011  . Atrial enlargement, left 03/04/2011  . Left ventricular hypertrophy 03/04/2011  . Billowing mitral valve 03/04/2011  . Pulmonary hypertension (HCC) 03/04/2011  . Pain in right hip 03/04/2011  . Disease of supporting structures of teeth 10/28/2010  . OP (osteoporosis) 09/01/2010  . Abnormal blood sugar 09/01/2010  . HLD (hyperlipidemia) 09/01/2010  . Essential (primary) hypertension 09/01/2010  . Avitaminosis D 09/01/2010  . Bipolar I disorder, single manic episode (HCC) 04/21/2010  . Difficulty hearing 08/17/2009  . Disorder of mitral valve 05/22/2009  . CANDIDIASIS, ORAL 03/06/2009  .  Essential hypertension 05/09/2008  . ALLERGIC RHINITIS 05/09/2008  . Asthma 05/09/2008    Past Surgical History:  Procedure Laterality Date  . ABDOMINAL HYSTERECTOMY    . CATARACT EXTRACTION W/ INTRAOCULAR LENS  IMPLANT, BILATERAL Bilateral ~ 2015  . CHOLECYSTECTOMY OPEN  ?1980s  . CLEFT PALATE REPAIR  1949  . INNER EAR SURGERY Bilateral   . JOINT REPLACEMENT    . REPAIR OF PERFORATED ULCER  ?1980s   "duodenal"  . THYROIDECTOMY  2000s  . TONSILLECTOMY AND ADENOIDECTOMY  1950s  . TOTAL HIP ARTHROPLASTY  Right 12/16/2015  . TOTAL HIP ARTHROPLASTY Right 12/16/2015   Procedure: RIGHT TOTAL HIP ARTHROPLASTY ANTERIOR APPROACH;  Surgeon: Tarry Kos, MD;  Location: [facility] OR;  Service: Orthopedics;  Laterality: Right;  . TUBAL LIGATION      OB History    No data available       Home Medications    Prior to Admission medications   Medication Sig Start Date End Date Taking? Authorizing Provider  acetaminophen (TYLENOL) 500 MG tablet Take 1-2 tablets (500-1,000 mg total) by mouth every 6 (six) hours as needed for mild pain or moderate pain. 12/16/15  Yes Tarry Kos, MD  ADVAIR DISKUS 500-50 MCG/DOSE AEPB INHALE 1 PUFF INTO THE LUNGS TWICE DAILY 06/07/16  Yes [name], MD  amLODipine-benazepril (LOTREL) 5-20 MG capsule Take 1 capsule by mouth every evening. 01/16/16  Yes [name], MD  bimatoprost (LUMIGAN) 0.03 % ophthalmic solution Place 1 drop into both eyes at bedtime.    Yes [provider]  Doxepin HCl (SILENOR) 3 MG TABS Take 3 mg by mouth at bedtime.   Yes [provider]  LORazepam (ATIVAN) 0.5 MG tablet Take 0.5 mg by mouth every 8 (eight) hours.   Yes [provider]  mirabegron ER (MYRBETRIQ) 25 MG TB24 tablet Take 25 mg by mouth daily.   Yes [provider]  PROAIR HFA 108 (90 Base) MCG/ACT inhaler INHALE 2 PUFFS INTO THE LUNGS EVERY 4 HOURS AS NEEDED FOR WHEEZING OR SHORTNESS OF BREATH 08/02/16  Yes English, [place] D, PA  SYNTHROID 75 MCG tablet TAKE 1 TABLET BY MOUTH DAILY BEFORE BREAKFAST 06/07/16  Yes [name], MD  Fluticasone-Salmeterol (ADVAIR DISKUS) 500-50 MCG/DOSE AEPB Inhale 1 puff into the lungs 2 (two) times daily. Patient not taking: Reported on 12/08/2016 01/16/16   [name], MD  levothyroxine (SYNTHROID) 88 MCG tablet Take 1 tablet (88 mcg total) by mouth daily before breakfast. Patient not taking: Reported on 12/08/2016 01/18/16   [name], MD  meclizine (ANTIVERT) 25 MG tablet Take 1 tablet (25 mg total) by mouth 3 (three)  times daily as needed for dizziness. 12/08/16   Melene Plan, DO  oxybutynin (DITROPAN) 5 MG tablet Take 1 tablet (5 mg total) by mouth 3 (three) times daily. Patient not taking: Reported on 12/08/2016 04/23/16   Rodolph Bong, MD  QUEtiapine (SEROQUEL) 25 MG tablet Take 1 tablet (25 mg total) by mouth at bedtime. Patient not taking: Reported on 12/08/2016 04/23/16   Rodolph Bong, MD    Family History Family History  Problem Relation Age of Onset  . Heart disease Mother   . Clotting disorder Mother   . Cancer Mother   . Allergies Sister        X2  . Osteoporosis Maternal Grandmother     Social History Social History  Substance Use Topics  . Smoking status: Passive Smoke Exposure - Never Smoker  .  Smokeless tobacco: Never Used     Comment: EXPOSED TO SECOND HAND SMOKE AS CHILD  . Alcohol use No     Allergies   Aspirin; Codeine; Orange juice [orange oil]; and Prednisone   Review of Systems Review of Systems  Constitutional: Negative for chills and fever.  HENT: Negative for congestion and rhinorrhea.   Eyes: Negative for redness and visual disturbance.  Respiratory: Negative for shortness of breath and wheezing.   Cardiovascular: Positive for near-syncope. Negative for chest pain and palpitations.  Gastrointestinal: Negative for nausea and vomiting.  Genitourinary: Negative for dysuria and urgency.  Musculoskeletal: Negative for arthralgias and myalgias.  Skin: Negative for pallor and wound.  Neurological: Positive for dizziness and headaches.     Physical Exam Updated Vital Signs BP 132/61 (BP Location: Left Arm)   Pulse 80   Temp 97.8 F (36.6 C) (Oral)   Resp 16   Ht 5' (1.524 m)   Wt 43.1 kg (95 lb)   SpO2 97%   BMI 18.55 kg/m   Physical Exam  Constitutional: She is oriented to person, place, and time. She appears well-developed and well-nourished. No distress.  HENT:  Head: Normocephalic and atraumatic.  Eyes: EOM are normal. Pupils are equal,  round, and reactive to light.  Neck: Normal range of motion. Neck supple.  Cardiovascular: Normal rate and regular rhythm.  Exam reveals no gallop and no friction rub.   No murmur heard. Pulmonary/Chest: Effort normal. She has no wheezes. She has no rales.  Abdominal: Soft. She exhibits no distension. There is no tenderness.  Musculoskeletal: She exhibits tenderness (right foot). She exhibits no edema.  Boot on right foot  Neurological: She is alert and oriented to person, place, and time. She has normal strength. No cranial nerve deficit or sensory deficit. Coordination and gait normal. GCS eye subscore is 4. GCS verbal subscore is 5. GCS motor subscore is 6.  Reflex Scores:      Tricep reflexes are 2+ on the right side and 2+ on the left side.      Bicep reflexes are 2+ on the right side and 2+ on the left side.      Brachioradialis reflexes are 2+ on the right side and 2+ on the left side.      Patellar reflexes are 2+ on the right side and 2+ on the left side.      Achilles reflexes are 2+ on the right side and 2+ on the left side. Skin: Skin is warm and dry. She is not diaphoretic.  Psychiatric: She has a normal mood and affect. Her behavior is normal.  Nursing note and vitals reviewed.    ED Treatments / Results  Labs (all labs ordered are listed, but only abnormal results are displayed) Labs Reviewed - No data to display  EKG  EKG Interpretation None       Radiology No results found.  Procedures Procedures (including critical care time)  Medications Ordered in ED Medications - No data to display   Initial Impression / Assessment and Plan / ED Course  I have reviewed the triage vital signs and the nursing notes.  Pertinent labs & imaging results that were available during my care of the patient were reviewed by me and considered in my medical decision making (see chart for details).     71 yo F With concussive like symptoms. Going on for the past couple weeks.  Not on blood thinners. Unremarkable neuro exam. Feel that a CT scan in  this context of limited utility and discussed with family who will follow-up with the concussion clinic.  12:26 PM:  I have discussed the diagnosis/risks/treatment options with the patient and caregiver and believe the pt to be eligible for discharge home to follow-up with PCP. We also discussed returning to the ED immediately if new or worsening sx occur. We discussed the sx which are most concerning (e.g., sudden worsening pain, fever, inability to tolerate by mouth) that necessitate immediate return. Medications administered to the patient during their visit and any new prescriptions provided to the patient are listed below.  Medications given during this visit Medications - No data to display   The patient appears reasonably screen and/or stabilized for discharge and I doubt any other medical condition or other [facility] requiring further screening, evaluation, or treatment in the ED at this time prior to discharge.    Final Clinical Impressions(s) / ED Diagnoses   Final diagnoses:  Concussion with loss of consciousness, initial encounter    New Prescriptions New Prescriptions   MECLIZINE (ANTIVERT) 25 MG TABLET    Take 1 tablet (25 mg total) by mouth 3 (three) times daily as needed for dizziness.     Melene Plan, DO 12/08/16 1226

## 2016-12-08 NOTE — ED Triage Notes (Signed)
Patient fell a week ago from a trip and an aluminum air intake vent fell on her head. Patient was seen by an orthopedist for a hairline fracture to the right foot. Patient states she has had blurred vision and dizziness since the fall.

## 2016-12-21 ENCOUNTER — Other Ambulatory Visit: Payer: Self-pay | Admitting: Physician Assistant

## 2016-12-29 ENCOUNTER — Other Ambulatory Visit: Payer: Self-pay | Admitting: Physician Assistant

## 2017-02-13 ENCOUNTER — Other Ambulatory Visit: Payer: Self-pay | Admitting: Family Medicine

## 2017-02-13 DIAGNOSIS — J4530 Mild persistent asthma, uncomplicated: Secondary | ICD-10-CM

## 2017-02-13 DIAGNOSIS — R0600 Dyspnea, unspecified: Secondary | ICD-10-CM

## 2017-04-14 ENCOUNTER — Other Ambulatory Visit: Payer: Self-pay | Admitting: Physician Assistant

## 2017-04-14 DIAGNOSIS — E2839 Other primary ovarian failure: Secondary | ICD-10-CM

## 2017-05-05 ENCOUNTER — Other Ambulatory Visit: Payer: Self-pay | Admitting: Physician Assistant

## 2017-05-05 DIAGNOSIS — Z1231 Encounter for screening mammogram for malignant neoplasm of breast: Secondary | ICD-10-CM

## 2017-09-20 ENCOUNTER — Encounter: Payer: Self-pay | Admitting: Physician Assistant

## 2017-09-30 IMAGING — DX DG LUMBAR SPINE 2-3V
3 series · 3 of 3 positions shown · non-contrast
Comparison: None.

CLINICAL DATA: Pain following fall

EXAM:
LUMBAR SPINE - 2-3 VIEW

[l-spine ap]
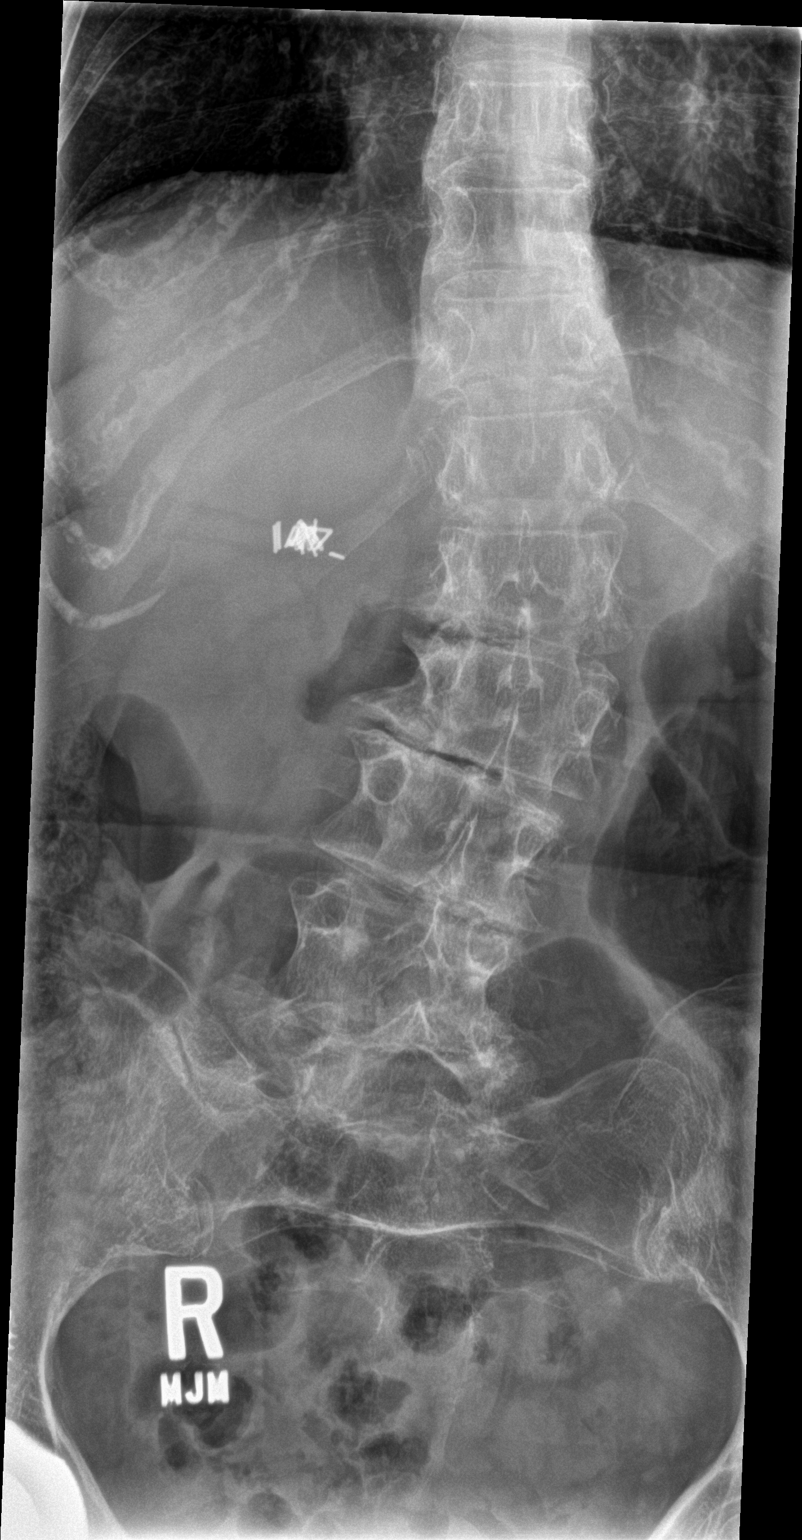

[l-spine lat]
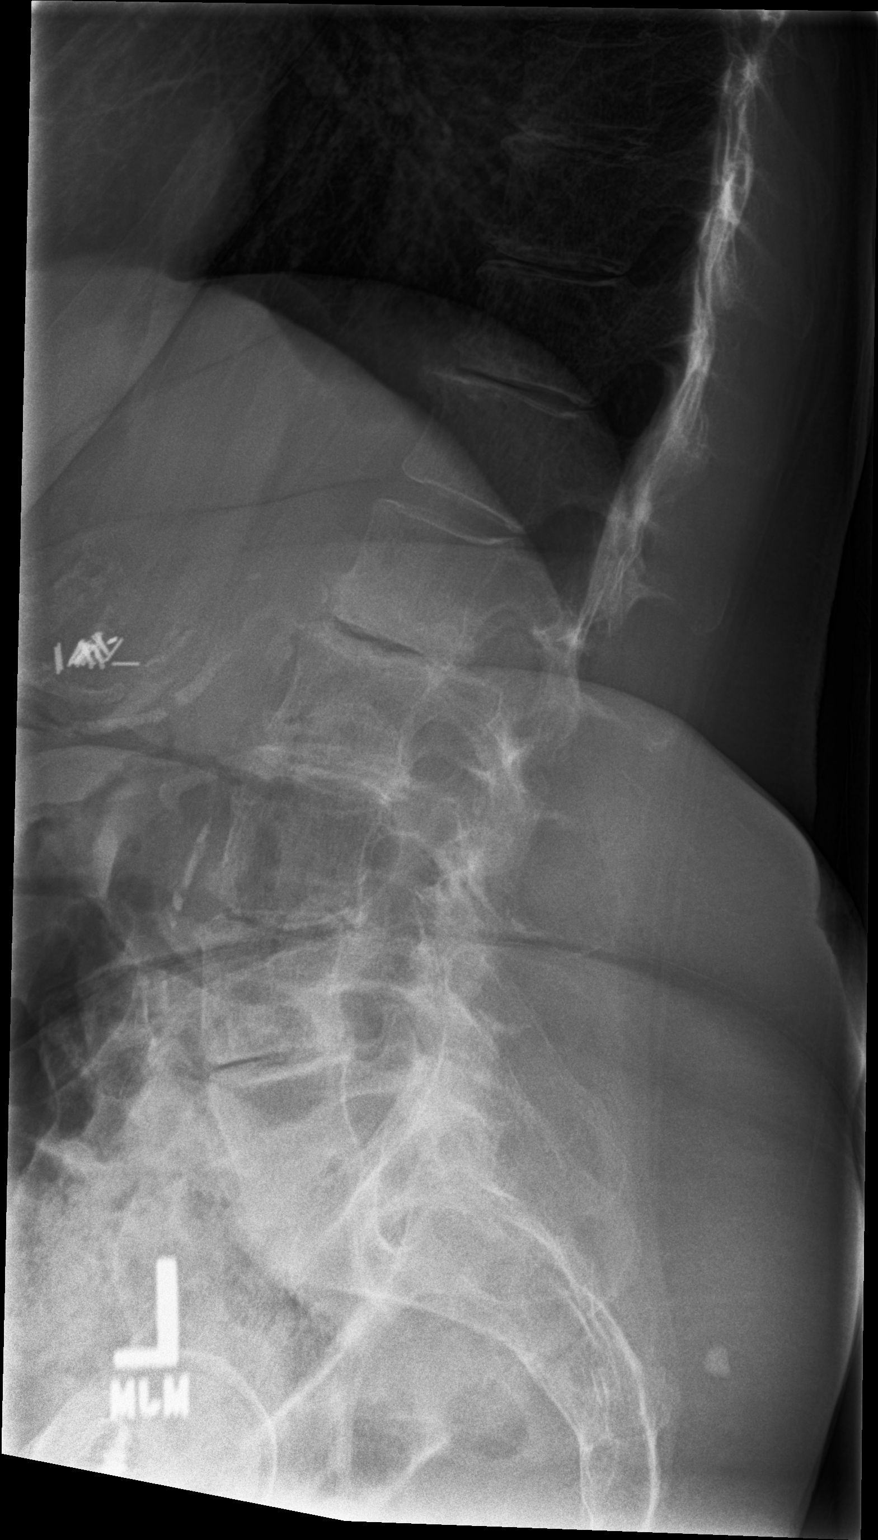

[l-spine l5-s1]
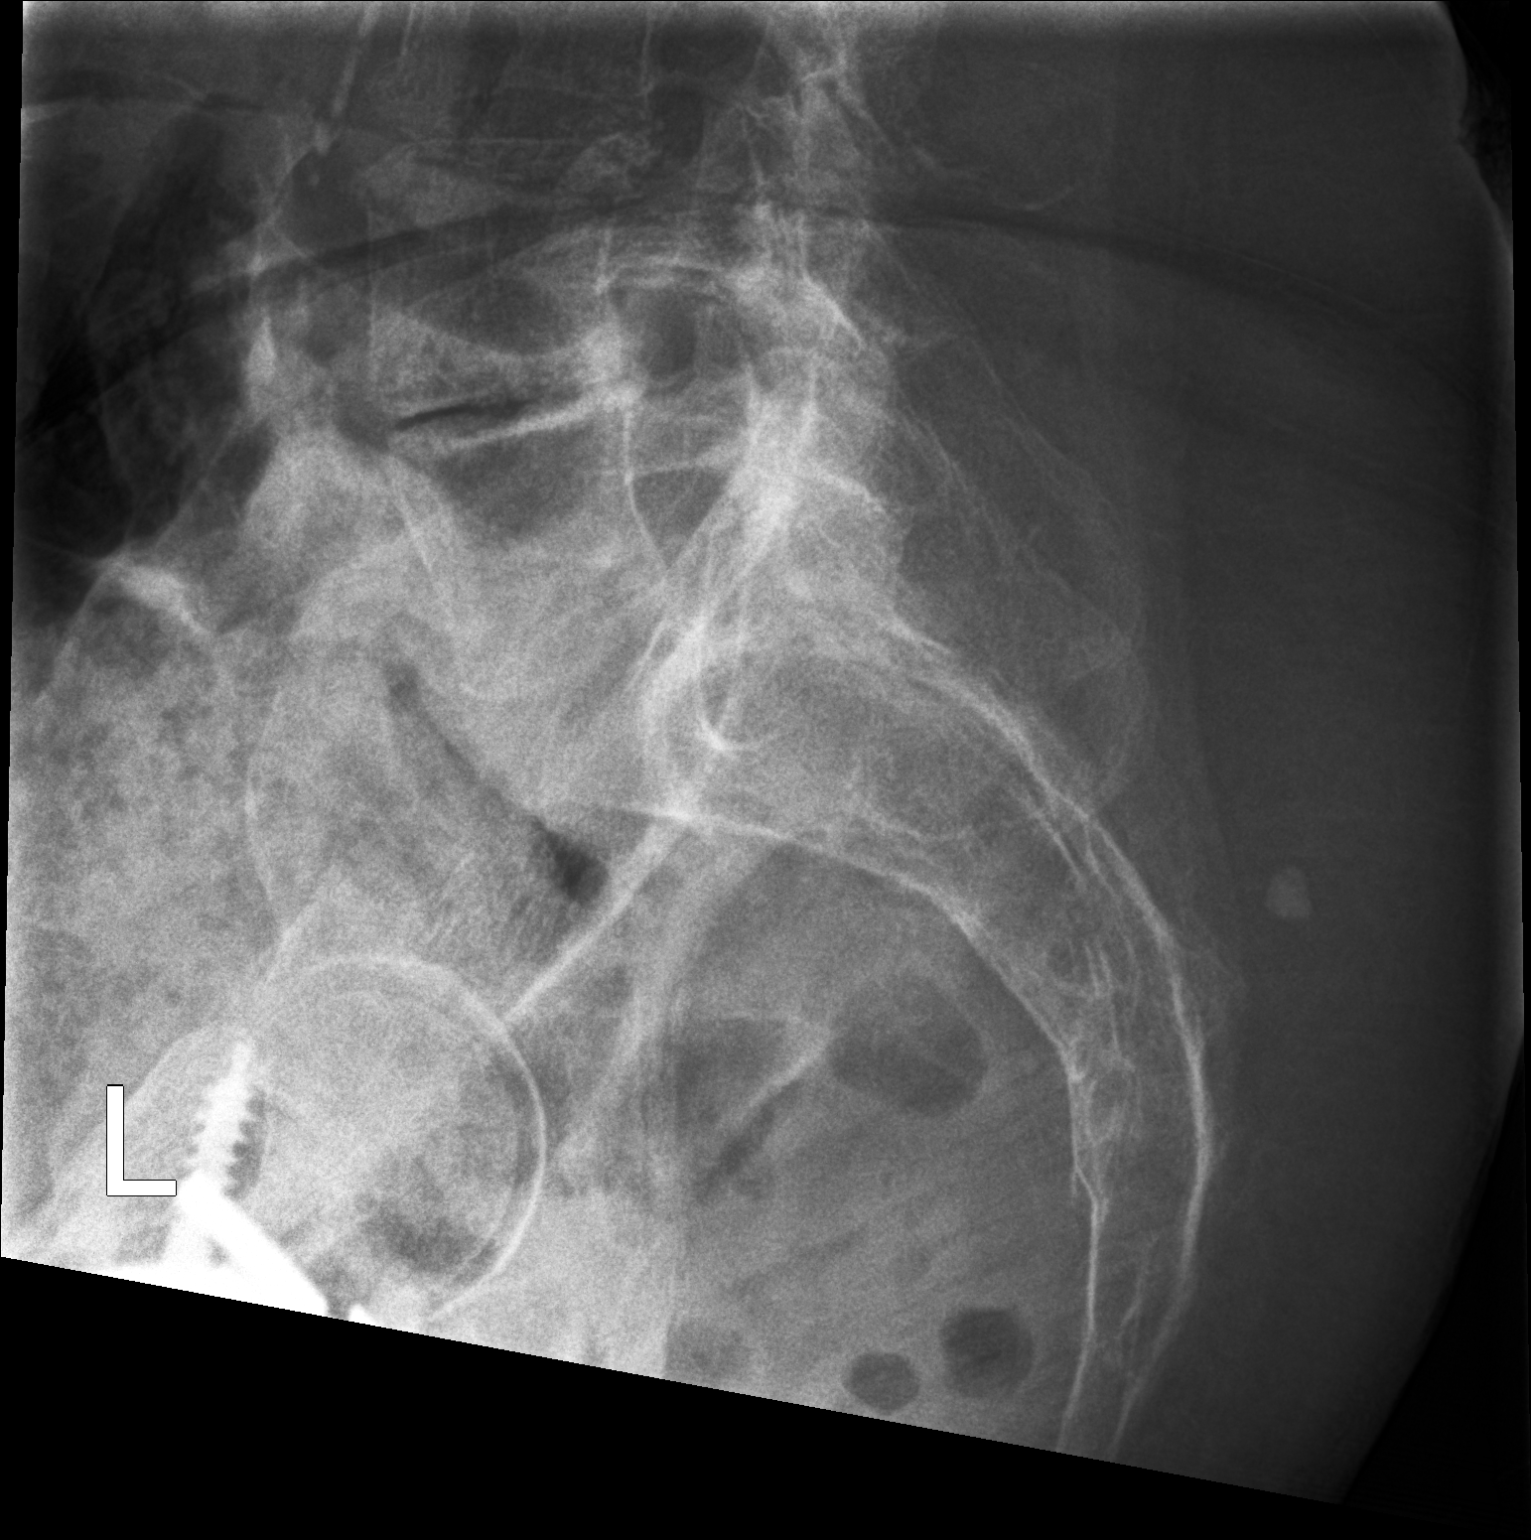

[3 of 3 positions shown; findings below may reference images not displayed]

FINDINGS: Frontal, lateral, and spot lumbosacral lateral images were obtained.
There are 5 non-rib-bearing lumbar type vertebral bodies. There is
thoracolumbar levoscoliosis. No fracture or spondylolisthesis is
evident. There is moderately severe disc space narrowing at all
levels in the lumbar region. No erosive change. There is
atherosclerotic calcification in the aorta.
IMPRESSION: Multifocal osteoarthritic change. Scoliosis. No fracture or
spondylolisthesis. There is aortic atherosclerosis.

## 2017-09-30 IMAGING — DX DG FOOT COMPLETE 3+V*L*
3 series · 3 of 3 positions shown · non-contrast
Comparison: 01/08/2014.

CLINICAL DATA: Fall.

EXAM:
LEFT FOOT - COMPLETE 3+ VIEW

[foot ap]
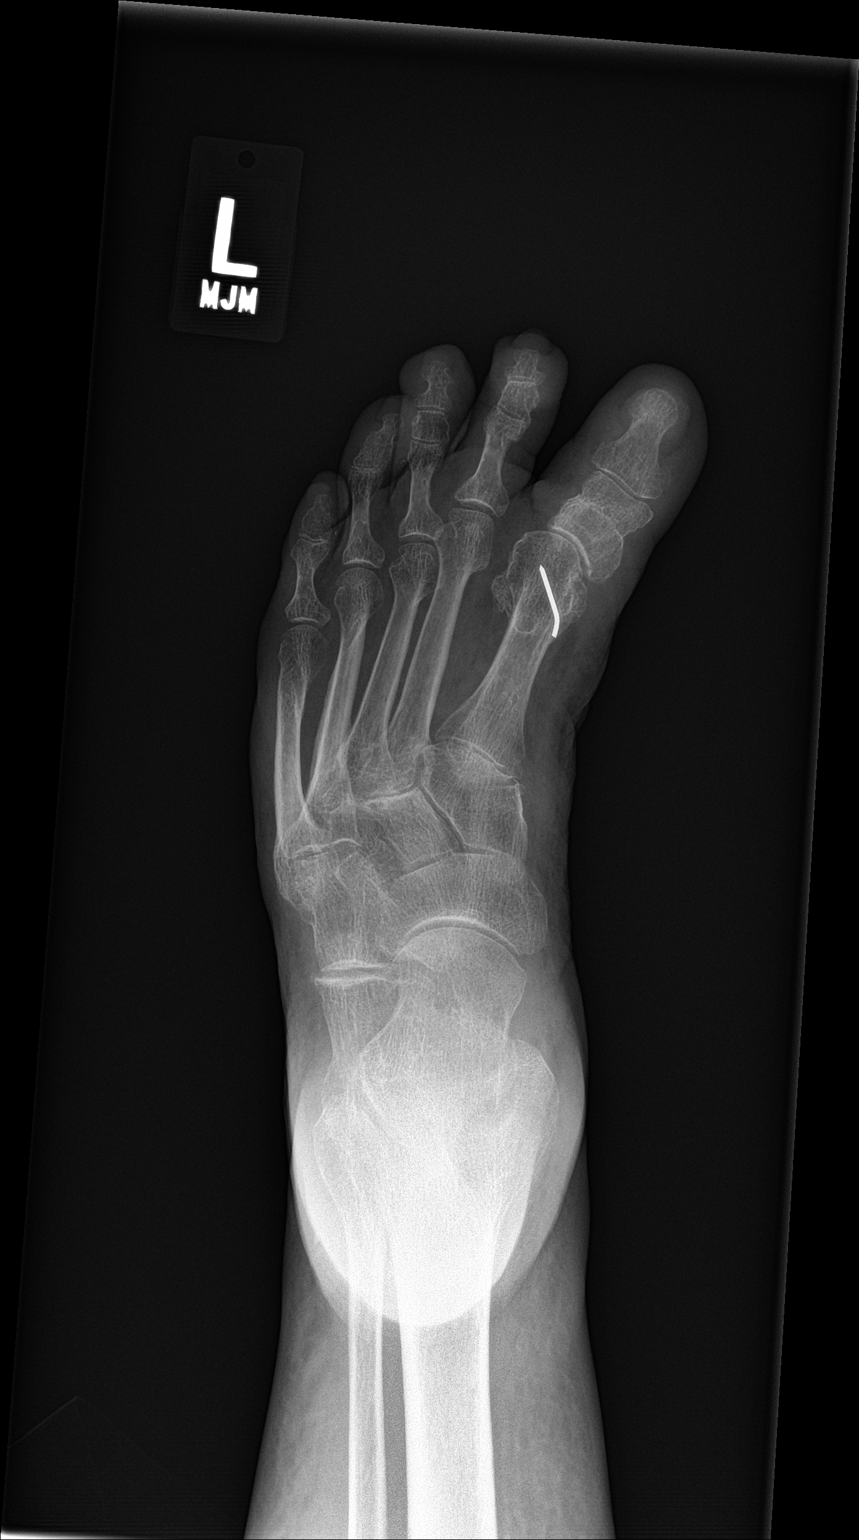

[foot obl]
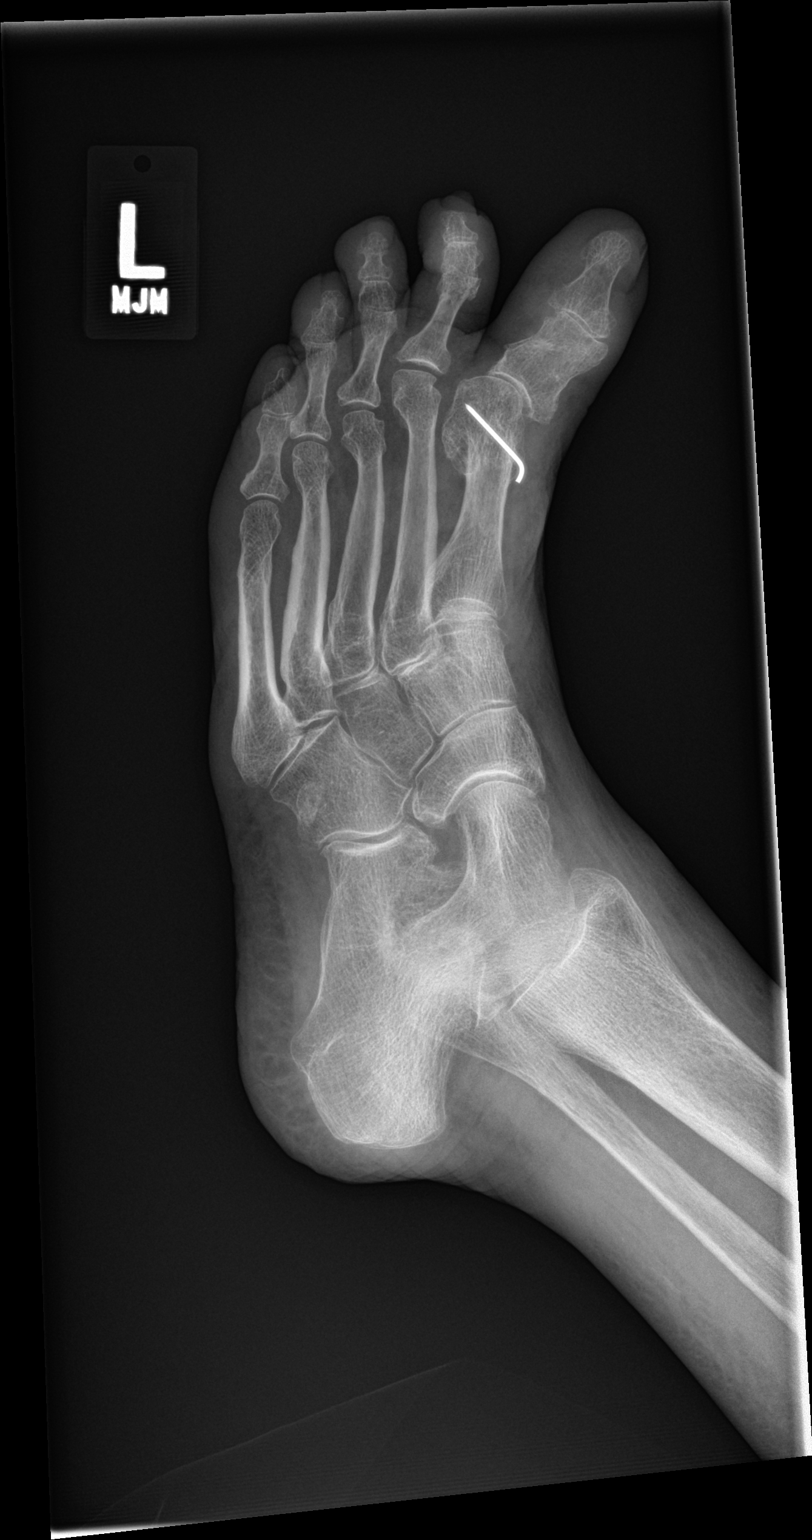

[foot lat]
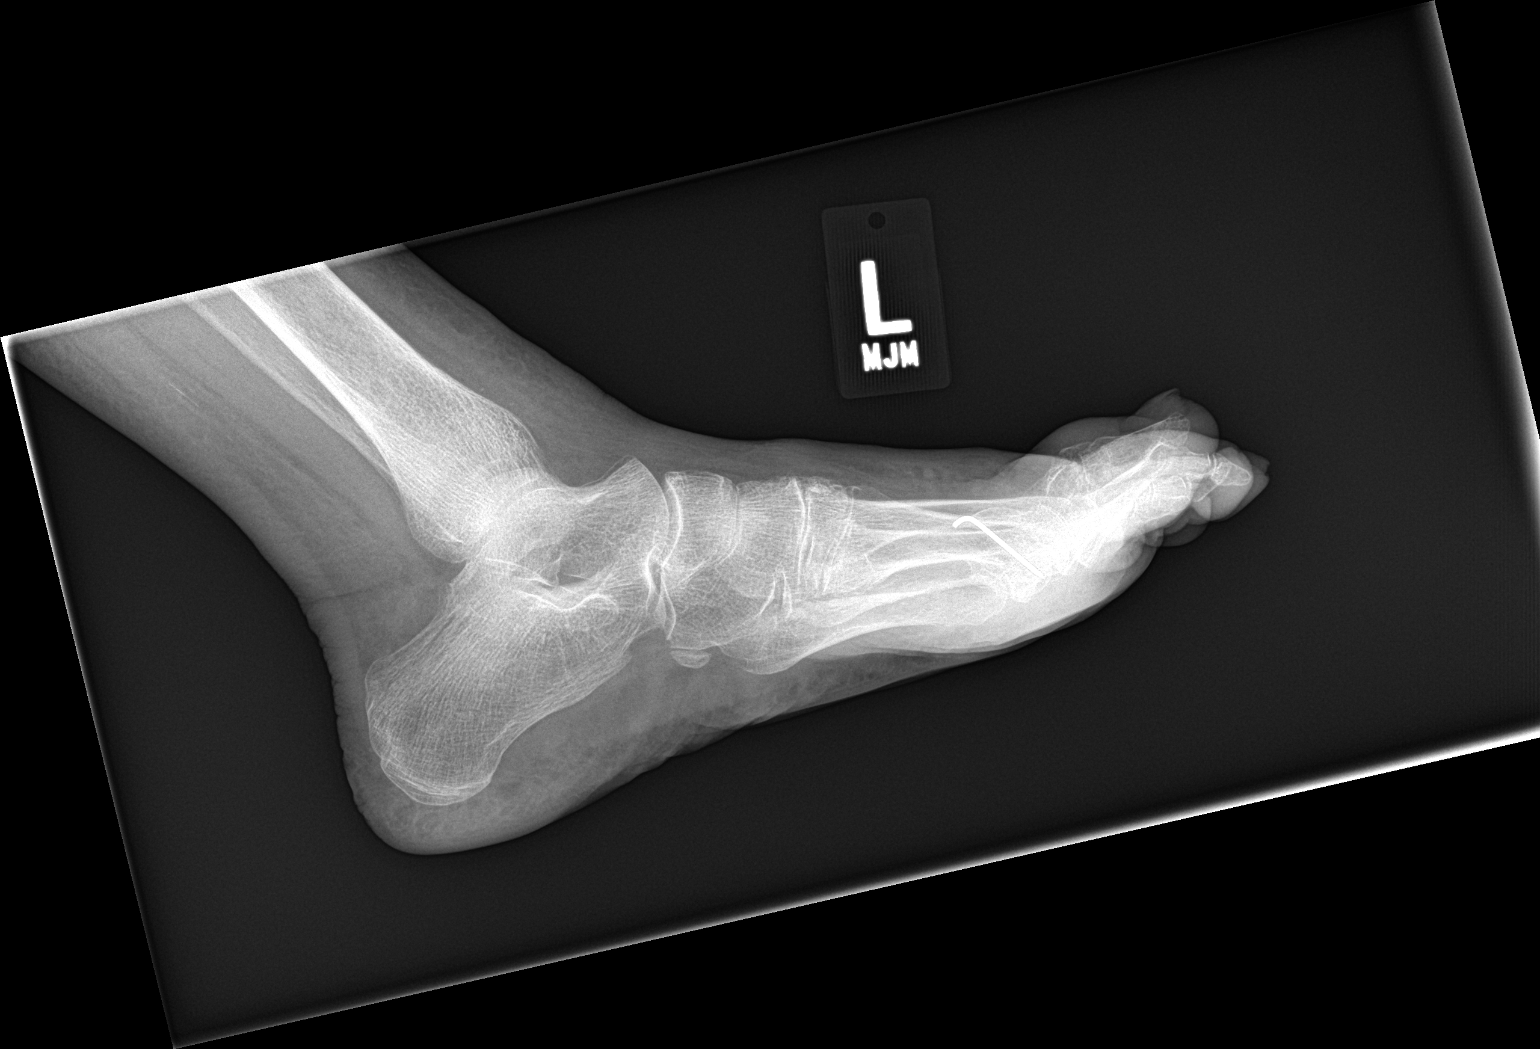

[3 of 3 positions shown; findings below may reference images not displayed]

FINDINGS: Surgical can noted in the distal left first metatarsal. Diffuse
degenerative change. Diffuse osteopenia. An acute fracture of the
distal portion of the proximal phalanx of the left second toe is
noted. Old healed fracture of the proximal phalanx of the left great
toe.
IMPRESSION: 1. Acute fracture of the distal aspect of the proximal phalanx of
the left second toe.

2. Diffuse degenerative changes. Postsurgical changes first
metatarsal.

## 2017-11-12 ENCOUNTER — Encounter (HOSPITAL_COMMUNITY): Payer: Self-pay | Admitting: Emergency Medicine

## 2017-11-12 ENCOUNTER — Emergency Department (HOSPITAL_COMMUNITY): Payer: BLUE CROSS/BLUE SHIELD

## 2017-11-12 ENCOUNTER — Emergency Department (HOSPITAL_COMMUNITY)
Admission: EM | Admit: 2017-11-12 | Discharge: 2017-11-12 | Disposition: A | Discharge status: Home / Self Care | Payer: BLUE CROSS/BLUE SHIELD | Attending: Emergency Medicine | Admitting: Emergency Medicine

## 2017-11-12 DIAGNOSIS — I10 Essential (primary) hypertension: Secondary | ICD-10-CM | POA: Diagnosis not present

## 2017-11-12 DIAGNOSIS — F0390 Unspecified dementia without behavioral disturbance: Secondary | ICD-10-CM | POA: Insufficient documentation

## 2017-11-12 DIAGNOSIS — E876 Hypokalemia: Secondary | ICD-10-CM | POA: Insufficient documentation

## 2017-11-12 DIAGNOSIS — E039 Hypothyroidism, unspecified: Secondary | ICD-10-CM | POA: Diagnosis not present

## 2017-11-12 DIAGNOSIS — R202 Paresthesia of skin: Secondary | ICD-10-CM

## 2017-11-12 DIAGNOSIS — Z7722 Contact with and (suspected) exposure to environmental tobacco smoke (acute) (chronic): Secondary | ICD-10-CM | POA: Diagnosis not present

## 2017-11-12 DIAGNOSIS — Z79899 Other long term (current) drug therapy: Secondary | ICD-10-CM | POA: Insufficient documentation

## 2017-11-12 DIAGNOSIS — R200 Anesthesia of skin: Secondary | ICD-10-CM | POA: Diagnosis present

## 2017-11-12 HISTORY — DX: Unspecified dementia, unspecified severity, without behavioral disturbance, psychotic disturbance, mood disturbance, and anxiety: F03.90

## 2017-11-12 LAB — COMPREHENSIVE METABOLIC PANEL
ALT: 27 U/L (ref 14–54)
ANION GAP: 9 (ref 5–15)
AST: 36 U/L (ref 15–41)
Albumin: 3.7 g/dL (ref 3.5–5.0)
Alkaline Phosphatase: 76 U/L (ref 38–126)
BUN: 23 mg/dL — ABNORMAL HIGH (ref 6–20)
CALCIUM: 9.3 mg/dL (ref 8.9–10.3)
CHLORIDE: 105 mmol/L (ref 101–111)
CO2: 27 mmol/L (ref 22–32)
CREATININE: 0.86 mg/dL (ref 0.44–1.00)
Glucose, Bld: 100 mg/dL — ABNORMAL HIGH (ref 65–99)
Potassium: 3.2 mmol/L — ABNORMAL LOW (ref 3.5–5.1)
Sodium: 141 mmol/L (ref 135–145)
Total Bilirubin: 0.7 mg/dL (ref 0.3–1.2)
Total Protein: 6.8 g/dL (ref 6.5–8.1)

## 2017-11-12 LAB — I-STAT CHEM 8, ED
BUN: 24 mg/dL — AB (ref 6–20)
CREATININE: 0.7 mg/dL (ref 0.44–1.00)
Calcium, Ion: 1.09 mmol/L — ABNORMAL LOW (ref 1.15–1.40)
Chloride: 105 mmol/L (ref 101–111)
Glucose, Bld: 98 mg/dL (ref 65–99)
HCT: 38 % (ref 36.0–46.0)
Hemoglobin: 12.9 g/dL (ref 12.0–15.0)
POTASSIUM: 3.2 mmol/L — AB (ref 3.5–5.1)
Sodium: 141 mmol/L (ref 135–145)
TCO2: 25 mmol/L (ref 22–32)

## 2017-11-12 LAB — DIFFERENTIAL
Abs Immature Granulocytes: 0 10*3/uL (ref 0.0–0.1)
BASOS ABS: 0 10*3/uL (ref 0.0–0.1)
BASOS PCT: 0 %
EOS PCT: 6 %
Eosinophils Absolute: 0.4 10*3/uL (ref 0.0–0.7)
Immature Granulocytes: 0 %
Lymphocytes Relative: 27 %
Lymphs Abs: 2 10*3/uL (ref 0.7–4.0)
MONO ABS: 0.6 10*3/uL (ref 0.1–1.0)
MONOS PCT: 8 %
NEUTROS ABS: 4.4 10*3/uL (ref 1.7–7.7)
NEUTROS PCT: 59 %

## 2017-11-12 LAB — CBC
HEMATOCRIT: 39.2 % (ref 36.0–46.0)
HEMOGLOBIN: 12.9 g/dL (ref 12.0–15.0)
MCH: 29.8 pg (ref 26.0–34.0)
MCHC: 32.9 g/dL (ref 30.0–36.0)
MCV: 90.5 fL (ref 78.0–100.0)
Platelets: 268 10*3/uL (ref 150–400)
RBC: 4.33 MIL/uL (ref 3.87–5.11)
RDW: 13.8 % (ref 11.5–15.5)
WBC: 7.4 10*3/uL (ref 4.0–10.5)

## 2017-11-12 LAB — URINALYSIS, ROUTINE W REFLEX MICROSCOPIC
BILIRUBIN URINE: NEGATIVE
Bacteria, UA: NONE SEEN
Glucose, UA: NEGATIVE mg/dL
Hgb urine dipstick: NEGATIVE
Ketones, ur: 5 mg/dL — AB
Leukocytes, UA: NEGATIVE
Nitrite: NEGATIVE
PH: 5 (ref 5.0–8.0)
Protein, ur: 30 mg/dL — AB
SPECIFIC GRAVITY, URINE: 1.025 (ref 1.005–1.030)

## 2017-11-12 LAB — PROTIME-INR
INR: 0.99
Prothrombin Time: 13 seconds (ref 11.4–15.2)

## 2017-11-12 LAB — RAPID URINE DRUG SCREEN, HOSP PERFORMED
Amphetamines: NOT DETECTED
Barbiturates: NOT DETECTED
Benzodiazepines: POSITIVE — AB
Cocaine: NOT DETECTED
OPIATES: NOT DETECTED
Tetrahydrocannabinol: NOT DETECTED

## 2017-11-12 LAB — I-STAT TROPONIN, ED: TROPONIN I, POC: 0.01 ng/mL (ref 0.00–0.08)

## 2017-11-12 LAB — APTT: APTT: 28 s (ref 24–36)

## 2017-11-12 MED ORDER — POTASSIUM CHLORIDE CRYS ER 20 MEQ PO TBCR
40.0000 meq | EXTENDED_RELEASE_TABLET | Freq: Once | ORAL | Status: AC
  Administered 2017-11-12: 40 meq via ORAL
  Filled 2017-11-12: qty 2

## 2017-11-12 NOTE — ED Provider Notes (Signed)
[facility] EMERGENCY DEPARTMENT Provider Note   CSN: [id number] Arrival date & time: 11/12/17  4098     History   Chief Complaint Chief Complaint  Patient presents with  . Numbness    HPI ELLYN [name] is a 72 y.o. female.  HPI Level V caveat dementia history is obtained from her husband who accompanies her.  Patient brought by EMS. She reports her husband this morning that she felt numbness in both legs and complained of "stiffness" in one of her fingers she presently denies. Her husband states she looks like she normally does. No treatment prior to coming here.. Past Medical History:  Diagnosis Date  . Abnormal heart rhythm   . Allergic rhinitis   . Arthritis    "right hip" (12/17/2015)  . Asthma   . Cardiomegaly   . Dementia   . Duodenal ulcer   . Emphysema   . Fall 03/2016  . Family history of anesthesia complication    TWIN SISTER HAS DELAYED REACTION -with "caine" meds  . Heart murmur   . Hyperlipidemia   . Hypertension   . Hypothyroidism    SEVERE   . Mitral valve regurgitation   . Osteoporosis   . Partial deafness   . Pneumonia   . Shortness of breath     Patient Active Problem List   Diagnosis Date Noted  . Right foot pain 12/01/2016  . Rib pain on right side 12/01/2016  . Toe pain, left 05/26/2016  . Urinary frequency 04/23/2016  . Urinary urgency 04/23/2016  . Chest pain   . Acute encephalopathy 04/20/2016  . Altered mental status 04/20/2016  . Manic state (HCC) 04/20/2016  . Abnormal heart rhythm 01/16/2016  . History of right hip replacement 12/16/2015  . Prediabetes 04/02/2015  . Osteoporosis   . Mood disorder in conditions classified elsewhere 02/05/2013  . Severe hypothyroidism 01/24/2013  . 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 Hyperlipidemia 01/24/2013  . Abnormal LFTs 03/10/2011  . 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 BP (high blood pressure) 03/04/2011  . Adult hypothyroidism  03/04/2011  . Atrial enlargement, left 03/04/2011  . Left ventricular hypertrophy 03/04/2011  . Billowing mitral valve 03/04/2011  . Pulmonary hypertension (HCC) 03/04/2011  . Pain in right hip 03/04/2011  . Disease of supporting structures of teeth 10/28/2010  . OP (osteoporosis) 09/01/2010  . Abnormal blood sugar 09/01/2010  . HLD (hyperlipidemia) 09/01/2010  . Essential (primary) hypertension 09/01/2010  . Avitaminosis D 09/01/2010  . Bipolar I disorder, single manic episode (HCC) 04/21/2010  . Difficulty hearing 08/17/2009  . Disorder of mitral valve 05/22/2009  . CANDIDIASIS, ORAL 03/06/2009  . Essential hypertension 05/09/2008  . ALLERGIC RHINITIS 05/09/2008  . Asthma 05/09/2008    Past Surgical History:  Procedure Laterality Date  . ABDOMINAL HYSTERECTOMY    . CATARACT EXTRACTION W/ INTRAOCULAR LENS  IMPLANT, BILATERAL Bilateral ~ 2015  . CHOLECYSTECTOMY OPEN  ?1980s  . CLEFT PALATE REPAIR  1949  . INNER EAR SURGERY Bilateral   . JOINT REPLACEMENT    . REPAIR OF PERFORATED ULCER  ?1980s   "duodenal"  . THYROIDECTOMY  2000s  . TONSILLECTOMY AND ADENOIDECTOMY  1950s  . TOTAL HIP ARTHROPLASTY Right 12/16/2015  . TOTAL HIP ARTHROPLASTY Right 12/16/2015   Procedure: RIGHT TOTAL HIP ARTHROPLASTY ANTERIOR APPROACH;  Surgeon: Tarry Kos, MD;  Location: [facility] OR;  Service: Orthopedics;  Laterality: Right;  . TUBAL LIGATION       OB History  None      Home Medications    Prior to Admission medications   Medication Sig Start Date End Date Taking? Authorizing Provider  acetaminophen (TYLENOL) 500 MG tablet Take 1-2 tablets (500-1,000 mg total) by mouth every 6 (six) hours as needed for mild pain or moderate pain. 12/16/15   Tarry Kos, MD  ADVAIR DISKUS 500-50 MCG/DOSE AEPB INHALE 1 PUFF INTO THE LUNGS TWICE DAILY 06/07/16   [name], MD  amLODipine-benazepril (LOTREL) 5-20 MG capsule Take 1 capsule by mouth every evening. 01/16/16   [name], MD  bimatoprost  (LUMIGAN) 0.03 % ophthalmic solution Place 1 drop into both eyes at bedtime.     [provider]  Doxepin HCl (SILENOR) 3 MG TABS Take 3 mg by mouth at bedtime.    [provider]  Fluticasone-Salmeterol (ADVAIR DISKUS) 500-50 MCG/DOSE AEPB Inhale 1 puff into the lungs 2 (two) times daily. Patient not taking: Reported on 12/08/2016 01/16/16   [name], MD  levothyroxine (SYNTHROID) 88 MCG tablet Take 1 tablet (88 mcg total) by mouth daily before breakfast. Patient not taking: Reported on 12/08/2016 01/18/16   [name], MD  LORazepam (ATIVAN) 0.5 MG tablet Take 0.5 mg by mouth every 8 (eight) hours.    [provider]  meclizine (ANTIVERT) 25 MG tablet Take 1 tablet (25 mg total) by mouth 3 (three) times daily as needed for dizziness. 12/08/16   Melene Plan, DO  mirabegron ER (MYRBETRIQ) 25 MG TB24 tablet Take 25 mg by mouth daily.    [provider]  oxybutynin (DITROPAN) 5 MG tablet Take 1 tablet (5 mg total) by mouth 3 (three) times daily. Patient not taking: Reported on 12/08/2016 04/23/16   Rodolph Bong, MD  PROAIR HFA 108 [phone] Base) MCG/ACT inhaler INHALE 2 PUFFS INTO THE LUNGS EVERY 4 HOURS AS NEEDED FOR WHEEZING OR SHORTNESS OF BREATH 08/02/16   Trena Platt D, PA  QUEtiapine (SEROQUEL) 25 MG tablet Take 1 tablet (25 mg total) by mouth at bedtime. Patient not taking: Reported on 12/08/2016 04/23/16   Rodolph Bong, MD  SYNTHROID 75 MCG tablet TAKE 1 TABLET BY MOUTH DAILY BEFORE BREAKFAST 06/07/16   [name], MD    Family History Family History  Problem Relation Age of Onset  . Heart disease Mother   . Clotting disorder Mother   . Cancer Mother   . Allergies Sister        X2  . Osteoporosis Maternal Grandmother     Social History Social History   Tobacco Use  . Smoking status: Passive Smoke Exposure - Never Smoker  . Smokeless tobacco: Never Used  . Tobacco comment: EXPOSED TO SECOND HAND SMOKE AS CHILD  Substance Use Topics    . Alcohol use: No  . Drug use: No     Allergies   Aspirin; Codeine; Orange juice [orange oil]; and Prednisone   Review of Systems Review of Systems  Unable to perform ROS: Dementia  HENT: Positive for hearing loss.        Chronically hard of hearing  Musculoskeletal: Positive for gait problem.       Walks with cane     Physical Exam Updated Vital Signs BP (!) 181/73 (BP Location: Left Arm)   Pulse 90   Temp 97.6 F (36.4 C) (Oral)   Resp 18   SpO2 100%   Physical Exam  Constitutional:  Frail, chronically ill-appearing, cachectic  HENT:  Head: Normocephalic and  atraumatic.  Right Ear: External ear normal.  No facial asymmetry  Eyes: Pupils are equal, round, and reactive to light. Conjunctivae are normal.  Neck: Neck supple. No tracheal deviation present. No thyromegaly present.  Cardiovascular: Normal rate and regular rhythm.  No murmur heard. Pulmonary/Chest: Effort normal and breath sounds normal.  Abdominal: Soft. Bowel sounds are normal. She exhibits no distension. There is no tenderness.  Musculoskeletal: Normal range of motion. She exhibits no edema or tenderness.  Neurological: She is alert. Coordination normal.  Follows simple moves all extremities DTR symmetric bilaterally knee jerk ankle jerk and biceps was toes downgoing bilaterally. Walks with minimal assistance. Gait is shuffling  Skin: Skin is warm and dry. No rash noted.  Psychiatric: She has a normal mood and affect.  Nursing note and vitals reviewed.    ED Treatments / Results  Labs (all labs ordered are listed, but only abnormal results are displayed) Labs Reviewed  URINALYSIS, ROUTINE W REFLEX MICROSCOPIC  ETHANOL  PROTIME-INR  APTT  CBC  DIFFERENTIAL  COMPREHENSIVE METABOLIC PANEL  RAPID URINE DRUG SCREEN, HOSP PERFORMED  I-STAT CHEM 8, ED  I-STAT TROPONIN, ED    EKG None  Radiology No results found.  Procedures Procedures (including critical care time)  Medications  Ordered in ED Medications - No data to display Results for orders placed or performed during the hospital encounter of 11/12/17  Urinalysis, Routine w reflex microscopic  Result Value Ref Range   Color, Urine YELLOW YELLOW   APPearance CLEAR CLEAR   Specific Gravity, Urine 1.025 1.005 - 1.030   pH 5.0 5.0 - 8.0   Glucose, UA NEGATIVE NEGATIVE mg/dL   Hgb urine dipstick NEGATIVE NEGATIVE   Bilirubin Urine NEGATIVE NEGATIVE   Ketones, ur 5 (A) NEGATIVE mg/dL   Protein, ur 30 (A) NEGATIVE mg/dL   Nitrite NEGATIVE NEGATIVE   Leukocytes, UA NEGATIVE NEGATIVE   Bacteria, UA NONE SEEN NONE SEEN   Mucus PRESENT    Hyaline Casts, UA PRESENT   Protime-INR  Result Value Ref Range   Prothrombin Time 13.0 11.4 - 15.2 seconds   INR 0.99   APTT  Result Value Ref Range   aPTT 28 24 - 36 seconds  CBC  Result Value Ref Range   WBC 7.4 4.0 - 10.5 K/uL   RBC 4.33 3.87 - 5.11 MIL/uL   Hemoglobin 12.9 12.0 - 15.0 g/dL   HCT 45.4 09.8 - 11.9 %   MCV 90.5 78.0 - 100.0 fL   MCH 29.8 26.0 - 34.0 pg   MCHC 32.9 30.0 - 36.0 g/dL   RDW 14.7 82.9 - 56.2 %   Platelets 268 150 - 400 K/uL  Differential  Result Value Ref Range   Neutrophils Relative % 59 %   Neutro Abs 4.4 1.7 - 7.7 K/uL   Lymphocytes Relative 27 %   Lymphs Abs 2.0 0.7 - 4.0 K/uL   Monocytes Relative 8 %   Monocytes Absolute 0.6 0.1 - 1.0 K/uL   Eosinophils Relative 6 %   Eosinophils Absolute 0.4 0.0 - 0.7 K/uL   Basophils Relative 0 %   Basophils Absolute 0.0 0.0 - 0.1 K/uL   Immature Granulocytes 0 %   Abs Immature Granulocytes 0.0 0.0 - 0.1 K/uL  Comprehensive metabolic panel  Result Value Ref Range   Sodium 141 135 - 145 mmol/L   Potassium 3.2 (L) 3.5 - 5.1 mmol/L   Chloride 105 101 - 111 mmol/L   CO2 27 22 - 32 mmol/L  Glucose, Bld 100 (H) 65 - 99 mg/dL   BUN 23 (H) 6 - 20 mg/dL   Creatinine, Ser 1.61 0.44 - 1.00 mg/dL   Calcium 9.3 8.9 - 09.6 mg/dL   Total Protein 6.8 6.5 - 8.1 g/dL   Albumin 3.7 3.5 - 5.0 g/dL    AST 36 15 - 41 U/L   ALT 27 14 - 54 U/L   Alkaline Phosphatase 76 38 - 126 U/L   Total Bilirubin 0.7 0.3 - 1.2 mg/dL   GFR calc non Af Amer >60 >60 mL/min   GFR calc Af Amer >60 >60 mL/min   Anion gap 9 5 - 15  Urine rapid drug screen (hosp performed)  Result Value Ref Range   Opiates NONE DETECTED NONE DETECTED   Cocaine NONE DETECTED NONE DETECTED   Benzodiazepines POSITIVE (A) NONE DETECTED   Amphetamines NONE DETECTED NONE DETECTED   Tetrahydrocannabinol NONE DETECTED NONE DETECTED   Barbiturates NONE DETECTED NONE DETECTED  I-Stat Chem 8, ED  Result Value Ref Range   Sodium 141 135 - 145 mmol/L   Potassium 3.2 (L) 3.5 - 5.1 mmol/L   Chloride 105 101 - 111 mmol/L   BUN 24 (H) 6 - 20 mg/dL   Creatinine, Ser 0.45 0.44 - 1.00 mg/dL   Glucose, Bld 98 65 - 99 mg/dL   Calcium, Ion 4.09 (L) 1.15 - 1.40 mmol/L   TCO2 25 22 - 32 mmol/L   Hemoglobin 12.9 12.0 - 15.0 g/dL   HCT 81.1 91.4 - 78.2 %  I-stat troponin, ED  Result Value Ref Range   Troponin i, poc 0.01 0.00 - 0.08 ng/mL   Comment 3           Mr Brain Limited Wo Contrast  Result Date: 11/12/2017 CLINICAL DATA:  Neuro deficits, subacute. Unsteady gait. The patient awoke 4 hours ago with bilateral lower extremity numbness and left thumb weakness. EXAM: MRI HEAD WITHOUT CONTRAST TECHNIQUE: Multiplanar, multiecho pulse sequences of the brain and surrounding structures were obtained without intravenous contrast. COMPARISON:  CT head without contrast 04/20/2016. FINDINGS: Brain: The diffusion-weighted images demonstrate no acute or subacute infarction. No acute hemorrhage or mass lesion is present. Moderate generalized atrophy is present. Mild periventricular and subcortical white matter changes are noted bilaterally. The brainstem and cerebellum are normal. Vascular: Flow is present in the major intracranial arteries. Skull and upper cervical spine: The skull base is within normal limits. Sagittal imaging was not performed.  Craniocervical junction is within normal limits. Sinuses/Orbits: Posterior ethmoid opacification is present. There is chronic right maxillary sinus disease. The remaining paranasal sinuses are clear. The mastoid air cells are clear. Bilateral lens replacements are present. Globes and orbits are within normal limits. IMPRESSION: 1. No acute or subacute infarction. 2. Advanced atrophy and white matter disease likely reflects the sequela of chronic microvascular ischemia. 3. Posterior ethmoid sinus disease bilaterally. 4. Chronic right maxillary sinus disease. Electronically Signed   By: Marin Roberts M.D.   On: 11/12/2017 10:09    Initial Impression / Assessment and Plan / ED Course  I have reviewed the triage vital signs and the nursing notes.  Pertinent labs & imaging results that were available during my care of the patient were reviewed by me and considered in my medical decision making (see chart for details).     Husband reports she normally walks better than presently.MRI, blood work urine EKG ordered under stroke order set Oral potassium supplementation ordered. 12 noon patient resting comfortably. No distress.  Husband feels confident that he can take care of patient at home adequately Final Clinical Impressions(s) / ED Diagnoses  Diagnosis #1 paresthesias #2 hypokalemia Final diagnoses:  None    ED Discharge Orders    None       Doug Sou, MD 11/12/17 1221

## 2017-11-12 NOTE — ED Triage Notes (Signed)
Pt transported from home, per EMS pt woke her husband up @ 0600 c/o numbness to bilat lower extremities and c/o L thumb "not working" Alert and oriented but EMS reports pt does repeat questions, hx of dementia

## 2017-11-12 NOTE — Discharge Instructions (Addendum)
No signs of stroke. Contact Cheryl [name]'s primary care physician if concern for any reason or return here

## 2017-11-24 ENCOUNTER — Ambulatory Visit (INDEPENDENT_AMBULATORY_CARE_PROVIDER_SITE_OTHER): Payer: Medicare Other | Admitting: Orthopaedic Surgery

## 2017-11-24 ENCOUNTER — Telehealth (INDEPENDENT_AMBULATORY_CARE_PROVIDER_SITE_OTHER): Payer: Self-pay

## 2017-11-24 NOTE — Telephone Encounter (Signed)
FYI  Tried to call patient mutliple times no answer. Tried calling both phone numbers but 1 is disconnected.   LMOM

## 2017-12-15 ENCOUNTER — Ambulatory Visit (INDEPENDENT_AMBULATORY_CARE_PROVIDER_SITE_OTHER): Payer: BLUE CROSS/BLUE SHIELD | Admitting: Orthopaedic Surgery

## 2017-12-15 ENCOUNTER — Encounter (INDEPENDENT_AMBULATORY_CARE_PROVIDER_SITE_OTHER): Payer: Self-pay | Admitting: Orthopaedic Surgery

## 2017-12-15 ENCOUNTER — Ambulatory Visit (INDEPENDENT_AMBULATORY_CARE_PROVIDER_SITE_OTHER): Payer: Self-pay

## 2017-12-15 DIAGNOSIS — Z96641 Presence of right artificial hip joint: Secondary | ICD-10-CM | POA: Diagnosis not present

## 2017-12-15 NOTE — Progress Notes (Signed)
Post-Op Visit Note   Patient: Cheryl DarbyDonna E Lanz           Date of Birth: 03/18/1946           MRN: [id number] Visit Date: 12/15/2017 PCP: Teena IraniSpencer, Sara C, PA-C   Assessment & Plan:  Chief Complaint:  Chief Complaint  Patient presents with  . Right Hip - Follow-up   Visit Diagnoses:  1. History of right hip replacement     Plan: Patient is 2 years status post right total knee.  Overall she is doing well.  Denies any complaints.  She walks with a cane.  She complains of some mild low back pain.  Denies any radicular symptoms.  Physical exam is unremarkable.  Fully healed surgical scar.  Painless rotation of the hip.  At this point we will see her back in another 2 years with standing AP pelvis and lateral hip.  Patient instructed to call with any questions or concerns in the meantime.  May discontinue dental prophylaxis at this point.  Follow-Up Instructions: Return in about 2 years (around 12/16/2019).   Orders:  Orders Placed This Encounter  Procedures  . XR Pelvis 1-2 Views   No orders of the defined types were placed in this encounter.   Imaging: Xr Pelvis 1-2 Views  Result Date: 12/15/2017 Stable right total hip replacement.  No complications.   PMFS History: Patient Active Problem List   Diagnosis Date Noted  . Right foot pain 12/01/2016  . Rib pain on right side 12/01/2016  . Toe pain, left 05/26/2016  . Urinary frequency 04/23/2016  . Urinary urgency 04/23/2016  . Chest pain   . Acute encephalopathy 04/20/2016  . Altered mental status 04/20/2016  . Manic state (HCC) 04/20/2016  . Abnormal heart rhythm 01/16/2016  . History of right hip replacement 12/16/2015  . Prediabetes 04/02/2015  . Osteoporosis   . Mood disorder in conditions classified elsewhere 02/05/2013  . Severe hypothyroidism 01/24/2013  . 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 Hyperlipidemia 01/24/2013  . Abnormal LFTs 03/10/2011  . 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 BP (high blood pressure) 03/04/2011  . Adult hypothyroidism 03/04/2011  . Atrial enlargement, left 03/04/2011  . Left ventricular hypertrophy 03/04/2011  . Billowing mitral valve 03/04/2011  . Pulmonary hypertension (HCC) 03/04/2011  . Pain in right hip 03/04/2011  . Disease of supporting structures of teeth 10/28/2010  . OP (osteoporosis) 09/01/2010  . Abnormal blood sugar 09/01/2010  . HLD (hyperlipidemia) 09/01/2010  . Essential (primary) hypertension 09/01/2010  . Avitaminosis D 09/01/2010  . Bipolar I disorder, single manic episode (HCC) 04/21/2010  . Difficulty hearing 08/17/2009  . Disorder of mitral valve 05/22/2009  . CANDIDIASIS, ORAL 03/06/2009  . Essential hypertension 05/09/2008  . ALLERGIC RHINITIS 05/09/2008  . Asthma 05/09/2008   Past Medical History:  Diagnosis Date  . Abnormal heart rhythm   . Allergic rhinitis   . Arthritis    "right hip" (12/17/2015)  . Asthma   . Cardiomegaly   . Dementia   . Duodenal ulcer   . Emphysema   . Fall 03/2016  . Family history of anesthesia complication    TWIN SISTER HAS DELAYED REACTION -with "caine" meds  . Heart murmur   . Hyperlipidemia   . Hypertension   . Hypothyroidism    SEVERE   . Mitral valve regurgitation   . Osteoporosis   . Partial deafness   . Pneumonia   . Shortness of breath  Family History  Problem Relation Age of Onset  . Heart disease Mother   . Clotting disorder Mother   . Cancer Mother   . Allergies Sister        X2  . Osteoporosis Maternal Grandmother     Past Surgical History:  Procedure Laterality Date  . ABDOMINAL HYSTERECTOMY    . CATARACT EXTRACTION W/ INTRAOCULAR LENS  IMPLANT, BILATERAL Bilateral ~ 2015  . CHOLECYSTECTOMY OPEN  ?1980s  . CLEFT PALATE REPAIR  1949  . INNER EAR SURGERY Bilateral   . JOINT REPLACEMENT    . REPAIR OF PERFORATED ULCER  ?1980s   "duodenal"  . THYROIDECTOMY  2000s  . TONSILLECTOMY AND ADENOIDECTOMY  1950s  . TOTAL HIP ARTHROPLASTY Right  12/16/2015  . TOTAL HIP ARTHROPLASTY Right 12/16/2015   Procedure: RIGHT TOTAL HIP ARTHROPLASTY ANTERIOR APPROACH;  Surgeon: Tarry Kos, MD;  Location: [facility] OR;  Service: Orthopedics;  Laterality: Right;  . TUBAL LIGATION     Social History   Occupational History  . Not on file  Tobacco Use  . Smoking status: Passive Smoke Exposure - Never Smoker  . Smokeless tobacco: Never Used  . Tobacco comment: EXPOSED TO SECOND HAND SMOKE AS CHILD  Substance and Sexual Activity  . Alcohol use: No  . Drug use: No  . Sexual activity: Yes    Birth control/protection: Condom

## 2018-03-20 ENCOUNTER — Other Ambulatory Visit: Payer: Self-pay | Admitting: Family Medicine

## 2018-03-20 ENCOUNTER — Other Ambulatory Visit: Payer: Self-pay | Admitting: Physician Assistant

## 2018-03-20 DIAGNOSIS — Z1231 Encounter for screening mammogram for malignant neoplasm of breast: Secondary | ICD-10-CM

## 2018-04-20 ENCOUNTER — Ambulatory Visit
Admission: RE | Admit: 2018-04-20 | Discharge: 2018-04-20 | Disposition: A | Discharge status: Home / Self Care | Payer: BLUE CROSS/BLUE SHIELD | Source: Ambulatory Visit | Attending: Physician Assistant | Admitting: Physician Assistant

## 2018-04-20 DIAGNOSIS — Z1231 Encounter for screening mammogram for malignant neoplasm of breast: Secondary | ICD-10-CM

## 2019-07-21 ENCOUNTER — Ambulatory Visit: Payer: BLUE CROSS/BLUE SHIELD

## 2019-07-26 ENCOUNTER — Ambulatory Visit: Payer: BLUE CROSS/BLUE SHIELD

## 2019-07-28 ENCOUNTER — Ambulatory Visit: Payer: BC Managed Care – PPO | Attending: Internal Medicine

## 2022-07-29 ENCOUNTER — Encounter (HOSPITAL_BASED_OUTPATIENT_CLINIC_OR_DEPARTMENT_OTHER): Payer: Medicare HMO | Attending: General Surgery | Admitting: General Surgery

## 2022-07-29 DIAGNOSIS — L97221 Non-pressure chronic ulcer of left calf limited to breakdown of skin: Secondary | ICD-10-CM | POA: Diagnosis present

## 2022-07-29 DIAGNOSIS — I272 Pulmonary hypertension, unspecified: Secondary | ICD-10-CM | POA: Insufficient documentation

## 2022-07-29 DIAGNOSIS — I10 Essential (primary) hypertension: Secondary | ICD-10-CM | POA: Diagnosis not present

## 2022-07-29 DIAGNOSIS — F0390 Unspecified dementia without behavioral disturbance: Secondary | ICD-10-CM | POA: Insufficient documentation

## 2022-07-29 DIAGNOSIS — E039 Hypothyroidism, unspecified: Secondary | ICD-10-CM | POA: Insufficient documentation

## 2022-07-29 DIAGNOSIS — J45909 Unspecified asthma, uncomplicated: Secondary | ICD-10-CM | POA: Diagnosis not present

## 2022-07-29 DIAGNOSIS — M1990 Unspecified osteoarthritis, unspecified site: Secondary | ICD-10-CM | POA: Insufficient documentation

## 2022-07-29 NOTE — Progress Notes (Signed)
Cheryl [name], Cheryl [name] ([id number]) [id number] Page 1 of 8 Visit Report for 07/29/2022 Chief Complaint Document Details Patient Name: Date of Service: Cheryl [name], Cheryl [name]. 07/29/2022 10:45 A M Medical Record Number: [id number] Patient Account Number: [id number] Date of Birth/Sex: Treating RN: Nov 07, 1945 (77 y.o. F) Primary Care Provider: Roe Coombs Other Clinician: Referring Provider: Treating Provider/Extender: Santiago Bur in Treatment: 0 Information Obtained from: Patient Chief Complaint Patient seen for complaints of Non-Healing Wound. Electronic Signature(s) Signed: 07/29/2022 12:18:56 PM By: Fredirick Maudlin MD FACS Entered By: Fredirick Maudlin on 07/29/2022 12:18:56 -------------------------------------------------------------------------------- HPI Details Patient Name: Date of Service: Cheryl [name], Cheryl [name]. 07/29/2022 10:45 A M Medical Record Number: [id number] Patient Account Number: [id number] Date of Birth/Sex: Treating RN: 1945-09-26 (77 y.o. F) Primary Care Provider: Roe Coombs Other Clinician: Referring Provider: Treating Provider/Extender: Santiago Bur in Treatment: 0 History of Present Illness HPI Description: ADMISSION 07/29/2022 This is a 77 year old woman with a past medical history notable for hypertension, hypothyroidism, and dementia. She was referred by her primary care doctor for evaluation of wounds on her bilateral legs. The family practice notes characterizes these as venous stasis ulcers, but these are actually superficial wounds on the patient's medial bilateral thighs. They appear to start as blisters and the wounds are limited to breakdown of skin. She has an intact blister on her right medial thigh. She does not have any lower leg wounds. She and her husband deny any skin picking habits. These Cheryl not appear to be consistent with urine burns from incontinence. They deny any new soaps, detergents,  or clothing that could be irritating the area. They have been applying triamcinolone and mupirocin to the wounds. She is currently taking doxycycline, although I Cheryl not see any evidence of infection. Electronic Signature(s) Signed: 07/29/2022 12:22:15 PM By: Fredirick Maudlin MD FACS Entered By: Fredirick Maudlin on 07/29/2022 12:22:15 -------------------------------------------------------------------------------- Physical Exam Details Patient Name: Date of Service: Cheryl [name]. 07/29/2022 10:45 A M Medical Record Number: [id number] Patient Account Number: [id number] Date of Birth/Sex: Treating RN: 1945-06-25 (77 y.o. Cheryl [name], Cheryl [name] ([id number]) Cheryl [name], Cheryl [name] ([id number]) [id number] Page 2 of 8 Primary Care Provider: Roe Coombs Other Clinician: Referring Provider: Treating Provider/Extender: Santiago Bur in Treatment: 0 Constitutional Hypertensive, asymptomatic. Bradycardic, asymptomatic. . . No acute distress. Respiratory Normal work of breathing on room air. Notes 07/29/2022: There are superficial wounds on the patient's medial bilateral thighs. They appear to start as blisters and the wounds are limited to breakdown of skin. She has an intact blister on her right medial thigh. She does not have any lower leg wounds. Electronic Signature(s) Signed: 07/29/2022 12:24:08 PM By: Fredirick Maudlin MD FACS Entered By: Fredirick Maudlin on 07/29/2022 12:24:08 -------------------------------------------------------------------------------- Physician Orders Details Patient Name: Date of Service: Cheryl [name]. 07/29/2022 10:45 A M Medical Record Number: [id number] Patient Account Number: [id number] Date of Birth/Sex: Treating RN: 02/17/1946 (77 y.o. 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 - Dr. Celine Ahr Return A Bathing/ Shower/ Hygiene May shower and wash wound with soap and water. Wound Treatment Wound #1 - Upper Leg Wound Laterality: Left, Medial Topical: Triamcinolone  Every Other Day/30 Days Discharge Instructions: Apply Triamcinolone as directed Prim Dressing: Sorbalgon AG Dressing, 4x4 (in/in) Every Other Day/30 Days ary Discharge Instructions: Apply to wound bed as instructed Secondary Dressing: Zetuvit Plus Silicone Border Dressing 4x4 (in/in) Every Other Day/30 Days Discharge Instructions: Apply silicone border over primary dressing as directed. Wound #2 - Upper Leg Wound Laterality: Left, Medial Topical: Triamcinolone Every Other Day/30 Days Discharge Instructions: Apply Triamcinolone as directed Prim Dressing: Sorbalgon AG Dressing, 4x4 (in/in) Every Other Day/30 Days ary Discharge Instructions: Apply to wound bed as instructed Secondary Dressing: Zetuvit Plus Silicone Border Dressing 4x4 (in/in) Every Other Day/30 Days Discharge Instructions: Apply silicone border over primary dressing as directed. Cheryl [name], Cheryl [name] ([id number]) [id number] Page 3 of 8 Electronic Signature(s) Signed: 07/29/2022 12:24:20 PM By: Fredirick Maudlin MD FACS Entered By: Fredirick Maudlin on 07/29/2022 12:24:19 -------------------------------------------------------------------------------- Problem List Details Patient Name: Date of Service: Cheryl [name]. 07/29/2022 10:45 A M Medical Record Number: [id number] Patient Account Number: [id number] Date of Birth/Sex: Treating RN: 1945-09-20 (77 y.o. 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 07/29/2022 10:45 A M Medical Record Number: [id number] Patient Account Number: [id number] Date of Birth/Sex: Treating RN: Oct 18, 1945 (77 y.o. F) Primary Care Provider: Roe Coombs Other Clinician: Referring Provider: Treating Provider/Extender: Santiago Bur in Treatment: 0 Subjective Chief Complaint RASHI, LODES ([id number]) [id number] Page 4 of 8 Information obtained from Patient Patient seen for complaints of Non-Healing Wound. History of Present Illness (HPI) ADMISSION 07/29/2022 This is a 77 year old woman with a past medical history notable for hypertension, hypothyroidism, and dementia. She was referred by her primary care doctor for evaluation of wounds on her bilateral legs. The family practice notes characterizes these as venous stasis ulcers, but these are actually superficial wounds on the patient's medial bilateral thighs. They appear to start as blisters and the wounds are limited to breakdown of skin. She has an intact blister on her  right medial thigh. She does not have any lower leg wounds. She and her husband deny any skin picking habits. These Cheryl not appear to be consistent with urine burns from incontinence. They deny any new soaps, detergents, or clothing that could be irritating the area. They have been applying triamcinolone and mupirocin to the wounds. She is currently taking doxycycline, although I Cheryl not see any evidence of infection. Patient History Information obtained from Patient, Caregiver, Chart. Allergies aspirin, codeine, prednisone, orange juice Family History Heart Disease, Hypertension - Mother, No family history of Cancer, Diabetes, Hereditary Spherocytosis, Kidney Disease, Lung Disease, Seizures, Stroke, Thyroid Problems, Tuberculosis. Social History Never smoker, Marital Status - Married, Alcohol Use - Never, Drug Use - No History, Caffeine Use - Daily. Medical History Eyes Patient has history of Cataracts - bil removed Denies history of Glaucoma, Optic Neuritis Ear/Nose/Mouth/Throat Patient has history of Chronic sinus problems/congestion Denies  history of Middle ear problems Respiratory Patient has history of Asthma Cardiovascular Patient has history of Hypertension Endocrine Denies history of Type I Diabetes, Type II Diabetes Genitourinary Denies history of End Stage Renal Disease Immunological Denies history of Lupus Erythematosus, Raynaudoos, Scleroderma Integumentary (Skin) Denies history of History of Burn Musculoskeletal Patient has history of Osteoarthritis Denies history of Gout, Rheumatoid Arthritis Oncologic Denies history of Received Chemotherapy, Received Radiation Psychiatric Denies history of Anorexia/bulimia, Confinement Anxiety Medical A Surgical History Notes nd Ear/Nose/Mouth/Throat hard of hearing Cardiovascular mitral valve disorder, pulmonary hypertension Endocrine hypothyroidism Musculoskeletal osteoporesis Review of Systems (ROS) Constitutional  Symptoms (General Health) Denies complaints or symptoms of Fatigue, Fever, Chills, Marked Weight Change. Eyes Complains or has symptoms of Glasses / Contacts - reading. Denies complaints or symptoms of Dry Eyes, Vision Changes. Ear/Nose/Mouth/Throat Denies complaints or symptoms of Chronic sinus problems or rhinitis. Respiratory Denies complaints or symptoms of Chronic or frequent coughs, Shortness of Breath. Cardiovascular Denies complaints or symptoms of Chest pain. Gastrointestinal Denies complaints or symptoms of Frequent diarrhea, Nausea, Vomiting. Endocrine Denies complaints or symptoms of Heat/cold intolerance. Integumentary (Skin) Complains or has symptoms of Wounds - right upper thigh. Musculoskeletal Complains or has symptoms of Muscle Weakness. Denies complaints or symptoms of Muscle Pain. Neurologic Denies complaints or symptoms of Numbness/parasthesias. Psychiatric Denies complaints or symptoms of Claustrophobia. Cheryl [name], Cheryl [name] ([id number]) [id number] Page 5 of 8 Objective Constitutional Hypertensive, asymptomatic. Bradycardic, asymptomatic. No acute distress. Vitals Time Taken: 11:04 AM, Height: 60 in, Weight: 116.5 lbs, BMI: 22.7, Temperature: 97.7 F, Pulse: 50 bpm, Respiratory Rate: 20 breaths/min, Blood Pressure: 179/65 mmHg. Respiratory Normal work of breathing on room air. General Notes: 07/29/2022: There are superficial wounds on the patient's medial bilateral thighs. They appear to start as blisters and the wounds are limited to breakdown of skin. She has an intact blister on her right medial thigh. She does not have any lower leg wounds. Integumentary (Hair, Skin) Wound #1 status is Open. Original cause of wound was Blister. The date acquired was: 07/25/2022. The wound is located on the Left,Medial Upper Leg. The wound measures 0.9cm length x 1cm width x 0.1cm depth; 0.707cm^2 area and 0.071cm^3 volume. The wound is limited to skin  breakdown. There is no tunneling or undermining noted. There is a small amount of serous drainage noted. The wound margin is flat and intact. There is large (67-100%) pink granulation within the wound bed. There is no necrotic tissue within the wound bed. The periwound skin appearance had no abnormalities noted for texture. The periwound skin appearance exhibited: Hemosiderin Staining. The periwound skin appearance did not exhibit: Dry/Scaly, Maceration, Atrophie Blanche, Cyanosis, Ecchymosis, Mottled, Pallor, Rubor, Erythema. Periwound temperature was noted as No Abnormality. The periwound has tenderness on palpation. Wound #2 status is Open. Original cause of wound was Blister. The date acquired was: 07/25/2022. The wound is located on the Left,Medial Upper Leg. The wound measures 2.6cm length x 1.4cm width x 0.1cm depth; 2.859cm^2 area and 0.286cm^3 volume. There is no tunneling or undermining noted. There is a none present amount of drainage noted. The wound margin is flat and intact. There is no granulation within the wound bed. There is no necrotic tissue within the wound bed. The periwound skin appearance had no abnormalities noted for texture. The periwound skin appearance had no abnormalities noted for moisture. The periwound skin appearance exhibited: Hemosiderin Staining. Periwound temperature was noted as No Abnormality. The periwound has tenderness on palpation. 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 - Dr. Celine Ahr Bathing/ Shower/ Hygiene: May shower and wash wound with soap and water. WOUND #1: - Upper Leg Wound Laterality: Left, Medial Topical: Triamcinolone Every Other Day/30  Days Discharge Instructions: Apply Triamcinolone as directed Prim Dressing: Sorbalgon AG Dressing, 4x4 (in/in) Every Other Day/30 Days ary Discharge Instructions: Apply to wound bed as instructed Secondary Dressing: Zetuvit Plus Silicone Border Dressing 4x4 (in/in) Every Other Day/30 Days Discharge Instructions: Apply silicone border over primary dressing as directed. WOUND #2: - Upper Leg Wound Laterality: Left, Medial Topical: Triamcinolone Every Other Day/30 Days Discharge Instructions: Apply Triamcinolone as directed Prim Dressing: Sorbalgon AG Dressing, 4x4 (in/in) Every Other Day/30 Days ary Discharge Instructions: Apply to wound bed as instructed Secondary Dressing: Zetuvit Plus Silicone Border Dressing 4x4 (in/in) Every Other Day/30 Days Discharge Instructions: Apply silicone border over primary dressing as directed. 07/29/2022: This is a 77 year old woman who presents with atypical wounds on her bilateral legs. These are not consistent with venous stasis ulcers. There are superficial wounds on the patient's medial bilateral thighs. They appear to start as blisters and the wounds are limited to breakdown of skin. She has an intact blister on her right medial thigh. She does not have any lower leg wounds. No debridement was necessary today. We instructed the patient and her husband to wash the area daily with mild soap and water and pat them dry. We will use silver alginate and foam border dressings to the open sites as well as some silver alginate over the intact blister, as I anticipate it will likely rupture of its own Cheryl [name], Cheryl [name] ([id number]) [id number] Page 6 of 8 accord. They will follow-up in 2 weeks. Electronic Signature(s) Signed: 07/29/2022 12:25:36 PM By: Fredirick Maudlin MD FACS Entered By: Fredirick Maudlin on 07/29/2022 12:25:35 -------------------------------------------------------------------------------- HxROS Details Patient Name: Date  of Service: Cheryl [name], Cheryl [name]. 07/29/2022 10:45 A M Medical Record Number: [id number] Patient Account Number: [id number] Date of Birth/Sex: Treating RN: 17-Sep-1945 (77 y.o. Cheryl [name] Primary Care Provider: Roe Coombs Other Clinician: Referring Provider: Treating Provider/Extender: Santiago Bur in Treatment: 0 Information Obtained From Patient Caregiver Chart Constitutional Symptoms (General Health) Complaints and Symptoms: Negative for: Fatigue; Fever; Chills; Marked Weight Change Eyes Complaints and Symptoms: Positive for: Glasses / Contacts - reading Negative for: Dry Eyes; Vision Changes Medical History: Positive for: Cataracts - bil removed Negative for: Glaucoma; Optic Neuritis Ear/Nose/Mouth/Throat Complaints and Symptoms: Negative for: Chronic sinus problems or rhinitis Medical History: Positive for: Chronic sinus problems/congestion Negative for: Middle ear problems Past Medical History Notes: hard of hearing Respiratory Complaints and Symptoms: Negative for: Chronic or frequent coughs; Shortness of Breath Medical History: Positive for: Asthma Cardiovascular Complaints and Symptoms: Negative for: Chest pain Medical History: Positive for: Hypertension Past Medical History Notes: mitral valve disorder, pulmonary hypertension Gastrointestinal Complaints and Symptoms: Negative for: Frequent diarrhea; Nausea; Vomiting Endocrine Complaints and Symptoms: Negative for: Heat/cold intolerance Cheryl [name], Cheryl [name] ([id number]) [id number] Page 7 of 8 Medical History: Negative for: Type I Diabetes; Type II Diabetes Past Medical History Notes: hypothyroidism Integumentary (Skin) Complaints and Symptoms: Positive for: Wounds - right upper thigh Medical History: Negative for: History of Burn Musculoskeletal Complaints and Symptoms: Positive for: Muscle Weakness Negative for: Muscle Pain Medical  History: Positive for: Osteoarthritis Negative for: Gout; Rheumatoid Arthritis Past Medical  History Notes: osteoporesis Neurologic Complaints and Symptoms: Negative for: Numbness/parasthesias Psychiatric Complaints and Symptoms: Negative for: Claustrophobia Medical History: Negative for: Anorexia/bulimia; Confinement Anxiety Hematologic/Lymphatic Genitourinary Medical History: Negative for: End Stage Renal Disease Immunological Medical History: Negative for: Lupus Erythematosus; Raynauds; Scleroderma Oncologic Medical History: Negative for: Received Chemotherapy; Received Radiation HBO Extended History Items Ear/Nose/Mouth/Throat: Eyes: Chronic sinus Cataracts problems/congestion Immunizations Pneumococcal Vaccine: Received Pneumococcal Vaccination: Yes Received Pneumococcal Vaccination On or After 60th Birthday: No Implantable Devices No devices added Family and Social History Cancer: No; Diabetes: No; Heart Disease: Yes; Hereditary Spherocytosis: No; Hypertension: Yes - Mother; Kidney Disease: No; Lung Disease: No; Seizures: No; Stroke: No; Thyroid Problems: No; Tuberculosis: No; Never smoker; Marital Status - Married; Alcohol Use: Never; Drug Use: No History; Caffeine Use: Daily; Financial Concerns: No; Food, Clothing or Shelter Needs: No; Support System Lacking: No; Transportation Concerns: No Engineer, maintenance) Signed: 07/29/2022 12:35:47 PM By: Fredirick Maudlin MD FACS Signed: 07/29/2022 5:35:37 PM By: Baruch Gouty RN, BSN [facility], Cheryl [name] ([id number]) [id number] Page 8 of 8 Entered By: Baruch Gouty on 07/29/2022 11:25:29 -------------------------------------------------------------------------------- SuperBill Details Patient Name: Date of Service: ADOLPH, PLATANIA [place] [name]. 07/29/2022 Medical Record Number: [id number] Patient Account Number: [id number] Date of Birth/Sex: Treating RN: December 15, 1945 (77 y.o. 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 I10 Essential (primary) hypertension I27.20 Pulmonary hypertension, unspecified Facility Procedures : CPT4 Code: [id number] Description: 99214 - WOUND CARE VISIT-LEV 4 EST PT Modifier: Quantity: 1 Physician Procedures : CPT4 Code Description Modifier [id number] - WC PHYS LEVEL 4 - NEW PT ICD-10 Diagnosis Description L97.111 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

## 2022-07-30 NOTE — Progress Notes (Signed)
EZMERALDA, LUCERO E ([id number]) [phone].pdf Page 1 of 4 Visit Report for 07/29/2022 Abuse Risk Screen Details Patient Name: Date of Service: Cheryl [name], Cheryl [name] [place] E. 07/29/2022 10:45 A M Medical Record Number: [id number] Patient Account Number: [id number] Date of Birth/Sex: Treating RN: 1946/05/15 (77 y.o. Elam Dutch Primary Care Dedra Matsuo: Roe Coombs Other Clinician: Referring Jeramy Dimmick: Treating Mariana Goytia/Extender: Santiago Bur in Treatment: 0 Abuse Risk Screen Items Answer ABUSE RISK SCREEN: Has anyone close to you tried to hurt or harm you recentlyo No Do you feel uncomfortable with anyone in your familyo No Has anyone forced you do things that you didnt want to doo No Electronic Signature(s) Signed: 07/29/2022 5:35:37 PM By: Baruch Gouty RN, BSN Entered By: Baruch Gouty on 07/29/2022 11:25:38 -------------------------------------------------------------------------------- Activities of Daily Living Details Patient Name: Date of Service: Cheryl [name], Cheryl [name] [place] E. 07/29/2022 10:45 A M Medical Record Number: [id number] Patient Account Number: [id number] Date of Birth/Sex: Treating RN: 12/08/1945 (77 y.o. Elam Dutch Primary Care Ariella Voit: Roe Coombs Other Clinician: Referring Caylea Foronda: Treating Rozalynn Buege/Extender: Santiago Bur in Treatment: 0 Activities of Daily Living Items Answer Activities of Daily Living (Please select one for each item) Drive Automobile Not Able T Medications ake Need Assistance Use T elephone Completely Able Care for Appearance Completely Able Use T oilet Completely Able Bath / Shower Completely Able Dress Self Completely Able Feed Self Completely Able Walk Completely Able Get In / Out Bed Completely Able Housework Need Assistance Prepare Meals Not Able Handle Money Need Assistance Shop for Self Not Able Electronic Signature(s) Signed: 07/29/2022 5:35:37 PM By: Baruch Gouty RN, BSN Entered By: Baruch Gouty on 07/29/2022 11:26:25 Consuello Bossier ([id number]) [id number] Page 2 of 4 -------------------------------------------------------------------------------- Education Screening Details Patient Name: Date of Service: Cheryl [name], Cheryl [name] [place] E. 07/29/2022 10:45 A M Medical Record Number: [id number] Patient Account Number: [id number] Date of Birth/Sex: Treating RN: August 12, 1945 (77 y.o. Elam Dutch Primary Care Linzie Criss: Roe Coombs Other Clinician: Referring Riad Wagley: Treating Tresean Mattix/Extender: Santiago Bur in Treatment: 0 Primary Learner Assessed: Patient Learning Preferences/Education Level/Primary Language Learning Preference: Explanation, Demonstration, Printed Material Highest Education Level: College or Above Preferred Language: English Cognitive Barrier Language Barrier: No Translator Needed: No Memory Deficit: No Emotional Barrier: No Cultural/Religious Beliefs Affecting Medical Care: No Physical Barrier Impaired Vision: No Impaired Hearing: Yes Hearing Aid Decreased Hand dexterity: No Knowledge/Comprehension Knowledge Level: High Comprehension Level: High Ability to understand written instructions: High Ability to understand verbal instructions: High Motivation Anxiety Level: Calm Cooperation: Cooperative Education Importance: Acknowledges Need Interest in Health Problems: Asks Questions Perception: Coherent Willingness to Engage in Self-Management High Activities: Readiness to Engage in Self-Management High Activities: Electronic Signature(s) Signed: 07/29/2022 5:35:37 PM By: Baruch Gouty RN, BSN Entered By: Baruch Gouty on 07/29/2022 11:27:14 -------------------------------------------------------------------------------- Fall Risk Assessment Details Patient Name: Date of Service: Cheryl Knows, DO Cheryl E. 07/29/2022 10:45 A M Medical Record Number: [id number] Patient  Account Number: [id number] Date of Birth/Sex: Treating RN: [birth year] (77 y.o. Elam Dutch Primary Care Arliss Frisina: Roe Coombs Other Clinician: Referring Jaelen Gellerman: Treating Farzana Koci/Extender: Santiago Bur in Treatment: 0 Fall Risk Assessment Items Have you had 2 or more falls in the last 12 monthso 0 No Cheryl [name], Cheryl [name] E ([id number]) [id number] Page 3 of 4 Have you had any fall that resulted in injury in the last 12 monthso 0 No FALLS RISK SCREEN History of falling - immediate or within 3 months 0 No Secondary diagnosis (Do you have 2 or more  medical diagnoseso) 0 No Ambulatory aid None/bed rest/wheelchair/nurse 0 No Crutches/cane/walker 15 Yes Furniture 0 No Intravenous therapy Access/Saline/Heparin Lock 0 No Gait/Transferring Normal/ bed rest/ wheelchair 0 Yes Weak (short steps with or without shuffle, stooped but able to lift head while walking, may seek 0 No support from furniture) Impaired (short steps with shuffle, may have difficulty arising from chair, head down, impaired 0 No balance) Mental Status Oriented to own ability 0 Yes Electronic Signature(s) Signed: 07/29/2022 5:35:37 PM By: Baruch Gouty RN, BSN Entered By: Baruch Gouty on 07/29/2022 11:27:39 -------------------------------------------------------------------------------- Foot Assessment Details Patient Name: Date of Service: Cheryl Lucy Cheryl E. 07/29/2022 10:45 A M Medical Record Number: [id number] Patient Account Number: [id number] Date of Birth/Sex: Treating RN: 1945-11-29 (77 y.o. Elam Dutch Primary Care Arshad Oberholzer: Roe Coombs Other Clinician: Referring Loany Neuroth: Treating Manvi Guilliams/Extender: Santiago Bur in Treatment: 0 Foot Assessment Items Site Locations + = Sensation present, - = Sensation absent, C = Callus, U = Ulcer R = Redness, W = Warmth, M = Maceration, PU = Pre-ulcerative lesion F = Fissure, S =  Swelling, D = Dryness Assessment Right: Left: Other Deformity: No No Prior Foot Ulcer: No No Prior Amputation: No No Charcot Joint: No No Ambulatory Status: Ambulatory With Help Assistance Device: Cheryl [name], Cheryl [name] ([id number]) [phone].pdf Page 4 of 4 Gait: Steady Electronic Signature(s) Signed: 07/29/2022 5:35:37 PM By: Baruch Gouty RN, BSN Entered By: Baruch Gouty on 07/29/2022 11:28:28 -------------------------------------------------------------------------------- Nutrition Risk Screening Details Patient Name: Date of Service: Cheryl Lucy Cheryl E. 07/29/2022 10:45 A M Medical Record Number: [id number] Patient Account Number: [id number] Date of Birth/Sex: Treating RN: 1945-09-14 (77 y.o. Elam Dutch Primary Care Gudrun Axe: Roe Coombs Other Clinician: Referring Jerae Izard: Treating Camala Talwar/Extender: Santiago Bur in Treatment: 0 Height (in): 60 Weight (lbs): 116.5 Body Mass Index (BMI): 22.7 Nutrition Risk Screening Items Score Screening NUTRITION RISK SCREEN: I have an illness or condition that made me change the kind and/or amount of food I eat 0 No I eat fewer than two meals per day 0 No I eat few fruits and vegetables, or milk products 0 No I have three or more drinks of beer, liquor or wine almost every day 0 No I have tooth or mouth problems that make it hard for me to eat 0 No I don't always have enough money to buy the food I need 0 No I eat alone most of the time 0 No I take three or more different prescribed or over-the-counter drugs a day 1 Yes Without wanting to, I have lost or gained 10 pounds in the last six months 0 No I am not always physically able to shop, cook and/or feed myself 0 No Nutrition Protocols Good Risk Protocol 0 No interventions needed Moderate Risk Protocol High Risk Proctocol Risk Level: Good Risk Score: 1 Electronic Signature(s) Signed: 07/29/2022 5:35:37 PM By: Baruch Gouty RN, BSN Entered By: Baruch Gouty on 07/29/2022 11:28:07

## 2022-08-03 NOTE — Progress Notes (Signed)
HARU, MEINECKE E ([id number]) [id number] Page 1 of 11 Visit Report for 07/29/2022 Allergy List Details Patient Name: Date of Service: Cheryl [name], Cheryl [name] Cheryl E. 07/29/2022 10:45 A M Medical Record Number: [id number] Patient Account Number: [id number] Date of Birth/Sex: Treating RN: September 25, 1945 (78 y.o. Cheryl [name] Primary Care Cheryl [name]: Roe Coombs Other Clinician: Referring Cheryl [name]: Treating Cheryl [name] in Treatment: 0 Allergies Active Allergies aspirin codeine prednisone orange juice Allergy Notes Electronic Signature(s) Signed: 07/29/2022 5:35:37 PM By: Cheryl [name] Entered By: Cheryl Gouty on 07/29/2022 12:04:25 -------------------------------------------------------------------------------- Arrival Information Details Patient Name: Date of Service: Cheryl Lucy [name] 07/29/2022 10:45 A M Medical Record Number: [id number] Patient Account Number: [id number] Date of Birth/Sex: Treating RN: Jan 27, 1946 (77 y.o. F) Primary Care Amiyrah Lamere: Roe Coombs Other Clinician: Referring Cheryl [name]: Treating Cheryl [name]/Extender: Cheryl [name] in Treatment: 0 Visit Information Patient Arrived: Ambulatory Arrival Time: 11:04 Accompanied By: son Transfer Assistance: None Patient Identification Verified: Yes Secondary Verification Process Completed: Yes Patient Requires Transmission-Based Precautions: No Patient Has Alerts: No Electronic Signature(s) Signed: 07/29/2022 5:35:37 PM By: Cheryl [name] Entered By: Cheryl Gouty on 07/29/2022 11:13:38 Cheryl [name] ([id number]) [id number] Page 2 of 11 -------------------------------------------------------------------------------- Clinic Level of Care Assessment Details Patient Name: Date of Service: [name], CHARLESTON Cheryl E. 07/29/2022 10:45 A M Medical Record Number: [id number] Patient Account Number: [id number] Date of  Birth/Sex: Treating RN: Nov 02, 1945 (77 y.o. Cheryl [name]: Roe Coombs Other Clinician: Referring Tavi Gaughran: Treating Cheryl [name]/Extender: Cheryl [name] in Treatment: 0 Clinic Level of Care Assessment Items TOOL 2 Quantity Score []$  - 0 Use when only an EandM is performed on the INITIAL visit ASSESSMENTS - Nursing Assessment / Reassessment X- 1 20 General Physical Exam (combine w/ comprehensive assessment (listed just below) when performed on new pt. evals) X- 1 25 Comprehensive Assessment (HX, ROS, Risk Assessments, Wounds Hx, etc.) ASSESSMENTS - Wound and Skin A ssessment / Reassessment []$  - 0 Simple Wound Assessment / Reassessment - one wound X- 2 5 Complex Wound Assessment / Reassessment - multiple wounds []$  - 0 Dermatologic / Skin Assessment (not related to wound area) ASSESSMENTS - Ostomy and/or Continence Assessment and Care []$  - 0 Incontinence Assessment and Management []$  - 0 Ostomy Care Assessment and Management (repouching, etc.) PROCESS - Coordination of Care X - Simple Patient / Family Education for ongoing care 1 15 []$  - 0 Complex (extensive) Patient / Family Education for ongoing care X- 1 10 Staff obtains Programmer, systems, Records, T Results / Process Orders est []$  - 0 Staff telephones HHA, Nursing Homes / Clarify orders / etc []$  - 0 Routine Transfer to another Facility (non-emergent condition) []$  - 0 Routine Hospital Admission (non-emergent condition) X- 1 15 New Admissions / Biomedical engineer / Ordering NPWT Apligraf, etc. , []$  - 0 Emergency Hospital Admission (emergent condition) X- 1 10 Simple Discharge Coordination []$  - 0 Complex (extensive) Discharge Coordination PROCESS - Special Needs []$  - 0 Pediatric / Minor Patient Management []$  - 0 Isolation Patient Management []$  - 0 Hearing / Language / Visual special needs []$  - 0 Assessment of Community assistance (transportation, D/C planning, etc.) []$   - 0 Additional assistance / Altered mentation []$  - 0 Support Surface(s) Assessment (bed, cushion, seat, etc.) INTERVENTIONS - Wound Cleansing / Measurement X- 1 5 Wound Imaging (photographs - any number of wounds) []$  - 0 Wound Tracing (instead of photographs) []$  - 0 Simple Wound Measurement - one wound X- 2 5 Complex Wound  Measurement - multiple wounds []$  - 0 Simple Wound Cleansing - one wound Cheryl [name], Cheryl [name] ([id number]) [id number] Page 3 of 11 X- 2 5 Complex Wound Cleansing - multiple wounds INTERVENTIONS - Wound Dressings X - Small Wound Dressing one or multiple wounds 2 10 []$  - 0 Medium Wound Dressing one or multiple wounds []$  - 0 Large Wound Dressing one or multiple wounds []$  - 0 Application of Medications - injection INTERVENTIONS - Miscellaneous []$  - 0 External ear exam []$  - 0 Specimen Collection (cultures, biopsies, blood, body fluids, etc.) []$  - 0 Specimen(s) / Culture(s) sent or taken to Lab for analysis []$  - 0 Patient Transfer (multiple staff / Civil Service fast streamer / Similar devices) []$  - 0 Simple Staple / Suture removal (25 or less) []$  - 0 Complex Staple / Suture removal (26 or more) []$  - 0 Hypo / Hyperglycemic Management (close monitor of Blood Glucose) []$  - 0 Ankle / Brachial Index (ABI) - do not check if billed separately Has the patient been seen at the hospital within the last three years: Yes Total Score: 150 Level Of Care: New/Established - Level 4 Electronic Signature(s) Signed: 07/29/2022 5:35:37 PM By: Cheryl [name] Entered By: Cheryl Gouty on 07/29/2022 12:01:56 -------------------------------------------------------------------------------- Encounter Discharge Information Details Patient Name: Date of Service: Cheryl Lucy [name] 07/29/2022 10:45 A M Medical Record Number: [id number] Patient Account Number: [id number] Date of Birth/Sex: Treating RN: 1945/09/02 (77 y.o. Cheryl [name] Primary Care Adamariz Gillott:  Roe Coombs Other Clinician: Referring Clester Chlebowski: Treating Tyne Banta/Extender: Cheryl [name] in Treatment: 0 Encounter Discharge Information Items Discharge Condition: Stable Ambulatory Status: Cane Discharge Destination: Home Transportation: Private Auto Accompanied By: spouse Schedule Follow-up Appointment: Yes Clinical Summary of Care: Patient Declined Electronic Signature(s) Signed: 07/29/2022 5:35:37 PM By: Cheryl [name] Entered By: Cheryl Gouty on 07/29/2022 12:05:08 -------------------------------------------------------------------------------- Lower Extremity Assessment Details Patient Name: Date of Service: Cheryl Lucy [name] 07/29/2022 10:45 A Rutherford Guys, Riley Lam ([id number]) [id number] Page 4 of 11 Medical Record Number: [id number] Patient Account Number: [id number] Date of Birth/Sex: Treating RN: 10/08/1945 (77 y.o. Cheryl [name] Primary Care Carmino Ocain: Roe Coombs Other Clinician: Referring Lidya Mccalister: Treating Taye Cato/Extender: Cheryl [name] in Treatment: 0 Edema Assessment Assessed: [name]: No] [Right: No] Edema: [Left: No] [Right: No] Calf Left: Right: Point of Measurement: From Medial Instep 30.5 cm 30 cm Ankle Left: Right: Point of Measurement: From Medial Instep 18 cm 17.5 cm Vascular Assessment Pulses: Dorsalis Pedis Palpable: [Left:Yes] [Right:Yes] Electronic Signature(s) Signed: 07/29/2022 5:35:37 PM By: Cheryl [name] Entered By: Cheryl Gouty on 07/29/2022 11:30:12 -------------------------------------------------------------------------------- Multi Wound Chart Details Patient Name: Date of Service: Cheryl Lucy [name] 07/29/2022 10:45 A M Medical Record Number: [id number] Patient Account Number: [id number] Date of Birth/Sex: Treating RN: 01/26/1946 (77 y.o. F) Primary Care Girtie Wiersma: Roe Coombs Other Clinician: Referring Swanson Farnell: Treating  Katherin Ramey/Extender: Cheryl [name] in Treatment: 0 Vital Signs Height(in): 60 Pulse(bpm): 50 Weight(lbs): 116.5 Blood Pressure(mmHg): 179/65 Body Mass Index(BMI): 22.7 Temperature(F): 97.7 Respiratory Rate(breaths/min): 20 [1:Photos:] [N/A:N/A] Left, Medial Upper Leg Left, Medial Upper Leg N/A Wound Location: Blister Blister N/A Wounding Event: Atypical Atypical N/A Primary Etiology: Cataracts, Chronic sinus Cataracts, Chronic sinus N/A Comorbid History: problems/congestion, Asthma, problems/congestion, Asthma, Hypertension, Osteoarthritis Hypertension, Osteoarthritis 07/25/2022 07/25/2022 N/A Date Acquired: 0 0 N/A [name] of Treatment: Open Open N/A Wound Status: GERRY, KIPPS ([id number]) [id number] Page 5 of 11 No No N/A Wound Recurrence: 0.9x1x0.1 2.6x1.4x0.1 N/A Measurements L x W x D (cm) 0.707  2.859 N/A A (cm) : rea 0.071 0.286 N/A Volume (cm) : Partial Thickness Partial Thickness N/A Classification: Small None Present N/A Exudate A mount: Serous N/A N/A Exudate Type: amber N/A N/A Exudate Color: Flat and Intact Flat and Intact N/A Wound Margin: Large (67-100%) None Present (0%) N/A Granulation A mount: Pink N/A N/A Granulation Quality: None Present (0%) None Present (0%) N/A Necrotic A mount: Fascia: No N/A N/A Exposed Structures: Fat Layer (Subcutaneous Tissue): No Tendon: No Muscle: No Joint: No Bone: No Limited to Skin Breakdown Large (67-100%) Large (67-100%) N/A Epithelialization: Excoriation: No No Abnormalities Noted N/A Periwound Skin Texture: Induration: No Callus: No Crepitus: No Rash: No Scarring: No Maceration: No No Abnormalities Noted N/A Periwound Skin Moisture: Dry/Scaly: No Hemosiderin Staining: Yes Hemosiderin Staining: Yes N/A Periwound Skin Color: Atrophie Blanche: No Cyanosis: No Ecchymosis: No Erythema: No Mottled: No Pallor: No Rubor: No No Abnormality No  Abnormality N/A Temperature: Yes Yes N/A Tenderness on Palpation: N/A blister in tact N/A Assessment Notes: Treatment Notes Wound #1 (Upper Leg) Wound Laterality: Left, Medial Cleanser Peri-Wound Care Topical Triamcinolone Discharge Instruction: Apply Triamcinolone as directed Primary Dressing Sorbalgon AG Dressing, 4x4 (in/in) Discharge Instruction: Apply to wound bed as instructed Secondary Dressing Zetuvit Plus Silicone Border Dressing 4x4 (in/in) Discharge Instruction: Apply silicone border over primary dressing as directed. Secured With Compression Wrap Compression Stockings Add-Ons Wound #2 (Upper Leg) Wound Laterality: Left, Medial Cleanser Peri-Wound Care Topical Triamcinolone Discharge Instruction: Apply Triamcinolone as directed Primary Dressing Sorbalgon AG Dressing, 4x4 (in/in) Discharge Instruction: Apply to wound bed as instructed DVORA, ROESNER ([id number]) [id number] Page 6 of 11 Secondary Dressing Zetuvit Plus Silicone Border Dressing 4x4 (in/in) Discharge Instruction: Apply silicone border over primary dressing as directed. Secured With Compression Wrap Compression Stockings Environmental education officer) Signed: 07/29/2022 12:17:57 PM By: Fredirick Maudlin MD FACS Entered By: Fredirick Maudlin on 07/29/2022 12:17:57 -------------------------------------------------------------------------------- Multi-Disciplinary Care Plan Details Patient Name: Date of Service: Cheryl Lucy [name] 07/29/2022 10:45 A M Medical Record Number: [id number] Patient Account Number: [id number] Date of Birth/Sex: Treating RN: 1946/02/05 (77 y.o. Cheryl [name] Primary Care Kayvion Arneson: Roe Coombs Other Clinician: Referring Meryle Pugmire: Treating Mahin Guardia/Extender: Cheryl [name] in Treatment: 0 Multidisciplinary Care Plan reviewed with physician Active Inactive Wound/Skin Impairment Nursing Diagnoses: Impaired tissue  integrity Knowledge deficit related to ulceration/compromised skin integrity Goals: Patient/caregiver will verbalize understanding of skin care regimen Date Initiated: 07/29/2022 Target Resolution Date: 08/26/2022 Goal Status: Active Ulcer/skin breakdown will have a volume reduction of 30% by week 4 Date Initiated: 07/29/2022 Target Resolution Date: 08/26/2022 Goal Status: Active Interventions: Assess patient/caregiver ability to obtain necessary supplies Assess patient/caregiver ability to perform ulcer/skin care regimen upon admission and as needed Assess ulceration(s) every visit Provide education on ulcer and skin care Treatment Activities: Skin care regimen initiated : 07/29/2022 Topical wound management initiated : 07/29/2022 Notes: Electronic Signature(s) Signed: 07/29/2022 5:35:37 PM By: Cheryl [name] Entered By: Cheryl Gouty on 07/29/2022 11:59:50 Cheryl [name] ([id number]) [id number] Page 7 of 11 -------------------------------------------------------------------------------- Pain Assessment Details Patient Name: Date of Service: Cheryl [name], Cheryl [name] Cheryl E. 07/29/2022 10:45 A M Medical Record Number: [id number] Patient Account Number: [id number] Date of Birth/Sex: Treating RN: Oct 23, 1945 (77 y.o. F) Primary Care Kyrene Longan: Roe Coombs Other Clinician: Referring Marlos Carmen: Treating Laqueta Bonaventura/Extender: Cheryl [name] in Treatment: 0 Active Problems Location of Pain Severity and Description of Pain Patient Has Paino No Site Locations Pain Management and Medication Current Pain Management: Electronic Signature(s) Signed: 07/29/2022 11:27:31 AM By: Worthy Rancher  Entered By: Worthy Rancher on 07/29/2022 11:05:50 -------------------------------------------------------------------------------- Patient/Caregiver Education Details Patient Name: Date of Service: Cheryl [name], Cheryl [name] [place] 2/9/2024andnbsp10:45 [place] Record Number:  [id number] Patient Account Number: [id number] Date of Birth/Gender: Treating RN: July 22, 1945 (77 y.o. Cheryl [name] Primary Care Physician: Roe Coombs Other Clinician: Referring Physician: Treating Physician/Extender: Cheryl [name] in Treatment: 0 Education Assessment Education Provided To: Patient Education Topics Provided Welcome [facility] Packet: o Handouts: Welcome T [facility] o Methods: Explain/Verbal, Printed Responses: Reinforcements needed, State content correctly Cheryl [name], Cheryl [name] ([id number]) [id number] Page 8 of 11 Wound/Skin Impairment: Handouts: Caring for Your Ulcer Methods: Explain/Verbal, Printed Responses: Reinforcements needed, State content correctly Electronic Signature(s) Signed: 07/29/2022 5:35:37 PM By: Cheryl [name] Entered By: Cheryl Gouty on 07/29/2022 11:48:10 -------------------------------------------------------------------------------- Wound Assessment Details Patient Name: Date of Service: Cheryl Lucy [name] 07/29/2022 10:45 A M Medical Record Number: [id number] Patient Account Number: [id number] Date of Birth/Sex: Treating RN: [birth year] (77 y.o. Martyn Malay, Linda Primary Care Berklee Battey: Roe Coombs Other Clinician: Referring Vista Sawatzky: Treating Catarino Vold/Extender: Cheryl [name] in Treatment: 0 Wound Status Wound Number: 1 Primary Atypical Etiology: Wound Location: Left, Medial Upper Leg Wound Open Wounding Event: Blister Status: Date Acquired: 07/25/2022 Comorbid Cataracts, Chronic sinus problems/congestion, Asthma, [name] Of Treatment: 0 History: Hypertension, Osteoarthritis Clustered Wound: No Photos Wound Measurements Length: (cm) 0.9 Width: (cm) 1 Depth: (cm) 0.1 Area: (cm) 0.707 Volume: (cm) 0.071 % Reduction in Area: % Reduction in Volume: Epithelialization: Large (67-100%) Tunneling: No Undermining:  No Wound Description Classification: Partial Thickness Wound Margin: Flat and Intact Exudate Amount: Small Exudate Type: Serous Exudate Color: amber Foul Odor After Cleansing: No Slough/Fibrino No Wound Bed Granulation Amount: Large (67-100%) Exposed Structure Granulation Quality: Pink Fascia Exposed: No Necrotic Amount: None Present (0%) Fat Layer (Subcutaneous Tissue) Exposed: No Tendon Exposed: No Muscle Exposed: No Joint Exposed: No Bone Exposed: No Limited to Skin Cheryl [name], Cheryl [name] ([id number]) [id number] Page 9 of 11 Periwound Skin Texture Texture Color No Abnormalities Noted: Yes No Abnormalities Noted: No Atrophie Blanche: No Moisture Cyanosis: No No Abnormalities Noted: No Ecchymosis: No Dry / Scaly: No Erythema: No Maceration: No Hemosiderin Staining: Yes Mottled: No Pallor: No Rubor: No Temperature / Pain Temperature: No Abnormality Tenderness on Palpation: Yes Treatment Notes Wound #1 (Upper Leg) Wound Laterality: Left, Medial Cleanser Peri-Wound Care Topical Triamcinolone Discharge Instruction: Apply Triamcinolone as directed Primary Dressing Sorbalgon AG Dressing, 4x4 (in/in) Discharge Instruction: Apply to wound bed as instructed Secondary Dressing Zetuvit Plus Silicone Border Dressing 4x4 (in/in) Discharge Instruction: Apply silicone border over primary dressing as directed. Secured With Compression Wrap Compression Stockings Environmental education officer) Signed: 07/29/2022 5:35:37 PM By: Cheryl [name] Signed: 08/03/2022 9:51:04 AM By: Sandre Kitty Entered By: Sandre Kitty on 07/29/2022 11:38:16 -------------------------------------------------------------------------------- Wound Assessment Details Patient Name: Date of Service: Cheryl Lucy [name] 07/29/2022 10:45 A M Medical Record Number: [id number] Patient Account Number: [id number] Date of Birth/Sex: Treating RN: [birth year] (77 y.o. Cheryl [name] Primary Care Sweet Jarvis: Roe Coombs Other Clinician: Referring Shayra Anton: Treating Alese Furniss/Extender: Cheryl [name] in Treatment: 0 Wound Status Wound Number: 2 Primary Atypical Etiology: Wound Location: Left, Medial Upper Leg Wound Open Wounding Event: Blister Status: Date Acquired: 07/25/2022 Comorbid Cataracts, Chronic sinus problems/congestion, Asthma, [name] Of Treatment: 0 History: Hypertension, Osteoarthritis Clustered Wound: No Photos Cheryl [name], Cheryl [name] ([id number]) [id number] Page 10 of 11 Wound Measurements Length: (cm) 2.6 Width: (cm) 1.4 Depth: (cm) 0.1 Area: (  cm) 2.859 Volume: (cm) 0.286 % Reduction in Area: % Reduction in Volume: Epithelialization: Large (67-100%) Tunneling: No Undermining: No Wound Description Classification: Partial Thickness Wound Margin: Flat and Intact Exudate Amount: None Present Foul Odor After Cleansing: No Slough/Fibrino No Wound Bed Granulation Amount: None Present (0%) Necrotic Amount: None Present (0%) Periwound Skin Texture Texture Color No Abnormalities Noted: Yes No Abnormalities Noted: No Hemosiderin Staining: Yes Moisture No Abnormalities Noted: Yes Temperature / Pain Temperature: No Abnormality Tenderness on Palpation: Yes Assessment Notes blister in tact Treatment Notes Wound #2 (Upper Leg) Wound Laterality: Left, Medial Cleanser Peri-Wound Care Topical Triamcinolone Discharge Instruction: Apply Triamcinolone as directed Primary Dressing Sorbalgon AG Dressing, 4x4 (in/in) Discharge Instruction: Apply to wound bed as instructed Secondary Dressing Zetuvit Plus Silicone Border Dressing 4x4 (in/in) Discharge Instruction: Apply silicone border over primary dressing as directed. Secured With Compression Wrap Compression Stockings Environmental education officer) Signed: 07/29/2022 5:35:37 PM By: Cheryl [name] Signed: 08/03/2022 9:51:04 AM  By: Sandre Kitty Entered By: Sandre Kitty on 07/29/2022 11:38:44 Cheryl [name] ([id number]) [id number] Page 11 of 11 -------------------------------------------------------------------------------- Vitals Details Patient Name: Date of Service: Cheryl [name], Cheryl [name] Cheryl E. 07/29/2022 10:45 A M Medical Record Number: [id number] Patient Account Number: [id number] Date of Birth/Sex: Treating RN: 12-07-1945 (77 y.o. F) Primary Care Maeve Debord: Roe Coombs Other Clinician: Referring Lorette Peterkin: Treating Junelle Hashemi/Extender: Cheryl [name] in Treatment: 0 Vital Signs Time Taken: 11:04 Temperature (F): 97.7 Height (in): 60 Pulse (bpm): 50 Weight (lbs): 116.5 Respiratory Rate (breaths/min): 20 Body Mass Index (BMI): 22.7 Blood Pressure (mmHg): 179/65 Reference Range: 80 - 120 mg / dl Electronic Signature(s) Signed: 07/29/2022 5:35:37 PM By: Cheryl [name] Entered By: Cheryl Gouty on 07/29/2022 11:14:20

## 2022-08-12 ENCOUNTER — Encounter (HOSPITAL_BASED_OUTPATIENT_CLINIC_OR_DEPARTMENT_OTHER): Payer: Medicare HMO | Admitting: General Surgery

## 2022-08-12 DIAGNOSIS — L97221 Non-pressure chronic ulcer of left calf limited to breakdown of skin: Secondary | ICD-10-CM | POA: Diagnosis not present

## 2022-08-13 NOTE — Progress Notes (Signed)
Cheryl [name] ([id number]) [id number] Page 1 of 12 Visit Report for 08/12/2022 Arrival Information Details Patient Name: Date of Service: Cheryl [name], Cheryl [name] [place] [name]. 08/12/2022 12:30 PM Medical Record Number: [id number] Patient Account Number: [id number] Date of Birth/Sex: Treating RN: Oct 22, 1945 (77 y.o. Iver Nestle, [place] Primary Care Earlin Sweeden: Roe Coombs Other Clinician: Referring Ranjit Ashurst: Treating Manila Rommel/Extender: Santiago Bur in Treatment: 2 Visit Information History Since Last Visit Added or deleted any medications: No Patient Arrived: [name] Any new allergies or adverse reactions: No Arrival Time: 12:39 Had a fall or experienced change in No Accompanied By: self activities of daily living that may affect Transfer Assistance: None risk of falls: Patient Identification Verified: Yes Signs or symptoms of abuse/neglect since last visito No Secondary Verification Process Completed: Yes Hospitalized since last visit: No Patient Requires Transmission-Based Precautions: No Implantable device outside of the clinic excluding No Patient Has Alerts: No cellular tissue based products placed in the center since last visit: Has Dressing in Place as Prescribed: No Pain Present Now: No Electronic Signature(s) Signed: 08/12/2022 3:38:57 PM By: Blanche East RN Entered By: Blanche East on 08/12/2022 12:40:22 -------------------------------------------------------------------------------- Clinic Level of Care Assessment Details Patient Name: Date of Service: Cheryl [name] [place] [name]. 08/12/2022 12:30 PM Medical Record Number: [id number] Patient Account Number: [id number] Date of Birth/Sex: Treating RN: September 19, 1945 (77 y.o. Iver Nestle, [place] Primary Care Cherron Blitzer: Roe Coombs Other Clinician: Referring Pattricia Weiher: Treating Eulanda Dorion/Extender: Santiago Bur in Treatment: 2 Clinic Level of Care Assessment Items TOOL 4 Quantity Score X-  1 0 Use when only an EandM is performed on FOLLOW-UP visit ASSESSMENTS - Nursing Assessment / Reassessment X- 1 10 Reassessment of Co-morbidities (includes updates in patient status) X- 1 5 Reassessment of Adherence to Treatment Plan ASSESSMENTS - Wound and Skin A ssessment / Reassessment X - Simple Wound Assessment / Reassessment - one wound 1 5 '[]'$  - 0 Complex Wound Assessment / Reassessment - multiple wounds '[]'$  - 0 Dermatologic / Skin Assessment (not related to wound area) ASSESSMENTS - Focused Assessment X- 1 5 Circumferential Edema Measurements - multi extremities '[]'$  - 0 Nutritional Assessment / Counseling / Intervention '[]'$  - 0 Lower Extremity Assessment (monofilament, tuning fork, pulses) '[]'$  - 0 Peripheral Arterial Disease Assessment (using hand held doppler) ASSESSMENTS - Ostomy and/or Continence Assessment and Care '[]'$  - 0 Incontinence Assessment and Management '[]'$  - 0 Ostomy Care Assessment and Management (repouching, etc.) PROCESS - Coordination of Care X - Simple Patient / Family Education for ongoing care 1 15 [place], [place] [name] ([id number]) [phone].pdf Page 2 of 12 '[]'$  - 0 Complex (extensive) Patient / Family Education for ongoing care X- 1 10 Staff obtains Consents, Records, T Results / Process Orders est X- 1 10 Staff telephones HHA, Nursing Homes / Clarify orders / etc '[]'$  - 0 Routine Transfer to another Facility (non-emergent condition) '[]'$  - 0 Routine Hospital Admission (non-emergent condition) '[]'$  - 0 New Admissions / Biomedical engineer / Ordering NPWT Apligraf, etc. , '[]'$  - 0 Emergency Hospital Admission (emergent condition) '[]'$  - 0 Simple Discharge Coordination '[]'$  - 0 Complex (extensive) Discharge Coordination PROCESS - Special Needs '[]'$  - 0 Pediatric / Minor Patient Management '[]'$  - 0 Isolation Patient Management '[]'$  - 0 Hearing / Language / Visual special needs '[]'$  - 0 Assessment of Community assistance (transportation,  D/C planning, etc.) '[]'$  - 0 Additional assistance / Altered mentation '[]'$  - 0 Support Surface(s) Assessment (bed, cushion, seat, etc.) INTERVENTIONS - Wound Cleansing / Measurement X - Simple Wound Cleansing -  one wound 1 5 '[]'$  - 0 Complex Wound Cleansing - multiple wounds X- 1 5 Wound Imaging (photographs - any number of wounds) '[]'$  - 0 Wound Tracing (instead of photographs) X- 1 5 Simple Wound Measurement - one wound '[]'$  - 0 Complex Wound Measurement - multiple wounds INTERVENTIONS - Wound Dressings X - Small Wound Dressing one or multiple wounds 1 10 '[]'$  - 0 Medium Wound Dressing one or multiple wounds '[]'$  - 0 Large Wound Dressing one or multiple wounds '[]'$  - 0 Application of Medications - topical '[]'$  - 0 Application of Medications - injection INTERVENTIONS - Miscellaneous '[]'$  - 0 External ear exam '[]'$  - 0 Specimen Collection (cultures, biopsies, blood, body fluids, etc.) '[]'$  - 0 Specimen(s) / Culture(s) sent or taken to Lab for analysis '[]'$  - 0 Patient Transfer (multiple staff / Civil Service fast streamer / Similar devices) '[]'$  - 0 Simple Staple / Suture removal (25 or less) '[]'$  - 0 Complex Staple / Suture removal (26 or more) '[]'$  - 0 Hypo / Hyperglycemic Management (close monitor of Blood Glucose) '[]'$  - 0 Ankle / Brachial Index (ABI) - do not check if billed separately X- 1 5 Vital Signs Has the patient been seen at the hospital within the last three years: Yes Total Score: 90 Level Of Care: New/Established - Level 3 Electronic Signature(s) Signed: 08/12/2022 3:38:57 PM By: Blanche East RN Entered By: Blanche East on 08/12/2022 13:07:59 Consuello Bossier ([id number] Page 3 of 12 -------------------------------------------------------------------------------- Encounter Discharge Information Details Patient Name: Date of Service: Cheryl [name] [place] [name]. 08/12/2022 12:30 PM Medical Record Number: [id number] Patient Account Number: [id number] Date of Birth/Sex:  Treating RN: [birth year] (77 y.o. Marta Lamas Primary Care Delshawn Stech: Roe Coombs Other Clinician: Referring Macio Kissoon: Treating Aerie Donica/Extender: Santiago Bur in Treatment: 2 Encounter Discharge Information Items Discharge Condition: Stable Ambulatory Status: Cane Discharge Destination: Home Transportation: Private Auto Accompanied By: son Schedule Follow-up Appointment: No Clinical Summary of Care: Electronic Signature(s) Signed: 08/12/2022 1:07:21 PM By: Blanche East RN Entered By: Blanche East on 08/12/2022 13:07:21 -------------------------------------------------------------------------------- Lower Extremity Assessment Details Patient Name: Date of Service: Teryl Lucy NNA [name]. 08/12/2022 12:30 PM Medical Record Number: [id number] Patient Account Number: [id number] Date of Birth/Sex: Treating RN: 19-Nov-1945 (77 y.o. Marta Lamas Primary Care Deshay Kirstein: Roe Coombs Other Clinician: Referring Tulsi Crossett: Treating Theron Cumbie/Extender: Santiago Bur in Treatment: 2 Edema Assessment Assessed: [Left: No] [Right: No] Edema: [Left: No] [Right: No] Calf Left: Right: Point of Measurement: From Medial Instep 31.5 cm 31 cm Ankle Left: Right: Point of Measurement: From Medial Instep 19 cm 18.5 cm Vascular Assessment Pulses: Dorsalis Pedis Palpable: [Left:Yes] [Right:Yes] Electronic Signature(s) Signed: 08/12/2022 3:38:57 PM By: Blanche East RN Entered By: Blanche East on 08/12/2022 12:43:21 -------------------------------------------------------------------------------- Multi Wound Chart Details Patient Name: Date of Service: Teryl Lucy NNA [name]. 08/12/2022 12:30 PM Medical Record Number: [id number] Patient Account Number: [id number] Date of Birth/Sex: Treating RN: 1945/06/28 (77 y.o. F) Primary Care Kelce Bouton: Roe Coombs Other Clinician: Referring Tram Wrenn: Treating Ji Fairburn/Extender: Santiago Bur in  Treatment: 2 ELDINE, LIVA ([id number]) [id number] Page 4 of 12 Vital Signs Height(in): 60 Pulse(bpm): 76 Weight(lbs): 116.5 Blood Pressure(mmHg): 178/92 Body Mass Index(BMI): 22.7 Temperature(F): 97.7 Respiratory Rate(breaths/min): 18 [1:Photos:] Left, Medial Upper Leg Right, Medial Upper Leg Left, Medial Lower Leg Wound Location: Blister Blister Blister Wounding Event: Atypical Atypical Venous Leg Ulcer Primary Etiology: Cataracts, Chronic sinus Cataracts, Chronic sinus Cataracts, Chronic sinus Comorbid History: problems/congestion, Asthma, problems/congestion, Asthma, problems/congestion, Asthma, Hypertension, Osteoarthritis Hypertension, Osteoarthritis  Hypertension, Osteoarthritis 07/25/2022 07/25/2022 08/05/2022 Date Acquired: 2 2 0 Weeks of Treatment: Open Open Open Wound Status: No No No Wound Recurrence: 0.5x0.6x0.1 1.2x0.8x0.1 2.5x2.8x0.1 Measurements L x W x D (cm) 0.236 0.754 5.498 A (cm) : rea 0.024 0.075 0.55 Volume (cm) : 66.60% 73.60% N/A % Reduction in Area: 66.20% 73.80% N/A % Reduction in Volume: Partial Thickness Partial Thickness Full Thickness Without Exposed Classification: Support Structures Small Medium Medium Exudate A mount: Serous Serous Serous Exudate Type: amber amber amber Exudate Color: Flat and Intact Flat and Intact N/A Wound Margin: Large (67-100%) Large (67-100%) Large (67-100%) Granulation Amount: Pink Pink Pink Granulation Quality: None Present (0%) Small (1-33%) Small (1-33%) Necrotic Amount: N/A Eschar Eschar, Adherent Slough Necrotic Tissue: Fascia: No Fat Layer (Subcutaneous Tissue): Yes Fat Layer (Subcutaneous Tissue): Yes Exposed Structures: Fat Layer (Subcutaneous Tissue): No Fascia: No Fascia: No Tendon: No Tendon: No Tendon: No Muscle: No Muscle: No Muscle: No Joint: No Joint: No Joint: No Bone: No Bone: No Bone: No Limited to Skin Breakdown Small (1-33%) Large  (67-100%) Small (1-33%) Epithelialization: Excoriation: No No Abnormalities Noted No Abnormalities Noted Periwound Skin Texture: Induration: No Callus: No Crepitus: No Rash: No Scarring: No Maceration: No No Abnormalities Noted Maceration: No Periwound Skin Moisture: Dry/Scaly: No Dry/Scaly: No Hemosiderin Staining: Yes Hemosiderin Staining: Yes Rubor: Yes Periwound Skin Color: Atrophie Blanche: No Cyanosis: No Ecchymosis: No Erythema: No Mottled: No Pallor: No Rubor: No No Abnormality No Abnormality No Abnormality Temperature: Yes Yes Yes Tenderness on Palpation: Treatment Notes Wound #1 (Upper Leg) Wound Laterality: Left, Medial Cleanser Peri-Wound Care Topical Triamcinolone Discharge Instruction: Apply Triamcinolone as directed DAWAN, BETRO ([id number]) [phone] Page 5 of 12 Primary Dressing Sorbalgon AG Dressing, 4x4 (in/in) Discharge Instruction: Apply to wound bed as instructed Secondary Dressing Zetuvit Plus Silicone Border Dressing 4x4 (in/in) Discharge Instruction: Apply silicone border over primary dressing as directed. Secured With Compression Wrap Compression Stockings Add-Ons Wound #2 (Upper Leg) Wound Laterality: Right, Medial Cleanser Peri-Wound Care Topical Triamcinolone Discharge Instruction: Apply Triamcinolone as directed Primary Dressing Sorbalgon AG Dressing, 4x4 (in/in) Discharge Instruction: Apply to wound bed as instructed Secondary Dressing Zetuvit Plus Silicone Border Dressing 4x4 (in/in) Discharge Instruction: Apply silicone border over primary dressing as directed. Secured With Compression Wrap Compression Stockings Add-Ons Wound #3 (Lower Leg) Wound Laterality: Left, Medial Cleanser Peri-Wound Care Topical Triamcinolone Discharge Instruction: Apply Triamcinolone as directed Primary Dressing Sorbalgon AG Dressing, 4x4 (in/in) Discharge Instruction: Apply to wound bed as  instructed Secondary Dressing Zetuvit Plus Silicone Border Dressing 4x4 (in/in) Discharge Instruction: Apply silicone border over primary dressing as directed. Secured With Compression Wrap Compression Stockings Environmental education officer) Signed: 08/12/2022 1:13:45 PM By: Fredirick Maudlin MD FACS Entered By: Fredirick Maudlin on 08/12/2022 13:13:45 Consuello Bossier ([id number] Page 6 of 12 -------------------------------------------------------------------------------- Multi-Disciplinary Care Plan Details Patient Name: Date of Service: MARCINA, RISCH [place] [name]. 08/12/2022 12:30 PM Medical Record Number: [id number] Patient Account Number: [id number] Date of Birth/Sex: Treating RN: 04-May-1946 (77 y.o. Marta Lamas Primary Care Nadalee Neiswender: Roe Coombs Other Clinician: Referring Anhar Mcdermott: Treating Trudie Cervantes/Extender: Santiago Bur in Treatment: 2 Multidisciplinary Care Plan reviewed with physician Active Inactive Wound/Skin Impairment Nursing Diagnoses: Impaired tissue integrity Knowledge deficit related to ulceration/compromised skin integrity Goals: Patient/caregiver will verbalize understanding of skin care regimen Date Initiated: 07/29/2022 Target Resolution Date: 08/26/2022 Goal Status: Active Ulcer/skin breakdown will have a volume reduction of 30% by week 4 Date Initiated: 07/29/2022 Target Resolution Date: 08/26/2022 Goal Status: Active Interventions: Assess patient/caregiver ability to  obtain necessary supplies Assess patient/caregiver ability to perform ulcer/skin care regimen upon admission and as needed Assess ulceration(s) every visit Provide education on ulcer and skin care Treatment Activities: Skin care regimen initiated : 07/29/2022 Topical wound management initiated : 07/29/2022 Notes: Electronic Signature(s) Signed: 08/12/2022 12:54:06 PM By: Blanche East RN Entered By: Blanche East on 08/12/2022  12:54:06 -------------------------------------------------------------------------------- Pain Assessment Details Patient Name: Date of Service: Teryl Lucy NNA [name]. 08/12/2022 12:30 PM Medical Record Number: [id number] Patient Account Number: [id number] Date of Birth/Sex: Treating RN: [birth year] (77 y.o. Marta Lamas Primary Care Dhiren Azimi: Roe Coombs Other Clinician: Referring Andrez Lieurance: Treating Nalani Andreen/Extender: Santiago Bur in Treatment: 2 Active Problems Location of Pain Severity and Description of Pain Patient Has Paino No Site Locations Rate the pain. MICHELENA, SIBAYAN [name] ([id number]) [id number] Page 7 of 12 Rate the pain. Current Pain Level: 0 Pain Management and Medication Current Pain Management: Electronic Signature(s) Signed: 08/12/2022 3:38:57 PM By: Blanche East RN Entered By: Blanche East on 08/12/2022 12:40:47 -------------------------------------------------------------------------------- Patient/Caregiver Education Details Patient Name: Date of Service: Barnetta Hammersmith 2/23/2024andnbsp12:30 PM Medical Record Number: [id number] Patient Account Number: [id number] Date of Birth/Gender: Treating RN: 04-Feb-1946 (77 y.o. Marta Lamas Primary Care Physician: Roe Coombs Other Clinician: Referring Physician: Treating Physician/Extender: Santiago Bur in Treatment: 2 Education Assessment Education Provided To: Patient Education Topics Provided Wound Debridement: Methods: Explain/Verbal Responses: Reinforcements needed, State content correctly Wound/Skin Impairment: Methods: Explain/Verbal Responses: Reinforcements needed, State content correctly Electronic Signature(s) Signed: 08/12/2022 3:38:57 PM By: Blanche East RN Entered By: Blanche East on 08/12/2022 12:54:22 -------------------------------------------------------------------------------- Wound Assessment Details Patient  Name: Date of Service: Teryl Lucy NNA [name]. 08/12/2022 12:30 PM Medical Record Number: [id number] Patient Account Number: [id number] Date of Birth/Sex: Treating RN: 12-Sep-1945 (77 y.o. Marta Lamas Primary Care Tonia Avino: Roe Coombs Other Clinician: Referring Carolin Quang: Treating Lon Klippel/Extender: Josue Hector Weeks in Treatment: 2 Wound Status Wound Number: 1 Primary Atypical Etiology: Wound Location: Left, Medial Upper Leg THEADA, POEPPELMAN ([id number]) [phone] Page 8 of 12 Wound Open Wounding Event: Blister Status: Date Acquired: 07/25/2022 Comorbid Cataracts, Chronic sinus problems/congestion, Asthma, Weeks Of Treatment: 2 History: Hypertension, Osteoarthritis Clustered Wound: No Photos Wound Measurements Length: (cm) 0.5 Width: (cm) 0.6 Depth: (cm) 0.1 Area: (cm) 0.236 Volume: (cm) 0.024 % Reduction in Area: 66.6% % Reduction in Volume: 66.2% Epithelialization: Small (1-33%) Tunneling: No Undermining: No Wound Description Classification: Partial Thickness Wound Margin: Flat and Intact Exudate Amount: Small Exudate Type: Serous Exudate Color: amber Foul Odor After Cleansing: No Slough/Fibrino No Wound Bed Granulation Amount: Large (67-100%) Exposed Structure Granulation Quality: Pink Fascia Exposed: No Necrotic Amount: None Present (0%) Fat Layer (Subcutaneous Tissue) Exposed: No Tendon Exposed: No Muscle Exposed: No Joint Exposed: No Bone Exposed: No Limited to Skin Breakdown Periwound Skin Texture Texture Color No Abnormalities Noted: Yes No Abnormalities Noted: No Atrophie Blanche: No Moisture Cyanosis: No No Abnormalities Noted: No Ecchymosis: No Dry / Scaly: No Erythema: No Maceration: No Hemosiderin Staining: Yes Mottled: No Pallor: No Rubor: No Temperature / Pain Temperature: No Abnormality Tenderness on Palpation: Yes Treatment Notes Wound #1 (Upper Leg) Wound Laterality: Left,  Medial Cleanser Peri-Wound Care Topical Triamcinolone Discharge Instruction: Apply Triamcinolone as directed Primary Dressing Sorbalgon AG Dressing, 4x4 (in/in) Discharge Instruction: Apply to wound bed as instructed QUINTINA, SANGHVI ([id number]) [id number].pdf Page 9 of 12 Secondary Dressing Zetuvit Plus Silicone Border Dressing 4x4 (in/in) Discharge Instruction: Apply silicone border over primary dressing as directed. Secured With Compression Wrap Compression Stockings  Add-Ons Electronic Signature(s) Signed: 08/12/2022 3:38:57 PM By: Blanche East RN Entered By: Blanche East on 08/12/2022 12:48:39 -------------------------------------------------------------------------------- Wound Assessment Details Patient Name: Date of Service: Teryl Lucy NNA [name]. 08/12/2022 12:30 PM Medical Record Number: [id number] Patient Account Number: [id number] Date of Birth/Sex: Treating RN: 08-21-1945 (77 y.o. Iver Nestle, Jamie Primary Care Brenon Antosh: Roe Coombs Other Clinician: Referring Matthews Franks: Treating Amzie Sillas/Extender: Santiago Bur in Treatment: 2 Wound Status Wound Number: 2 Primary Atypical Etiology: Wound Location: Right, Medial Upper Leg Wound Open Wounding Event: Blister Status: Date Acquired: 07/25/2022 Comorbid Cataracts, Chronic sinus problems/congestion, Asthma, Weeks Of Treatment: 2 History: Hypertension, Osteoarthritis Clustered Wound: No Photos Wound Measurements Length: (cm) 1.2 Width: (cm) 0.8 Depth: (cm) 0.1 Area: (cm) 0.754 Volume: (cm) 0.075 % Reduction in Area: 73.6% % Reduction in Volume: 73.8% Epithelialization: Large (67-100%) Tunneling: No Undermining: No Wound Description Classification: Partial Thickness Wound Margin: Flat and Intact Exudate Amount: Medium Exudate Type: Serous Exudate Color: amber Foul Odor After Cleansing: No Slough/Fibrino No Wound Bed Granulation Amount: Large (67-100%) Exposed  Structure Granulation Quality: Pink Fascia Exposed: No Necrotic Amount: Small (1-33%) Fat Layer (Subcutaneous Tissue) Exposed: Yes Necrotic Quality: Eschar Tendon Exposed: No Muscle Exposed: No Joint Exposed: No Bone Exposed: No [address] KARISA, HACKBARTH [name] ([id number]) [id number] Page 10 of 12 Texture Color No Abnormalities Noted: Yes No Abnormalities Noted: No Hemosiderin Staining: Yes Moisture No Abnormalities Noted: Yes Temperature / Pain Temperature: No Abnormality Tenderness on Palpation: Yes Treatment Notes Wound #2 (Upper Leg) Wound Laterality: Right, Medial Cleanser Peri-Wound Care Topical Triamcinolone Discharge Instruction: Apply Triamcinolone as directed Primary Dressing Sorbalgon AG Dressing, 4x4 (in/in) Discharge Instruction: Apply to wound bed as instructed Secondary Dressing Zetuvit Plus Silicone Border Dressing 4x4 (in/in) Discharge Instruction: Apply silicone border over primary dressing as directed. Secured With Compression Wrap Compression Stockings Environmental education officer) Signed: 08/12/2022 3:38:57 PM By: Blanche East RN Entered By: Blanche East on 08/12/2022 12:49:00 -------------------------------------------------------------------------------- Wound Assessment Details Patient Name: Date of Service: Teryl Lucy NNA [name]. 08/12/2022 12:30 PM Medical Record Number: [id number] Patient Account Number: [id number] Date of Birth/Sex: Treating RN: 01-16-1946 (77 y.o. Iver Nestle, Jamie Primary Care Briyana Badman: Roe Coombs Other Clinician: Referring Grayson Pfefferle: Treating Chela Sutphen/Extender: Santiago Bur in Treatment: 2 Wound Status Wound Number: 3 Primary Venous Leg Ulcer Etiology: Wound Location: Left, Medial Lower Leg Wound Open Wounding Event: Blister Status: Date Acquired: 08/05/2022 Comorbid Cataracts, Chronic sinus problems/congestion, Asthma, Weeks Of Treatment: 0 History:  Hypertension, Osteoarthritis Clustered Wound: No Photos Wound Measurements Length: (cm) 2.5 AURIYAH, LUSH ([id number]) Width: (cm) 2.8 Depth: (cm) 0.1 Area: (cm) 5.498 Volume: (cm) 0.55 % Reduction in Area: [phone].pdf Page 11 of 12 % Reduction in Volume: Epithelialization: Small (1-33%) Tunneling: No Undermining: No Wound Description Classification: Full Thickness Without Exposed Support Structures Exudate Amount: Medium Exudate Type: Serous Exudate Color: amber Foul Odor After Cleansing: No Slough/Fibrino Yes Wound Bed Granulation Amount: Large (67-100%) Exposed Structure Granulation Quality: Pink Fascia Exposed: No Necrotic Amount: Small (1-33%) Fat Layer (Subcutaneous Tissue) Exposed: Yes Necrotic Quality: Eschar, Adherent Slough Tendon Exposed: No Muscle Exposed: No Joint Exposed: No Bone Exposed: No Periwound Skin Texture Texture Color No Abnormalities Noted: Yes No Abnormalities Noted: No Rubor: Yes Moisture No Abnormalities Noted: No Temperature / Pain Dry / Scaly: No Temperature: No Abnormality Maceration: No Tenderness on Palpation: Yes Treatment Notes Wound #3 (Lower Leg) Wound Laterality: Left, Medial Cleanser Peri-Wound Care Topical Triamcinolone Discharge Instruction: Apply Triamcinolone as directed Primary Dressing Sorbalgon AG Dressing, 4x4 (in/in) Discharge  Instruction: Apply to wound bed as instructed Secondary Dressing Zetuvit Plus Silicone Border Dressing 4x4 (in/in) Discharge Instruction: Apply silicone border over primary dressing as directed. Secured With Compression Wrap Compression Stockings Environmental education officer) Signed: 08/12/2022 3:38:57 PM By: Blanche East RN Entered By: Blanche East on 08/12/2022 12:49:33 -------------------------------------------------------------------------------- Vitals Details Patient Name: Date of Service: [name], DO NNA [name]. 08/12/2022 12:30 PM Medical Record  Number: [id number] Patient Account Number: [id number] Date of Birth/Sex: Treating RN: 21-Nov-1945 (77 y.o. Marta Lamas Primary Care Zabrina Brotherton: Roe Coombs Other Clinician: Referring Larren Copes: Treating Dorothea Yow/Extender: Josue Hector Weeks in Treatment: 2 Vital Signs Time Taken: 12:40 Temperature (F): 97.7 RYNEISHA, DEBSKI ([id number]) [id number] Page 12 of 12 Height (in): 60 Pulse (bpm): 76 Weight (lbs): 116.5 Respiratory Rate (breaths/min): 18 Body Mass Index (BMI): 22.7 Blood Pressure (mmHg): 178/92 Reference Range: 80 - 120 mg / dl Electronic Signature(s) Signed: 08/12/2022 3:38:57 PM By: Blanche East RN Entered By: Blanche East on 08/12/2022 12:40:41

## 2022-08-13 NOTE — Progress Notes (Signed)
CARRISSA, NEEPER [name] ([id number]) [id number] Page 1 of 7 Visit Report for 08/12/2022 Chief Complaint Document Details Patient Name: Date of Service: Cheryl [name], Cheryl [name] [place] [name]. 08/12/2022 12:30 PM Medical Record Number: [id number] Patient Account Number: [id number] Date of Birth/Sex: Treating RN: 10-27-1945 (77 y.o. F) Primary Care Provider: Roe Coombs Other Clinician: Referring Provider: Treating Provider/Extender: Santiago Bur in Treatment: 2 Information Obtained from: Patient Chief Complaint Patient seen for complaints of Non-Healing Wound. Electronic Signature(s) Signed: 08/12/2022 1:14:00 PM By: Fredirick Maudlin MD FACS Entered By: Fredirick Maudlin on 08/12/2022 13:14:00 -------------------------------------------------------------------------------- HPI Details Patient Name: Date of Service: Cheryl Knows, DO Cheryl [name]. 08/12/2022 12:30 PM Medical Record Number: [id number] Patient Account Number: [id number] Date of Birth/Sex: Treating RN: Feb 01, 1946 (77 y.o. F) Primary Care Provider: Roe Coombs Other Clinician: Referring Provider: Treating Provider/Extender: Santiago Bur in Treatment: 2 History of Present Illness HPI Description: ADMISSION 07/29/2022 This is a 77 year old woman with a past medical history notable for hypertension, hypothyroidism, and dementia. She was referred by her primary care doctor for evaluation of wounds on her bilateral legs. The family practice notes characterizes these as venous stasis ulcers, but these are actually superficial wounds on the patient's medial bilateral thighs. They appear to start as blisters and the wounds are limited to breakdown of skin. She has an intact blister on her right medial thigh. She does not have any lower leg wounds. She and her husband deny any skin picking habits. These do not appear to be consistent with urine burns from incontinence. They deny any new soaps,  detergents, or clothing that could be irritating the area. They have been applying triamcinolone and mupirocin to the wounds. She is currently taking doxycycline, although I do not see any evidence of infection. 08/12/2022: She developed a large blister on her left medial calf. This has broken and she has a new wound in this site. She has a couple of other small unbroken blisters on this leg. The other sites, that were present last visit, are smaller. All sites are clean without concern for infection or any accumulation of slough. Electronic Signature(s) Signed: 08/12/2022 1:15:30 PM By: Fredirick Maudlin MD FACS Entered By: Fredirick Maudlin on 08/12/2022 13:15:30 -------------------------------------------------------------------------------- Physical Exam Details Patient Name: Date of Service: Cheryl Lucy Cheryl [name]. 08/12/2022 12:30 PM Medical Record Number: [id number] Patient Account Number: [id number] Date of Birth/Sex: Treating RN: 04-10-1946 (77 y.o. F) Primary Care Provider: Roe Coombs Other Clinician: Referring Provider: Treating Provider/Extender: Santiago Bur in Treatment: 2 Constitutional Hypertensive, asymptomatic. . . . no acute distress. Respiratory Normal work of breathing on room air. Cheryl [name], Cheryl [name] ([id number]) [id number] Page 2 of 7 Notes 08/12/2022: She developed a large blister on her left medial calf. This has broken and she has a new wound in this site. She has a couple of other small unbroken blisters on this leg. The other sites, that were present last visit, are smaller. Electronic Signature(s) Signed: 08/12/2022 1:16:12 PM By: Fredirick Maudlin MD FACS Entered By: Fredirick Maudlin on 08/12/2022 13:16:12 -------------------------------------------------------------------------------- Physician Orders Details Patient Name: Date of Service: Cheryl Lucy Cheryl [name]. 08/12/2022 12:30 PM Medical Record Number: [id number] Patient  Account Number: [id number] Date of Birth/Sex: Treating RN: [birth year] (77 y.o. 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 - Dr. Celine Ahr Return A Bathing/ Shower/ Hygiene May shower and wash wound with soap and water. Wound Treatment Wound #1 - Upper Leg Wound Laterality: Left, Medial Topical: Triamcinolone Every Other Day/30 Days Discharge Instructions: Apply Triamcinolone as directed Prim Dressing: Sorbalgon AG Dressing, 4x4 (in/in) Every Other Day/30 Days ary Discharge Instructions: Apply to wound bed as instructed Secondary Dressing: Zetuvit Plus Silicone Border Dressing 4x4 (in/in) Every Other Day/30 Days Discharge Instructions: Apply silicone border over primary dressing as directed. Wound #2 - Upper Leg Wound Laterality: Right, Medial Topical: Triamcinolone Every Other Day/30 Days Discharge Instructions: Apply Triamcinolone as directed Prim Dressing: Sorbalgon AG Dressing, 4x4 (in/in) Every Other Day/30 Days ary Discharge Instructions: Apply to wound bed as instructed Secondary Dressing: Zetuvit Plus Silicone Border Dressing 4x4 (in/in) Every Other Day/30 Days Discharge Instructions: Apply silicone border over primary dressing as directed. Wound #3 - Lower Leg Wound Laterality: Left, Medial Topical: Triamcinolone Every Other Day/30 Days Discharge Instructions: Apply Triamcinolone as directed Prim Dressing: Sorbalgon AG  Dressing, 4x4 (in/in) Every Other Day/30 Days ary Discharge Instructions: Apply to wound bed as instructed Secondary Dressing: Zetuvit Plus Silicone Border Dressing 4x4 (in/in) Every Other Day/30 Days Discharge Instructions: Apply silicone border over primary dressing as directed. Electronic Signature(s) Signed: 08/12/2022 1:17:21 PM By: Fredirick Maudlin MD FACS Previous Signature: 08/12/2022 12:54:01 PM Version By: Blanche East RN Cheryl [name], Riley Lam ([id number]) [id number] Page 3 of 7 Previous Signature: 08/12/2022 12:54:01 PM Version By: Blanche East RN Entered By: Fredirick Maudlin on 08/12/2022 13:17:20 -------------------------------------------------------------------------------- Problem List Details Patient Name: Date of Service: Cheryl Lucy Cheryl [name]. 08/12/2022 12:30 PM Medical Record Number: [id number] Patient Account Number: [id number] Date of Birth/Sex: Treating RN: 08-11-1945 (77 y.o. 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 08/12/2022 12:30 PM Medical  Record Number: [id number] Patient Account Number: [id number] Date of Birth/Sex: Treating RN: 05-Feb-1946 (77 y.o. F) Primary Care Provider: Roe Coombs Other Clinician: Referring Provider: Treating Provider/Extender: Santiago Bur in Treatment: 2 Subjective Chief Complaint Information obtained from Patient Patient seen for complaints of Non-Healing Wound. History of Present Illness (HPI) ADMISSION 07/29/2022 This is a 77 year old woman with a past medical history notable for hypertension, hypothyroidism, and dementia. She was referred by her primary care doctor for evaluation of wounds on her bilateral legs. The family practice notes characterizes these as venous stasis ulcers, but these are actually superficial wounds on the patient's medial bilateral thighs. They appear to start as blisters and the wounds are limited to breakdown of skin. She has an intact blister on her right medial thigh. She does not have any lower leg wounds. She  and her husband deny any skin picking habits. These do not appear to be consistent with urine burns from incontinence. They deny any new soaps, detergents, or clothing that could be irritating the area. They have been applying triamcinolone and KAMARIE, ROCKER ([id number]) [phone].pdf Page 4 of 7 mupirocin to the wounds. She is currently taking doxycycline, although I do not see any evidence of infection. 08/12/2022: She developed a large blister on her left medial calf. This has broken and she has a new wound in this site. She has a couple of other small unbroken blisters on this leg. The other sites, that were present last visit, are smaller. All sites are clean without concern for infection or any accumulation of slough. Patient History Information obtained from Patient, Caregiver, Chart. Family History Heart Disease, Hypertension - Mother, No family history of Cancer, Diabetes, Hereditary Spherocytosis,  Kidney Disease, Lung Disease, Seizures, Stroke, Thyroid Problems, Tuberculosis. Social History Never smoker, Marital Status - Married, Alcohol Use - Never, Drug Use - No History, Caffeine Use - Daily. Medical History Eyes Patient has history of Cataracts - bil removed Denies history of Glaucoma, Optic Neuritis Ear/Nose/Mouth/Throat Patient has history of Chronic sinus problems/congestion Denies history of Middle ear problems Respiratory Patient has history of Asthma Cardiovascular Patient has history of Hypertension Endocrine Denies history of Type I Diabetes, Type II Diabetes Genitourinary Denies history of End Stage Renal Disease Immunological Denies history of Lupus Erythematosus, Raynaudoos, Scleroderma Integumentary (Skin) Denies history of History of Burn Musculoskeletal Patient has history of Osteoarthritis Denies history of Gout, Rheumatoid Arthritis Oncologic Denies history of Received Chemotherapy, Received Radiation Psychiatric Denies history of Anorexia/bulimia, Confinement Anxiety Medical A Surgical History Notes nd Ear/Nose/Mouth/Throat hard of hearing Cardiovascular mitral valve disorder, pulmonary hypertension Endocrine hypothyroidism Musculoskeletal osteoporesis Objective Constitutional Hypertensive, asymptomatic. no acute distress. Vitals Time Taken: 12:40 PM, Height: 60 in, Weight: 116.5 lbs, BMI: 22.7, Temperature: 97.7 F, Pulse: 76 bpm, Respiratory Rate: 18 breaths/min, Blood Pressure: 178/92 mmHg. Respiratory Normal work of breathing on room air. General Notes: 08/12/2022: She developed a large blister on her left medial calf. This has broken and she has a new wound in this site. She has a couple of other small unbroken blisters on this leg. The other sites, that were present last visit, are smaller. Integumentary (Hair, Skin) Wound #1 status is Open. Original cause of wound was Blister. The date acquired was: 07/25/2022. The wound has been in  treatment 2 weeks. The wound is located on the Left,Medial Upper Leg. The wound measures 0.5cm length x 0.6cm width x 0.1cm depth; 0.236cm^2 area and 0.024cm^3 volume. The wound is limited to skin breakdown. There is no tunneling or undermining noted. There is a small amount of serous drainage noted. The wound margin is flat and intact. There is large (67-100%) pink granulation within the wound bed. There is no necrotic tissue within the wound bed. The periwound skin appearance had no abnormalities noted for texture. The periwound skin appearance exhibited: Hemosiderin Staining. The periwound skin appearance did not exhibit: Dry/Scaly, Maceration, Atrophie Blanche, Cyanosis, Ecchymosis, Mottled, Pallor, Rubor, Erythema. Periwound temperature was noted as No Abnormality. The periwound has tenderness on palpation. Wound #2 status is Open. Original cause of wound was Blister. The date acquired was: 07/25/2022. The wound has been in treatment 2 weeks. The wound is located on the Right,Medial Upper Leg. The wound measures 1.2cm length x 0.8cm width x 0.1cm depth; 0.754cm^2 area and 0.075cm^3 volume. There is Fat Cheryl [name], Cheryl [name] ([id number]) [id number].pdf Page 5  of 7 Layer (Subcutaneous Tissue) exposed. There is no tunneling or undermining noted. There is a medium amount of serous drainage noted. The wound margin is flat and intact. There is large (67-100%) pink granulation within the wound bed. There is a small (1-33%) amount of necrotic tissue within the wound bed including Eschar. The periwound skin appearance had no abnormalities noted for texture. The periwound skin appearance had no abnormalities noted for moisture. The periwound skin appearance exhibited: Hemosiderin Staining. Periwound temperature was noted as No Abnormality. The periwound has tenderness on palpation. Wound #3 status is Open. Original cause of wound was Blister. The date acquired was: 08/05/2022. The wound is  located on the Left,Medial Lower Leg. The wound measures 2.5cm length x 2.8cm width x 0.1cm depth; 5.498cm^2 area and 0.55cm^3 volume. There is Fat Layer (Subcutaneous Tissue) exposed. There is no tunneling or undermining noted. There is a medium amount of serous drainage noted. There is large (67-100%) pink granulation within the wound bed. There is a small (1-33%) amount of necrotic tissue within the wound bed including Eschar and Adherent Slough. The periwound skin appearance had no abnormalities noted for texture. The periwound skin appearance exhibited: Rubor. The periwound skin appearance did not exhibit: Dry/Scaly, Maceration. Periwound temperature was noted as No Abnormality. The periwound has tenderness on palpation. 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 - Dr. Celine Ahr Bathing/ Shower/ Hygiene: May shower and wash wound with soap and water. WOUND #1: - Upper Leg Wound Laterality: Left, Medial Topical: Triamcinolone Every Other Day/30 Days Discharge Instructions: Apply Triamcinolone as directed Prim Dressing: Sorbalgon AG Dressing, 4x4 (in/in) Every Other Day/30 Days ary Discharge Instructions: Apply to wound bed as instructed Secondary Dressing: Zetuvit Plus Silicone Border Dressing 4x4 (in/in) Every Other Day/30 Days Discharge Instructions: Apply silicone border over primary dressing as directed. WOUND #2: - Upper Leg Wound Laterality: Right, Medial Topical: Triamcinolone Every Other Day/30 Days Discharge Instructions: Apply Triamcinolone as directed Prim Dressing: Sorbalgon AG Dressing, 4x4 (in/in) Every Other Day/30 Days ary Discharge  Instructions: Apply to wound bed as instructed Secondary Dressing: Zetuvit Plus Silicone Border Dressing 4x4 (in/in) Every Other Day/30 Days Discharge Instructions: Apply silicone border over primary dressing as directed. WOUND #3: - Lower Leg Wound Laterality: Left, Medial Topical: Triamcinolone Every Other Day/30 Days Discharge Instructions: Apply Triamcinolone as directed Prim Dressing: Sorbalgon AG Dressing, 4x4 (in/in) Every Other Day/30 Days ary Discharge Instructions: Apply to wound bed as instructed Secondary Dressing: Zetuvit Plus Silicone Border Dressing 4x4 (in/in) Every Other Day/30 Days Discharge Instructions: Apply silicone border over primary dressing as directed. 08/12/2022: She developed a large blister on her left medial calf. This has broken and she has a new wound in this site. She has a couple of other small unbroken blisters on this leg. The other sites, that were present last visit, are smaller. No wound required debridement today. We will continue silver alginate with foam border dressings to all open sites. Follow-up in 2 weeks. Electronic Signature(s) Signed: 08/12/2022 1:17:53 PM By: Fredirick Maudlin MD FACS Entered By: Fredirick Maudlin on 08/12/2022 13:17:53 -------------------------------------------------------------------------------- HxROS Details Patient Name: Date of Service: Cheryl Knows, DO Cheryl [name]. 08/12/2022 12:30 PM Medical Record Number: [id number] Patient Account Number: [id number] Date of Birth/Sex: Treating RN: 1946-04-28 (76 y.o.  F) Primary Care Provider: Roe Coombs Other Clinician: Referring Provider: Treating Provider/Extender: Santiago Bur in Treatment: 2 Cheryl [name], Cheryl [name] ([id number]) [id number] Page 6 of 7 Information Obtained From Patient Caregiver Chart Eyes Medical History: Positive for: Cataracts - bil removed Negative for: Glaucoma; Optic Neuritis Ear/Nose/Mouth/Throat Medical  History: Positive for: Chronic sinus problems/congestion Negative for: Middle ear problems Past Medical History Notes: hard of hearing Respiratory Medical History: Positive for: Asthma Cardiovascular Medical History: Positive for: Hypertension Past Medical History Notes: mitral valve disorder, pulmonary hypertension Endocrine Medical History: Negative for: Type I Diabetes; Type II Diabetes Past Medical History Notes: hypothyroidism Genitourinary Medical History: Negative for: End Stage Renal Disease Immunological Medical History: Negative for: Lupus Erythematosus; Raynauds; Scleroderma Integumentary (Skin) Medical History: Negative for: History of Burn Musculoskeletal Medical History: Positive for: Osteoarthritis Negative for: Gout; Rheumatoid Arthritis Past Medical History Notes: osteoporesis Oncologic Medical History: Negative for: Received Chemotherapy; Received Radiation Psychiatric Medical History: Negative for: Anorexia/bulimia; Confinement Anxiety HBO Extended History Items Ear/Nose/Mouth/Throat: Eyes: Chronic sinus Cataracts problems/congestion Immunizations Pneumococcal Vaccine: Received Pneumococcal Vaccination: Yes Received Pneumococcal Vaccination On or After 60th BirthdayGLORY, WANN ([id number]) [phone].pdf Page 7 of 7 Implantable Devices No devices added Family and Social History Cancer: No; Diabetes: No; Heart Disease: Yes; Hereditary Spherocytosis: No; Hypertension: Yes - Mother; Kidney Disease: No; Lung Disease: No; Seizures: No; Stroke: No; Thyroid Problems: No; Tuberculosis: No; Never smoker; Marital Status - Married; Alcohol Use: Never; Drug Use: No History; Caffeine Use: Daily; Financial Concerns: No; Food, Clothing or Shelter Needs: No; Support System Lacking: No; Transportation Concerns: No Engineer, maintenance) Signed: 08/12/2022 1:23:06 PM By: Fredirick Maudlin MD FACS Entered By: Fredirick Maudlin  on 08/12/2022 13:15:40 -------------------------------------------------------------------------------- SuperBill Details Patient Name: Date of Service: Cheryl Lucy Cheryl [name]. 08/12/2022 Medical Record Number: [id number] Patient Account Number: [id number] Date of Birth/Sex: Treating RN: [birth year] (77 y.o. 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 I10 Essential (primary) hypertension I27.20 Pulmonary hypertension, unspecified Facility Procedures : CPT4 Code: [id number] Description: 99213 - WOUND CARE VISIT-LEV 3 EST PT Modifier: 25 Quantity: 1 Physician Procedures : CPT4 Code Description Modifier [id number] - WC PHYS LEVEL 3 - EST PT ICD-10 Diagnosis Description L97.111 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

## 2022-08-26 ENCOUNTER — Ambulatory Visit (HOSPITAL_BASED_OUTPATIENT_CLINIC_OR_DEPARTMENT_OTHER): Payer: Medicare HMO | Admitting: General Surgery

## 2022-09-01 ENCOUNTER — Encounter (HOSPITAL_BASED_OUTPATIENT_CLINIC_OR_DEPARTMENT_OTHER): Payer: Medicare HMO | Attending: General Surgery | Admitting: Internal Medicine

## 2022-09-01 DIAGNOSIS — I272 Pulmonary hypertension, unspecified: Secondary | ICD-10-CM | POA: Insufficient documentation

## 2022-09-01 DIAGNOSIS — E039 Hypothyroidism, unspecified: Secondary | ICD-10-CM | POA: Insufficient documentation

## 2022-09-01 DIAGNOSIS — L97811 Non-pressure chronic ulcer of other part of right lower leg limited to breakdown of skin: Secondary | ICD-10-CM | POA: Insufficient documentation

## 2022-09-01 DIAGNOSIS — I10 Essential (primary) hypertension: Secondary | ICD-10-CM | POA: Diagnosis not present

## 2022-09-01 DIAGNOSIS — J45909 Unspecified asthma, uncomplicated: Secondary | ICD-10-CM | POA: Insufficient documentation

## 2022-09-01 DIAGNOSIS — F0390 Unspecified dementia without behavioral disturbance: Secondary | ICD-10-CM | POA: Diagnosis not present

## 2022-09-01 DIAGNOSIS — L97121 Non-pressure chronic ulcer of left thigh limited to breakdown of skin: Secondary | ICD-10-CM

## 2022-09-01 DIAGNOSIS — L97111 Non-pressure chronic ulcer of right thigh limited to breakdown of skin: Secondary | ICD-10-CM | POA: Diagnosis not present

## 2022-09-01 DIAGNOSIS — M1990 Unspecified osteoarthritis, unspecified site: Secondary | ICD-10-CM | POA: Diagnosis not present

## 2022-09-02 NOTE — Progress Notes (Signed)
ELYSSE, FRANZEL [name] ([id number]) [id number] Page 1 of 9 Visit Report for 09/01/2022 Chief Complaint Document Details Patient Name: Date of Service: Cheryl [name], Cheryl [name] [place] [name]. 09/01/2022 3:00 PM Medical Record Number: [id number] Patient Account Number: [id number] Date of Birth/Sex: Treating RN: 1945/12/26 (77 y.o. F) Primary Care Provider: Roe Coombs Other Clinician: Referring Provider: Treating Provider/Extender: Corinna Capra in Treatment: 4 Information Obtained from: Patient Chief Complaint Patient seen for complaints of Non-Healing Wound. Electronic Signature(s) Signed: 09/01/2022 3:54:47 PM By: Kalman Shan DO Entered By: Kalman Shan on 09/01/2022 15:49:44 -------------------------------------------------------------------------------- Debridement Details Patient Name: Date of Service: Cheryl Knows, DO [place] 09/01/2022 3:00 PM Medical Record Number: [id number] Patient Account Number: [id number] Date of Birth/Sex: Treating RN: 11/04/1945 (77 y.o. [name] Primary Care Provider: Roe Coombs Other Clinician: Referring Provider: Treating Provider/Extender: Corinna Capra in Treatment: 4 Debridement Performed for Assessment: Wound #3 Left,Medial Lower Leg Performed By: Physician Kalman Shan, DO Debridement Type: Debridement Severity of Tissue Pre Debridement: Fat layer exposed Level of Consciousness (Pre-procedure): Awake and Alert Pre-procedure Verification/Time Out Yes - 15:45 Taken: Start Time: 15:45 Pain Control: Lidocaine 4% T opical Solution T Area Debrided (L x W): otal 0.5 (cm) x 0.3 (cm) = 0.15 (cm) Tissue and other material debrided: Non-Viable, Other: SKIN Level: Non-Viable Tissue Debridement Description: Selective/Open Wound Instrument: Forceps, Scissors Bleeding: Minimum Hemostasis Achieved: Pressure End Time: 15:48 Procedural Pain: 0 Post Procedural Pain: 0 Response to  Treatment: Procedure was tolerated well Level of Consciousness (Post- Awake and Alert procedure): Post Debridement Measurements of Total Wound Length: (cm) 0.5 Width: (cm) 0.3 Depth: (cm) 0.1 Volume: (cm) 0.012 Character of Wound/Ulcer Post Debridement: Improved Severity of Tissue Post Debridement: Fat layer exposed Post Procedure Diagnosis Same as Pre-procedure Notes Scribed for Dr. Heber Jerome by J.Scotton Electronic Signature(s) Consuello Bossier ([id number]) [phone].pdf Page 2 of 9 Signed: 09/01/2022 3:54:47 PM By: Kalman Shan DO Signed: 09/01/2022 5:19:33 PM By: Dellie Catholic RN Entered By: Dellie Catholic on 09/01/2022 15:50:47 -------------------------------------------------------------------------------- Debridement Details Patient Name: Date of Service: Cheryl Knows, DO [place] [name]. 09/01/2022 3:00 PM Medical Record Number: [id number] Patient Account Number: [id number] Date of Birth/Sex: Treating RN: 22-Oct-1945 (77 y.o. [name] Primary Care Provider: Roe Coombs Other Clinician: Referring Provider: Treating Provider/Extender: Corinna Capra in Treatment: 4 Debridement Performed for Assessment: Wound #2 Right,Medial Upper Leg Performed By: Physician Kalman Shan, DO Debridement Type: Debridement Level of Consciousness (Pre-procedure): Awake and Alert Pre-procedure Verification/Time Out Yes - 15:45 Taken: Start Time: 15:45 Pain Control: Lidocaine 4% T opical Solution T Area Debrided (L x W): otal 3.5 (cm) x 3 (cm) = 10.5 (cm) Tissue and other material debrided: Non-Viable, Other: SKIN Level: Non-Viable Tissue Debridement Description: Selective/Open Wound Instrument: Forceps, Scissors Bleeding: Minimum Hemostasis Achieved: Pressure End Time: 15:48 Procedural Pain: 0 Post Procedural Pain: 0 Response to Treatment: Procedure was tolerated well Level of Consciousness (Post- Awake and Alert procedure): Post  Debridement Measurements of Total Wound Length: (cm) 3.5 Width: (cm) 3 Depth: (cm) 0.1 Volume: (cm) 0.825 Character of Wound/Ulcer Post Debridement: Improved Post Procedure Diagnosis Same as Pre-procedure Notes Scribed for Dr. Heber [place] by J.Scotton Electronic Signature(s) Signed: 09/01/2022 3:54:47 PM By: Kalman Shan DO Signed: 09/01/2022 5:19:33 PM By: Dellie Catholic RN Entered By: Dellie Catholic on 09/01/2022 15:52:28 -------------------------------------------------------------------------------- HPI Details Patient Name: Date of Service: Cheryl Knows, DO [name]. 09/01/2022 3:00 PM Medical Record Number: [id number] Patient Account Number: [id number] Date of Birth/Sex: Treating RN: 1946/01/01 (77 y.o. F) Primary Care Provider: Roe Coombs Other  Clinician: Referring Provider: Treating Provider/Extender: Corinna Capra in Treatment: 4 History of Present Illness HPI Description: ADMISSION 07/29/2022 This is a 77 year old woman with a past medical history notable for hypertension, hypothyroidism, and dementia. She was referred by her primary care doctor for evaluation of wounds on her bilateral legs. The family practice notes characterizes these as venous stasis ulcers, but these are actually superficial wounds on the patient's medial bilateral thighs. They appear to start as blisters and the wounds are limited to breakdown of skin. She has an intact blister on her right Cheryl [name], Cheryl [name] ([id number]) [phone].pdf Page 3 of 9 medial thigh. She does not have any lower leg wounds. She and her husband deny any skin picking habits. These do not appear to be consistent with urine burns from incontinence. They deny any new soaps, detergents, or clothing that could be irritating the area. They have been applying triamcinolone and mupirocin to the wounds. She is currently taking doxycycline, although I do not see any evidence of infection. 08/12/2022:  She developed a large blister on her left medial calf. This has broken and she has a new wound in this site. She has a couple of other small unbroken blisters on this leg. The other sites, that were present last visit, are smaller. All sites are clean without concern for infection or any accumulation of slough. 3/14; patient presents for follow-up. It appears that her previous blister sites have healed however she has developed new wounds to the inner left thigh and medial right thigh/leg. She came in with no dressings on. Electronic Signature(s) Signed: 09/01/2022 3:54:47 PM By: Kalman Shan DO Entered By: Kalman Shan on 09/01/2022 15:50:23 -------------------------------------------------------------------------------- Physical Exam Details Patient Name: Date of Service: Teryl Lucy [facility] [name]. 09/01/2022 3:00 PM Medical Record Number: [id number] Patient Account Number: [id number] Date of Birth/Sex: Treating RN: 1945/10/24 (77 y.o. F) Primary Care Provider: Roe Coombs Other Clinician: Referring Provider: Treating Provider/Extender: Corinna Capra in Treatment: 4 Constitutional respirations regular, non-labored and within target range for patient.. Cardiovascular 2+ dorsalis pedis/posterior tibialis pulses. Psychiatric pleasant and cooperative. Notes 2 wounds to the lower extremities bilaterally located to the right medial leg/thigh and left inner thigh. Postdebridement there is granulation tissue present. Electronic Signature(s) Signed: 09/01/2022 3:54:47 PM By: Kalman Shan DO Entered By: Kalman Shan on 09/01/2022 15:51:15 -------------------------------------------------------------------------------- Physician Orders Details Patient Name: Date of Service: Cheryl Knows, DO [name]. 09/01/2022 3:00 PM Medical Record Number: [id number] Patient Account Number: [id number] Date of Birth/Sex: Treating RN: 1945-10-04 (77 y.o. 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 - Dr. Celine Ahr room 2 Return A Bathing/ Shower/ Hygiene May shower and wash wound with soap and water. Wound Treatment NASHONDA, MCBRAYER ([id number]) [phone].pdf Page 4 of 9 Wound #1 - Upper Leg Wound Laterality: Left, Medial Topical: Triamcinolone Every Other Day/30 Days Discharge Instructions: Apply Triamcinolone as directed Prim Dressing: Sorbalgon AG Dressing, 4x4 (in/in) Every Other Day/30 Days ary Discharge Instructions: Apply to wound bed as instructed Secondary Dressing: Zetuvit Plus Silicone Border Dressing 4x4 (in/in) Every Other Day/30  Days Discharge Instructions: Apply silicone border over primary dressing as directed. Wound #2 - Upper Leg Wound Laterality: Right, Medial Topical: Triamcinolone Every Other Day/30 Days Discharge Instructions: Apply Triamcinolone as directed Prim Dressing: Sorbalgon AG Dressing, 4x4 (in/in) Every Other Day/30 Days ary Discharge Instructions: Apply to wound bed as instructed Secondary Dressing: Zetuvit Plus Silicone Border Dressing 4x4 (in/in) Every Other Day/30 Days Discharge Instructions: Apply silicone border over primary dressing as directed. Wound #3 - Lower Leg Wound Laterality: Left, Medial Topical: Triamcinolone Every Other Day/30 Days Discharge Instructions: Apply Triamcinolone as directed Prim Dressing: Sorbalgon AG Dressing, 4x4 (in/in)  Every Other Day/30 Days ary Discharge Instructions: Apply to wound bed as instructed Secondary Dressing: Zetuvit Plus Silicone Border Dressing 4x4 (in/in) Every Other Day/30 Days Discharge Instructions: Apply silicone border over primary dressing as directed. Electronic Signature(s) Signed: 09/01/2022 3:54:47 PM By: Kalman Shan DO Signed: 09/01/2022 5:19:33 PM By: Dellie Catholic RN Entered By: Dellie Catholic on 09/01/2022 15:53:21 -------------------------------------------------------------------------------- Problem List Details Patient Name: Date of Service: Cheryl Knows, DO [name]. 09/01/2022 3:00 PM Medical Record Number: [id number] Patient Account Number: [id number] Date of Birth/Sex: Treating RN: 12-07-1945 (77 y.o. 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 07/29/2022 No Yes L97.221 Non-pressure chronic ulcer of left calf limited to breakdown of skin 08/12/2022 No Yes L97.121 Non-pressure chronic ulcer of left thigh limited to breakdown of skin 07/29/2022 No Yes I10 Essential (primary) hypertension 07/29/2022 No Yes I27.20 Pulmonary hypertension, unspecified 07/29/2022 No Yes Cheryl [name], Cheryl [name] ([id number]) [phone].pdf Page 5 of 9 Inactive Problems Resolved Problems Electronic Signature(s) Signed: 09/01/2022 3:54:47 PM By: Kalman Shan DO Entered By: Kalman Shan on 09/01/2022 15:49:34 -------------------------------------------------------------------------------- Progress Note Details Patient Name: Date of Service: Cheryl Knows, DO [name]. 09/01/2022 3:00 PM Medical Record Number: [id number] Patient Account Number: [id number] Date of Birth/Sex: Treating RN: 11-Mar-1946 (77 y.o. F) Primary Care  Provider: Roe Coombs Other Clinician: Referring Provider: Treating Provider/Extender: Corinna Capra in Treatment: 4 Subjective Chief Complaint Information obtained from Patient Patient seen for complaints of Non-Healing Wound. History of Present Illness (HPI) ADMISSION 07/29/2022 This is a 77 year old woman with a past medical history notable for hypertension, hypothyroidism, and dementia. She was referred by her primary care doctor for evaluation of wounds on her bilateral legs. The family practice notes characterizes these as venous stasis ulcers, but these are actually superficial wounds on the patient's medial bilateral thighs. They appear to start as blisters and the wounds are limited to breakdown of skin. She has an intact blister on her right medial thigh. She does not have any lower leg wounds. She and her husband deny any skin picking habits. These do not appear to be consistent with urine burns from incontinence. They deny any new soaps, detergents, or clothing that could be irritating the area. They have been applying triamcinolone and mupirocin to the wounds. She is currently taking doxycycline, although I do not see any evidence of infection. 08/12/2022: She developed a large blister on her left medial calf. This has broken and she has a new wound in this site. She has a couple of other small unbroken blisters on this leg. The other sites, that were present last visit, are smaller. All sites are clean without concern for infection or any accumulation of slough. 3/14; patient presents for follow-up. It  appears that her previous blister sites have healed however she has developed new wounds to the inner left thigh and medial right thigh/leg. She came in with no dressings on. Patient History Information obtained from Patient, Caregiver, Chart. Family History Heart Disease, Hypertension - Mother, No family history of Cancer, Diabetes, Hereditary  Spherocytosis, Kidney Disease, Lung Disease, Seizures, Stroke, Thyroid Problems, Tuberculosis. Social History Never smoker, Marital Status - Married, Alcohol Use - Never, Drug Use - No History, Caffeine Use - Daily. Medical History Eyes Patient has history of Cataracts - bil removed Denies history of Glaucoma, Optic Neuritis Ear/Nose/Mouth/Throat Patient has history of Chronic sinus problems/congestion Denies history of Middle ear problems Respiratory Patient has history of Asthma Cardiovascular Patient has history of Hypertension Endocrine Denies history of Type I Diabetes, Type II Diabetes Genitourinary Denies history of End Stage Renal Disease Immunological Denies history of Lupus Erythematosus, Raynaudoos, Scleroderma Integumentary (Skin) Denies history of History of Burn Musculoskeletal Patient has history of Osteoarthritis Denies history of Gout, Rheumatoid Arthritis Oncologic Denies history of Received Chemotherapy, Received Radiation Psychiatric Denies history of Anorexia/bulimia, Confinement Anxiety Medical A Surgical History Notes nd Ear/Nose/Mouth/Throat Cheryl [name], Cheryl [name] ([id number]) [id number] Page 6 of 9 hard of hearing Cardiovascular mitral valve disorder, pulmonary hypertension Endocrine hypothyroidism Musculoskeletal osteoporesis Objective Constitutional respirations regular, non-labored and within target range for patient.. Vitals Time Taken: 3:24 PM, Height: 60 in, Weight: 116.5 lbs, BMI: 22.7, Temperature: 98.1 F, Pulse: 68 bpm, Respiratory Rate: 18 breaths/min, Blood Pressure: 172/86 mmHg. Cardiovascular 2+ dorsalis pedis/posterior tibialis pulses. Psychiatric pleasant and cooperative. General Notes: 2 wounds to the lower extremities bilaterally located to the right medial leg/thigh and left inner thigh. Postdebridement there is granulation tissue present. Integumentary (Hair, Skin) Wound #1 status is Open. Original  cause of wound was Blister. The date acquired was: 07/25/2022. The wound has been in treatment 4 weeks. The wound is located on the Left,Medial Upper Leg. The wound measures 0.3cm length x 0.2cm width x 0.1cm depth; 0.047cm^2 area and 0.005cm^3 volume. The wound is limited to skin breakdown. There is no tunneling or undermining noted. There is a small amount of serous drainage noted. The wound margin is flat and intact. There is large (67-100%) pink granulation within the wound bed. There is no necrotic tissue within the wound bed. The periwound skin appearance had no abnormalities noted for texture. The periwound skin appearance exhibited: Hemosiderin Staining. The periwound skin appearance did not exhibit: Dry/Scaly, Maceration, Atrophie Blanche, Cyanosis, Ecchymosis, Mottled, Pallor, Rubor, Erythema. Periwound temperature was noted as No Abnormality. The periwound has tenderness on palpation. Wound #2 status is Open. Original cause of wound was Blister. The date acquired was: 07/25/2022. The wound has been in treatment 4 weeks. The wound is located on the Right,Medial Upper Leg. The wound measures 3.5cm length x 3cm width x 0.1cm depth; 8.247cm^2 area and 0.825cm^3 volume. There is Fat Layer (Subcutaneous Tissue) exposed. There is no tunneling or undermining noted. There is a medium amount of serous drainage noted. The wound margin is flat and intact. There is large (67-100%) pink granulation within the wound bed. There is a small (1-33%) amount of necrotic tissue within the wound bed including Eschar. The periwound skin appearance had no abnormalities noted for texture. The periwound skin appearance had no abnormalities noted for moisture. The periwound skin appearance exhibited: Hemosiderin Staining. Periwound temperature was noted as No Abnormality. The periwound has tenderness on palpation. Wound #3 status is Open. Original cause of wound was Blister. The date acquired was: 08/05/2022. The  wound has  been in treatment 2 weeks. The wound is located on the Left,Medial Lower Leg. The wound measures 0.5cm length x 0.3cm width x 0.1cm depth; 0.118cm^2 area and 0.012cm^3 volume. There is Fat Layer (Subcutaneous Tissue) exposed. There is no tunneling or undermining noted. There is a medium amount of serous drainage noted. There is large (67- 100%) pink granulation within the wound bed. There is a small (1-33%) amount of necrotic tissue within the wound bed including Eschar and Adherent Slough. The periwound skin appearance had no abnormalities noted for texture. The periwound skin appearance exhibited: Rubor. The periwound skin appearance did not exhibit: Dry/Scaly, Maceration. Periwound temperature was noted as No Abnormality. The periwound has tenderness on palpation. 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 Etiology is unclear. Not sure if this is self- induced versus dermatological disorder. They are fairly superficial. Either way I debrided nonviable tissue and recommended using silver alginate as this has worked well for her other previous blister sites. Follow-up in 1 week. Procedures Wound #3 Pre-procedure diagnosis of Wound #3 is a Venous Leg Ulcer located on the Left,Medial Lower Leg .Severity of Tissue Pre Debridement is: Fat layer exposed. There was a Selective/Open Wound Non-Viable Tissue Debridement with a total area of 0.15 sq cm performed by Kalman Shan, DO. With the following Cheryl [name], Cheryl [name] ([id number]) [id number] Page 7 of 9 instrument(s): Forceps, and Scissors to remove  Non-Viable tissue/material. Material removed includes Other: SKIN after achieving pain control using Lidocaine 4% T opical Solution. No specimens were taken. A time out was conducted at 15:45, prior to the start of the procedure. A Minimum amount of bleeding was controlled with Pressure. The procedure was tolerated well with a pain level of 0 throughout and a pain level of 0 following the procedure. Post Debridement Measurements: 0.5cm length x 0.3cm width x 0.1cm depth; 0.012cm^3 volume. Character of Wound/Ulcer Post Debridement is improved. Severity of Tissue Post Debridement is: Fat layer exposed. Post procedure Diagnosis Wound #3: Same as Pre-Procedure General Notes: Scribed for Dr. Heber [place] by J.Scotton. Plan Follow-up Appointments: Return Appointment in 2 weeks. - Dr. Celine Ahr room 2 Bathing/ Shower/ Hygiene: May shower and wash wound with soap and water. WOUND #1: - Upper Leg Wound Laterality: Left, Medial Topical: Triamcinolone Every Other Day/30 Days Discharge Instructions: Apply Triamcinolone as directed Prim Dressing: Sorbalgon AG Dressing, 4x4 (in/in) Every Other Day/30 Days ary Discharge Instructions: Apply to wound bed as instructed Secondary Dressing: Zetuvit Plus Silicone Border Dressing 4x4 (in/in) Every Other Day/30 Days Discharge Instructions: Apply silicone border over primary dressing as directed. WOUND #2: - Upper Leg Wound Laterality: Right, Medial Topical: Triamcinolone Every Other Day/30 Days Discharge Instructions: Apply Triamcinolone as directed Prim Dressing: Sorbalgon AG Dressing, 4x4 (in/in) Every Other Day/30 Days ary Discharge Instructions: Apply to wound bed as instructed Secondary Dressing: Zetuvit Plus Silicone Border Dressing 4x4 (in/in) Every Other Day/30 Days Discharge Instructions: Apply silicone border over primary dressing as directed. WOUND #3: - Lower Leg Wound Laterality: Left, Medial Topical: Triamcinolone Every Other Day/30 Days Discharge  Instructions: Apply Triamcinolone as directed Prim Dressing: Sorbalgon AG Dressing, 4x4 (in/in) Every Other Day/30 Days ary Discharge Instructions: Apply to wound bed as instructed  Secondary Dressing: Zetuvit Plus Silicone Border Dressing 4x4 (in/in) Every Other Day/30 Days Discharge Instructions: Apply silicone border over primary dressing as directed. 1. In office sharp debridement 2. Silver alginate 3. Follow-up in 1 week Electronic Signature(s) Unsigned Previous Signature: 09/01/2022 3:54:47 PM Version By: Kalman Shan DO Entered By: Deon Pilling on 09/02/2022 08:16:39 -------------------------------------------------------------------------------- HxROS Details Patient Name: Date of Service: Cheryl Knows, DO [name]. 09/01/2022 3:00 PM Medical Record Number: [id number] Patient Account Number: [id number] Date of Birth/Sex: Treating RN: [birth year] (77 y.o. F) Primary Care Provider: Roe Coombs Other Clinician: Referring Provider: Treating Provider/Extender: Corinna Capra in Treatment: 4 Information Obtained From Patient Caregiver Chart Eyes Medical History: Positive for: Cataracts - bil removed Negative for: Glaucoma; Optic Neuritis Ear/Nose/Mouth/Throat Medical History: Positive for: Chronic sinus problems/congestion Negative for: Middle ear problems Past Medical History Notes: hard of hearing Cheryl [name], Cheryl [name] ([id number]) [id number] Page 8 of 9 Respiratory Medical History: Positive for: Asthma Cardiovascular Medical History: Positive for: Hypertension Past Medical History Notes: mitral valve disorder, pulmonary hypertension Endocrine Medical History: Negative for: Type I Diabetes; Type II Diabetes Past Medical History Notes: hypothyroidism Genitourinary Medical History: Negative for: End Stage Renal Disease Immunological Medical History: Negative for: Lupus Erythematosus; Raynauds; Scleroderma Integumentary  (Skin) Medical History: Negative for: History of Burn Musculoskeletal Medical History: Positive for: Osteoarthritis Negative for: Gout; Rheumatoid Arthritis Past Medical History Notes: osteoporesis Oncologic Medical History: Negative for: Received Chemotherapy; Received Radiation Psychiatric Medical History: Negative for: Anorexia/bulimia; Confinement Anxiety HBO Extended History Items Ear/Nose/Mouth/Throat: Eyes: Chronic sinus Cataracts problems/congestion Immunizations Pneumococcal Vaccine: Received Pneumococcal Vaccination: Yes Received Pneumococcal Vaccination On or After 60th Birthday: No Implantable Devices No devices added Family and Social History Cancer: No; Diabetes: No; Heart Disease: Yes; Hereditary Spherocytosis: No; Hypertension: Yes - Mother; Kidney Disease: No; Lung Disease: No; Seizures: No; Stroke: No; Thyroid Problems: No; Tuberculosis: No; Never smoker; Marital Status - Married; Alcohol Use: Never; Drug Use: No History; Caffeine Use: Daily; Financial Concerns: No; Food, Clothing or Shelter Needs: No; Support System Lacking: No; Transportation Concerns: No Electronic Signature(s) Signed: 09/01/2022 3:54:47 PM By: Kalman Shan DO Entered By: Kalman Shan on 09/01/2022 15:50:28 Consuello Bossier ([id number]) [id number] Page 9 of 9 -------------------------------------------------------------------------------- SuperBill Details Patient Name: Date of Service: Cheryl [name], Cheryl [name] [place] [name]. 09/01/2022 Medical Record Number: [id number] Patient Account Number: [id number] Date of Birth/Sex: Treating RN: 1946/04/21 (77 y.o. 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 I10 Essential (primary) hypertension I27.20 Pulmonary hypertension, unspecified Facility Procedures : CPT4 Code: [id number] Description: [phone] - DEBRIDE WOUND 1ST 20 SQ CM OR < ICD-10 Diagnosis Description 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

## 2022-09-02 NOTE — Progress Notes (Signed)
Cheryl [name] ([id number]) [id number] Page 1 of 9 Visit Report for 09/01/2022 Arrival Information Details Patient Name: Date of Service: Cheryl [name] [place] [name]. 09/01/2022 3:00 PM Medical Record Number: [id number] Patient Account Number: [id number] Date of Birth/Sex: Treating RN: [birth year] (77 y.o. [name] Primary Care Cheryl [name]: Roe Coombs Other Clinician: Referring Rajendra Spiller: Treating Cheryl [name]/Extender: Corinna Capra in Treatment: 4 Visit Information History Since Last Visit Added or deleted any medications: No Patient Arrived: [name] Any new allergies or adverse reactions: No Arrival Time: 15:10 Had a fall or experienced change in No Accompanied By: husband activities of daily living that may affect Transfer Assistance: Manual risk of falls: Patient Identification Verified: Yes Signs or symptoms of abuse/neglect since last visito No Patient Requires Transmission-Based Precautions: No Hospitalized since last visit: No Patient Has Alerts: No Implantable device outside of the clinic excluding No cellular tissue based products placed in the center since last visit: Pain Present Now: Yes Electronic Signature(s) Signed: 09/01/2022 5:19:33 PM By: Dellie Catholic RN Entered By: Dellie Catholic on 09/01/2022 15:16:59 -------------------------------------------------------------------------------- Encounter Discharge Information Details Patient Name: Date of Service: Cheryl [name], Cheryl NNA [name]. 09/01/2022 3:00 PM Medical Record Number: [id number] Patient Account Number: [id number] Date of Birth/Sex: Treating RN: [birth year] (77 y.o. [name] Primary Care Dyshaun Bonzo: Roe Coombs Other Clinician: Referring Cheryl [name]: Treating Dafney Farler/Extender: Corinna Capra in Treatment: 4 Encounter Discharge Information Items Post Procedure Vitals Discharge Condition: Stable Temperature (F): 98.1 Ambulatory Status:  Cane Pulse (bpm): 68 Discharge Destination: Home Respiratory Rate (breaths/min): 18 Transportation: Private Auto Blood Pressure (mmHg): 172/86 Accompanied By: husband Schedule Follow-up Appointment: Yes Clinical Summary of Care: Patient Declined Electronic Signature(s) Signed: 09/01/2022 5:19:33 PM By: Dellie Catholic RN Entered By: Dellie Catholic on 09/01/2022 16:48:49 -------------------------------------------------------------------------------- Lower Extremity Assessment Details Patient Name: Date of Service: Cheryl [name]. 09/01/2022 3:00 PM Medical Record Number: [id number] Patient Account Number: [id number] Date of Birth/Sex: Treating RN: [birth year] (77 y.o. [name] Primary Care Adaleigh Warf: Roe Coombs Other Clinician: Referring Jaydin Jalomo: Treating Alandria Butkiewicz/Extender: Benny Lennert Weeks in Treatment: 4 Edema Assessment Assessed: [name]: No] Patrice Paradise: No] Edema: [Left: No] [Right: No] W[LeftDAIZIE, [name] [Right: [id number] Page 2 of 9] Calf Left: Right: Point of Measurement: From Medial Instep 31.4 cm 31 cm Ankle Left: Right: Point of Measurement: From Medial Instep 19 cm 18.5 cm Vascular Assessment Pulses: Dorsalis Pedis Palpable: [Left:Yes] [Right:Yes] Electronic Signature(s) Signed: 09/01/2022 5:19:33 PM By: Dellie Catholic RN Entered By: Dellie Catholic on 09/01/2022 15:26:07 -------------------------------------------------------------------------------- Multi Wound Chart Details Patient Name: Date of Service: Cheryl [name], Cheryl NNA [name]. 09/01/2022 3:00 PM Medical Record Number: [id number] Patient Account Number: [id number] Date of Birth/Sex: Treating RN: 1946/05/27 (77 y.o. F) Primary Care Bryam Taborda: Roe Coombs Other Clinician: Referring Dianca Owensby: Treating Lyle Niblett/Extender: Corinna Capra in Treatment: 4 Vital Signs Height(in): 60 Pulse(bpm): 65 Weight(lbs): 116.5 Blood  Pressure(mmHg): 172/86 Body Mass Index(BMI): 22.7 Temperature(F): 98.1 Respiratory Rate(breaths/min): 18 [1:Photos:] Left, Medial Upper Leg Right, Medial Upper Leg Left, Medial Lower Leg Wound Location: Blister Blister Blister Wounding Event: Atypical Atypical Venous Leg Ulcer Primary Etiology: Cataracts, Chronic sinus Cataracts, Chronic sinus Cataracts, Chronic sinus Comorbid History: problems/congestion, Asthma, problems/congestion, Asthma, problems/congestion, Asthma, Hypertension, Osteoarthritis Hypertension, Osteoarthritis Hypertension, Osteoarthritis 07/25/2022 07/25/2022 08/05/2022 Date Acquired: 4 4 2  Weeks of Treatment: Open Open Open Wound Status: No No No Wound Recurrence: 0.3x0.2x0.1 3.5x3x0.1 0.5x0.3x0.1 Measurements L x W x D (cm) 0.047 8.247 0.118 A (cm) : rea 0.005 0.825 0.012 Volume (cm) : 93.40% -188.50%  97.90% % Reduction in Area: 93.00% -188.50% 97.80% % Reduction in Volume: Partial Thickness Partial Thickness Full Thickness Without Exposed Classification: Support Structures Small Medium Medium Exudate A mount: Serous Serous Serous Exudate Type: amber amber amber Exudate Color: Flat and Intact Flat and Intact N/A Wound Margin: Large (67-100%) Large (67-100%) Large (67-100%) Granulation Amount: Pink Pink Pink Granulation Quality: None Present (0%) Small (1-33%) Small (1-33%) Necrotic Amount: N/A Eschar Eschar, Adherent Slough Necrotic Tissue: Fascia: No Fat Layer (Subcutaneous Tissue): Yes Fat Layer (Subcutaneous Tissue): Yes Exposed Structures: Fat Layer (Subcutaneous Tissue): No Fascia: No Fascia: No Cheryl [name] ([id number]) [id number] Page 3 of 9 Tendon: No Tendon: No Tendon: No Muscle: No Muscle: No Muscle: No Joint: No Joint: No Joint: No Bone: No Bone: No Bone: No Limited to Skin Breakdown Small (1-33%) Large (67-100%) Small (1-33%) Epithelialization: Excoriation: No No Abnormalities Noted  No Abnormalities Noted Periwound Skin Texture: Induration: No Callus: No Crepitus: No Rash: No Scarring: No Maceration: No No Abnormalities Noted Maceration: No Periwound Skin Moisture: Dry/Scaly: No Dry/Scaly: No Hemosiderin Staining: Yes Hemosiderin Staining: Yes Rubor: Yes Periwound Skin Color: Atrophie Blanche: No Cyanosis: No Ecchymosis: No Erythema: No Mottled: No Pallor: No Rubor: No No Abnormality No Abnormality No Abnormality Temperature: Yes Yes Yes Tenderness on Palpation: Treatment Notes Electronic Signature(s) Signed: 09/01/2022 3:54:47 PM By: Kalman Shan Cheryl Entered By: Kalman Shan on 09/01/2022 15:49:38 -------------------------------------------------------------------------------- Multi-Disciplinary Care Plan Details Patient Name: Date of Service: Cheryl [name], Cheryl NNA [name]. 09/01/2022 3:00 PM Medical Record Number: [id number] Patient Account Number: [id number] Date of Birth/Sex: Treating RN: 11-21-1945 (77 y.o. [name] Primary Care Samiel Peel: Roe Coombs Other Clinician: Referring Amellia Panik: Treating Dorla Guizar/Extender: Corinna Capra in Treatment: [place] reviewed with physician Active Inactive Wound/Skin Impairment Nursing Diagnoses: Impaired tissue integrity Knowledge deficit related to ulceration/compromised skin integrity Goals: Patient/caregiver will verbalize understanding of skin care regimen Date Initiated: 07/29/2022 Target Resolution Date: 11/18/2022 Goal Status: Active Ulcer/skin breakdown will have a volume reduction of 30% by week 4 Date Initiated: 07/29/2022 Target Resolution Date: 11/18/2022 Goal Status: Active Interventions: Assess patient/caregiver ability to obtain necessary supplies Assess patient/caregiver ability to perform ulcer/skin care regimen upon admission and as needed Assess ulceration(s) every visit Provide education on ulcer and skin care Treatment  Activities: Skin care regimen initiated : 07/29/2022 Topical wound management initiated : 07/29/2022 Notes: Electronic Signature(s) Signed: 09/01/2022 5:19:33 PM By: Dellie Catholic RN Cheryl [name], Yoshie [name] ([id number]) [phone].pdf Page 4 of 9 Entered By: Dellie Catholic on 09/01/2022 16:45:38 -------------------------------------------------------------------------------- Pain Assessment Details Patient Name: Date of Service: Cheryl [name], Cheryl [name] [place] [name]. 09/01/2022 3:00 PM Medical Record Number: [id number] Patient Account Number: [id number] Date of Birth/Sex: Treating RN: 10/14/1945 (77 y.o. [name] Primary Care Genesia Caslin: Roe Coombs Other Clinician: Referring Ainsley Sanguinetti: Treating Nima Kemppainen/Extender: Corinna Capra in Treatment: 4 Active Problems Location of Pain Severity and Description of Pain Patient Has Paino No Site Locations Pain Management and Medication Current Pain Management: Electronic Signature(s) Signed: 09/01/2022 5:19:33 PM By: Dellie Catholic RN Entered By: Dellie Catholic on 09/01/2022 15:24:53 -------------------------------------------------------------------------------- Patient/Caregiver Education Details Patient Name: Date of Service: Cheryl [name] 3/14/2024andnbsp3:00 PM Medical Record Number: [id number] Patient Account Number: [id number] Date of Birth/Gender: Treating RN: 11/03/1945 (77 y.o. [name] Primary Care Physician: Roe Coombs Other Clinician: Referring Physician: Treating Physician/Extender: Corinna Capra in Treatment: 4 Education Assessment Education Provided To: Patient Education Topics Provided Wound/Skin Impairment: Methods: Explain/Verbal Responses: Return demonstration correctly Electronic Signature(s) Signed: 09/01/2022 5:19:33 PM  By: Dellie Catholic RN Entered By: Dellie Catholic on 09/01/2022 16:45:54 Cheryl [name] ([id number].pdf Page 5 of 9 -------------------------------------------------------------------------------- Wound Assessment Details Patient Name: Date of Service: Cheryl [name], Cheryl [name] [place] [name]. 09/01/2022 3:00 PM Medical Record Number: [id number] Patient Account Number: [id number] Date of Birth/Sex: Treating RN: 18-Jul-1945 (77 y.o. [name] Primary Care Dacie Mandel: Roe Coombs Other Clinician: Referring Collyn Ribas: Treating Temperence Zenor/Extender: Corinna Capra in Treatment: 4 Wound Status Wound Number: 1 Primary Atypical Etiology: Wound Location: Left, Medial Upper Leg Wound Open Wounding Event: Blister Status: Date Acquired: 07/25/2022 Comorbid Cataracts, Chronic sinus problems/congestion, Asthma, Weeks Of Treatment: 4 History: Hypertension, Osteoarthritis Clustered Wound: No Photos Wound Measurements Length: (cm) 0.3 Width: (cm) 0.2 Depth: (cm) 0.1 Area: (cm) 0.047 Volume: (cm) 0.005 % Reduction in Area: 93.4% % Reduction in Volume: 93% Epithelialization: Small (1-33%) Tunneling: No Undermining: No Wound Description Classification: Partial Thickness Wound Margin: Flat and Intact Exudate Amount: Small Exudate Type: Serous Exudate Color: amber Foul Odor After Cleansing: No Slough/Fibrino No Wound Bed Granulation Amount: Large (67-100%) Exposed Structure Granulation Quality: Pink Fascia Exposed: No Necrotic Amount: None Present (0%) Fat Layer (Subcutaneous Tissue) Exposed: No Tendon Exposed: No Muscle Exposed: No Joint Exposed: No Bone Exposed: No Limited to Skin Breakdown Periwound Skin Texture Texture Color No Abnormalities Noted: Yes No Abnormalities Noted: No Atrophie Blanche: No Moisture Cyanosis: No No Abnormalities Noted: No Ecchymosis: No Dry / Scaly: No Erythema: No Maceration: No Hemosiderin Staining: Yes Mottled: No Pallor: No Rubor: No Temperature / Pain Temperature: No Abnormality Tenderness on  PalpationSHIRLIE, Cheryl [name] ([id number]) [id number] Page 6 of 9 Treatment Notes Wound #1 (Upper Leg) Wound Laterality: Left, Medial Cleanser Peri-Wound Care Topical Triamcinolone Discharge Instruction: Apply Triamcinolone as directed Primary Dressing Sorbalgon AG Dressing, 4x4 (in/in) Discharge Instruction: Apply to wound bed as instructed Secondary Dressing Zetuvit Plus Silicone Border Dressing 4x4 (in/in) Discharge Instruction: Apply silicone border over primary dressing as directed. Secured With Compression Wrap Compression Stockings Environmental education officer) Signed: 09/01/2022 5:19:33 PM By: Dellie Catholic RN Entered By: Dellie Catholic on 09/01/2022 15:38:09 -------------------------------------------------------------------------------- Wound Assessment Details Patient Name: Date of Service: Cheryl [name]. 09/01/2022 3:00 PM Medical Record Number: [id number] Patient Account Number: [id number] Date of Birth/Sex: Treating RN: [birth year] (77 y.o. [name] Primary Care Lizzete Gough: Roe Coombs Other Clinician: Referring Ani Deoliveira: Treating Clebert Wenger/Extender: Corinna Capra in Treatment: 4 Wound Status Wound Number: 2 Primary Atypical Etiology: Wound Location: Right, Medial Upper Leg Wound Open Wounding Event: Blister Status: Date Acquired: 07/25/2022 Comorbid Cataracts, Chronic sinus problems/congestion, Asthma, Weeks Of Treatment: 4 History: Hypertension, Osteoarthritis Clustered Wound: No Photos Wound Measurements Length: (cm) 3.5 Width: (cm) 3 Depth: (cm) 0.1 Area: (cm) 8.247 Volume: (cm) 0.825 % Reduction in Area: -188.5% % Reduction in Volume: -188.5% Epithelialization: Large (67-100%) Tunneling: No Undermining: No Wound Description Classification: Partial Thickness Wound Margin: Flat and Intact Cheryl [name], Cheryl [name] ([id number]) Exudate Amount: Medium Exudate Type: Serous Exudate Color:  amber Foul Odor After Cleansing: No Slough/Fibrino No [phone].pdf Page 7 of 9 Wound Bed Granulation Amount: Large (67-100%) Exposed Structure Granulation Quality: Pink Fascia Exposed: No Necrotic Amount: Small (1-33%) Fat Layer (Subcutaneous Tissue) Exposed: Yes Necrotic Quality: Eschar Tendon Exposed: No Muscle Exposed: No Joint Exposed: No Bone Exposed: No Periwound Skin Texture Texture Color No Abnormalities Noted: Yes No Abnormalities Noted: No Hemosiderin Staining: Yes Moisture No Abnormalities Noted: Yes Temperature / Pain Temperature: No Abnormality Tenderness on Palpation: Yes Treatment Notes Wound #2 (Upper Leg) Wound Laterality: Right,  Medial Cleanser Peri-Wound Care Topical Triamcinolone Discharge Instruction: Apply Triamcinolone as directed Primary Dressing Sorbalgon AG Dressing, 4x4 (in/in) Discharge Instruction: Apply to wound bed as instructed Secondary Dressing Zetuvit Plus Silicone Border Dressing 4x4 (in/in) Discharge Instruction: Apply silicone border over primary dressing as directed. Secured With Compression Wrap Compression Stockings Environmental education officer) Signed: 09/01/2022 5:19:33 PM By: Dellie Catholic RN Entered By: Dellie Catholic on 09/01/2022 15:35:19 -------------------------------------------------------------------------------- Wound Assessment Details Patient Name: Date of Service: Cheryl [name]. 09/01/2022 3:00 PM Medical Record Number: [id number] Patient Account Number: [id number] Date of Birth/Sex: Treating RN: 24-Dec-1945 (77 y.o. [name] Primary Care Jyaire Koudelka: Roe Coombs Other Clinician: Referring Armelia Penton: Treating Phala Schraeder/Extender: Benny Lennert Weeks in Treatment: 4 Wound Status Wound Number: 3 Primary Venous Leg Ulcer Etiology: Wound Location: Left, Medial Lower Leg Wound Open Wounding Event: Blister Status: Date Acquired: 08/05/2022 Comorbid  Cataracts, Chronic sinus problems/congestion, Asthma, Weeks Of Treatment: 2 History: Hypertension, Osteoarthritis Clustered Wound: No Photos Cheryl [name], Cheryl [name] ([id number]) [phone].pdf Page 8 of 9 Wound Measurements Length: (cm) 0.5 Width: (cm) 0.3 Depth: (cm) 0.1 Area: (cm) 0.118 Volume: (cm) 0.012 % Reduction in Area: 97.9% % Reduction in Volume: 97.8% Epithelialization: Small (1-33%) Tunneling: No Undermining: No Wound Description Classification: Full Thickness Without Exposed Support Structures Exudate Amount: Medium Exudate Type: Serous Exudate Color: amber Foul Odor After Cleansing: No Slough/Fibrino Yes Wound Bed Granulation Amount: Large (67-100%) Exposed Structure Granulation Quality: Pink Fascia Exposed: No Necrotic Amount: Small (1-33%) Fat Layer (Subcutaneous Tissue) Exposed: Yes Necrotic Quality: Eschar, Adherent Slough Tendon Exposed: No Muscle Exposed: No Joint Exposed: No Bone Exposed: No Periwound Skin Texture Texture Color No Abnormalities Noted: Yes No Abnormalities Noted: No Rubor: Yes Moisture No Abnormalities Noted: No Temperature / Pain Dry / Scaly: No Temperature: No Abnormality Maceration: No Tenderness on Palpation: Yes Treatment Notes Wound #3 (Lower Leg) Wound Laterality: Left, Medial Cleanser Peri-Wound Care Topical Triamcinolone Discharge Instruction: Apply Triamcinolone as directed Primary Dressing Sorbalgon AG Dressing, 4x4 (in/in) Discharge Instruction: Apply to wound bed as instructed Secondary Dressing Zetuvit Plus Silicone Border Dressing 4x4 (in/in) Discharge Instruction: Apply silicone border over primary dressing as directed. Secured With Compression Wrap Compression Stockings Environmental education officer) Signed: 09/01/2022 5:19:33 PM By: Dellie Catholic RN Entered By: Dellie Catholic on 09/01/2022 15:37:30 Cheryl [name] ([id number]) [id number] Page 9 of  9 -------------------------------------------------------------------------------- Vitals Details Patient Name: Date of Service: [place], [place] [name]. 09/01/2022 3:00 PM Medical Record Number: [id number] Patient Account Number: [id number] Date of Birth/Sex: Treating RN: 10-Dec-1945 (77 y.o. [name] Primary Care Eduar Kumpf: Roe Coombs Other Clinician: Referring Yameli Delamater: Treating Shaneca Orne/Extender: Corinna Capra in Treatment: 4 Vital Signs Time Taken: 15:24 Temperature (F): 98.1 Height (in): 60 Pulse (bpm): 68 Weight (lbs): 116.5 Respiratory Rate (breaths/min): 18 Body Mass Index (BMI): 22.7 Blood Pressure (mmHg): 172/86 Reference Range: 80 - 120 mg / dl Electronic Signature(s) Signed: 09/01/2022 5:19:33 PM By: Dellie Catholic RN Entered By: Dellie Catholic on 09/01/2022 15:24:45

## 2022-09-13 ENCOUNTER — Encounter (HOSPITAL_BASED_OUTPATIENT_CLINIC_OR_DEPARTMENT_OTHER): Payer: Medicare HMO | Admitting: Internal Medicine

## 2022-09-13 DIAGNOSIS — L97811 Non-pressure chronic ulcer of other part of right lower leg limited to breakdown of skin: Secondary | ICD-10-CM | POA: Diagnosis not present

## 2022-09-13 NOTE — Progress Notes (Signed)
Cheryl, BALTHAZAR [name] ([id number]) [id number] Page 1 of 8 Visit Report for 09/13/2022 Chief Complaint Document Details Patient Name: Date of Service: Cheryl [name], Cheryl [name] [place] [name]. 09/13/2022 3:00 PM Medical Record Number: [id number] Patient Account Number: [id number] Date of Birth/Sex: Treating RN: 04/30/1946 (77 y.o. F) Primary Care Provider: Roe Coombs Other Clinician: Referring Provider: Treating Provider/Extender: Santiago Bur in Treatment: 6 Information Obtained from: Patient Chief Complaint Patient seen for complaints of Non-Healing Wound. Electronic Signature(s) Signed: 09/13/2022 4:27:22 PM By: Fredirick Maudlin MD FACS Entered By: Fredirick Maudlin on 09/13/2022 16:27:22 -------------------------------------------------------------------------------- Debridement Details Patient Name: Date of Service: Cheryl [name]. 09/13/2022 3:00 PM Medical Record Number: [id number] Patient Account Number: [id number] Date of Birth/Sex: Treating RN: May 24, 1946 (77 y.o. [name] Primary Care Provider: Roe Coombs Other Clinician: Referring Provider: Treating Provider/Extender: Santiago Bur in Treatment: 6 Debridement Performed for Assessment: Wound #2 Right,Medial Upper Leg Performed By: Physician Fredirick Maudlin, MD Debridement Type: Debridement Level of Consciousness (Pre-procedure): Awake and Alert Pre-procedure Verification/Time Out Yes - 15:50 Taken: Start Time: 15:50 Pain Control: Lidocaine 4% T opical Solution T Area Debrided (L x W): otal 0.3 (cm) x 0.3 (cm) = 0.09 (cm) Tissue and other material debrided: Non-Viable, [place] Level: Non-Viable Tissue Debridement Description: Selective/Open Wound Instrument: Curette Bleeding: Minimum Hemostasis Achieved: Pressure End Time: 15:51 Procedural Pain: 0 Post Procedural Pain: 0 Response to Treatment: Procedure was tolerated well Level of Consciousness  (Post- Awake and Alert procedure): Post Debridement Measurements of Total Wound Length: (cm) 0.3 Width: (cm) 0.3 Depth: (cm) 0.1 Volume: (cm) 0.007 Character of Wound/Ulcer Post Debridement: Improved Post Procedure Diagnosis Same as Pre-procedure Notes Scribed for Dr. Celine Ahr by J.Scotton Electronic Signature(s) Signed: 09/13/2022 4:30:18 PM By: Fredirick Maudlin MD FACS Signed: 09/13/2022 4:42:10 PM By: Dellie Catholic RN [name], Nicle [name] ([id number]) [id number] Page 2 of 8 Entered By: Dellie Catholic on 09/13/2022 15:54:58 -------------------------------------------------------------------------------- HPI Details Patient Name: Date of Service: [name], [name] [place] [name]. 09/13/2022 3:00 PM Medical Record Number: [id number] Patient Account Number: [id number] Date of Birth/Sex: Treating RN: August 08, 1945 (77 y.o. F) Primary Care Provider: Roe Coombs Other Clinician: Referring Provider: Treating Provider/Extender: Santiago Bur in Treatment: 6 History of Present Illness HPI Description: ADMISSION 07/29/2022 This is a 77 year old woman with a past medical history notable for hypertension, hypothyroidism, and dementia. She was referred by her primary care doctor for evaluation of wounds on her bilateral legs. The family practice notes characterizes these as venous stasis ulcers, but these are actually superficial wounds on the patient's medial bilateral thighs. They appear to start as blisters and the wounds are limited to breakdown of skin. She has an intact blister on her right medial thigh. She does not have any lower leg wounds. She and her husband deny any skin picking habits. These do not appear to be consistent with urine burns from incontinence. They deny any new soaps, detergents, or clothing that could be irritating the area. They have been applying triamcinolone and mupirocin to the wounds. She is currently taking doxycycline, although I do  not see any evidence of infection. 08/12/2022: She developed a large blister on her left medial calf. This has broken and she has a new wound in this site. She has a couple of other small unbroken blisters on this leg. The other sites, that were present last visit, are smaller. All sites are clean without concern for infection or any accumulation of slough. 3/14; patient presents for follow-up. It appears that her previous  blister sites have healed however she has developed new wounds to the inner left thigh and medial right thigh/leg. She came in with no dressings on. 09/13/2022: The old wounds have healed but she has a new wound on her right lower leg, just below the knee. There is slough on the surface. She also has a blister distal to this. Electronic Signature(s) Signed: 09/13/2022 4:28:05 PM By: Fredirick Maudlin MD FACS Entered By: Fredirick Maudlin on 09/13/2022 16:28:05 -------------------------------------------------------------------------------- Physical Exam Details Patient Name: Date of Service: Cheryl [name]. 09/13/2022 3:00 PM Medical Record Number: [id number] Patient Account Number: [id number] Date of Birth/Sex: Treating RN: Apr 14, 1946 (77 y.o. F) Primary Care Provider: Roe Coombs Other Clinician: Referring Provider: Treating Provider/Extender: Santiago Bur in Treatment: 6 Constitutional no acute distress. Respiratory Normal work of breathing on room air. Notes 09/13/2022: The old wounds have healed but she has a new wound on her right lower leg, just below the knee. There is slough on the surface. She also has a blister distal to this. Electronic Signature(s) Signed: 09/13/2022 4:28:44 PM By: Fredirick Maudlin MD FACS Entered By: Fredirick Maudlin on 09/13/2022 16:28:44 -------------------------------------------------------------------------------- Physician Orders Details Patient Name: Date of Service: Cheryl [name]. 09/13/2022 3:00  PM Medical Record Number: [id number] Patient Account Number: [id number] Date of Birth/Sex: Treating RN: 05/15/1946 (77 y.o. [name] Primary Care Provider: Roe Coombs Other Clinician: Referring Provider: Treating Provider/Extender: Josue Hector Ke[place], Riley Lam ([id number]) [id number] Page 3 of 8 Weeks in Treatment: 6 Verbal / Phone Orders: No Diagnosis Coding ICD-10 Coding Code Description L97.811 Non-pressure chronic ulcer of other part of right lower leg limited to breakdown of skin I10 Essential (primary) hypertension I27.20 Pulmonary hypertension, unspecified Follow-up Appointments ppointment in 2 weeks. - Dr. Celine Ahr Room 3 Return A Bathing/ Shower/ Hygiene May shower and wash wound with soap and water. Wound Treatment Wound #2 - Upper Leg Wound Laterality: Right, Medial Topical: Triamcinolone Every Other Day/30 Days Discharge Instructions: Apply Triamcinolone as directed Prim Dressing: Sorbalgon AG Dressing, 4x4 (in/in) Every Other Day/30 Days ary Discharge Instructions: Apply to wound bed as instructed Secondary Dressing: Zetuvit Plus Silicone Border Dressing 4x4 (in/in) Every Other Day/30 Days Discharge Instructions: Apply silicone border over primary dressing as directed. Electronic Signature(s) Signed: 09/13/2022 4:29:11 PM By: Fredirick Maudlin MD FACS Entered By: Fredirick Maudlin on 09/13/2022 16:29:11 -------------------------------------------------------------------------------- Problem List Details Patient Name: Date of Service: Cheryl [name]. 09/13/2022 3:00 PM Medical Record Number: [id number] Patient Account Number: [id number] Date of Birth/Sex: Treating RN: 12/30/1945 (77 y.o. F) Primary Care Provider: Roe Coombs Other Clinician: Referring Provider: Treating Provider/Extender: Santiago Bur in Treatment: 6 Active Problems ICD-10 Encounter Code Description Active Date  MDM Diagnosis L97.811 Non-pressure chronic ulcer of other part of right lower leg limited to breakdown 09/13/2022 No Yes of skin I10 Essential (primary) hypertension 07/29/2022 No Yes I27.20 Pulmonary hypertension, unspecified 07/29/2022 No Yes Inactive Problems Resolved Problems ICD-10 Code Description Active Date Resolved Date L97.111 Non-pressure chronic ulcer of right thigh limited to breakdown of skin 07/29/2022 07/29/2022 Consuello Bossier ([id number]) [phone].pdf Page 4 of 8 [phone] Non-pressure chronic ulcer of left calf limited to breakdown of skin 08/12/2022 08/12/2022 L97.121 Non-pressure chronic ulcer of left thigh limited to breakdown of skin 07/29/2022 07/29/2022 Electronic Signature(s) Signed: 09/13/2022 4:27:00 PM By: Fredirick Maudlin MD FACS Entered By: Fredirick Maudlin on 09/13/2022 16:27:00 -------------------------------------------------------------------------------- Progress Note Details Patient Name: Date of Service: [name], DO NNA [name]. 09/13/2022 3:00 PM Medical Record Number:  [id number] Patient Account Number: [id number] Date of Birth/Sex: Treating RN: 1946-03-31 (77 y.o. F) Primary Care Provider: Roe Coombs Other Clinician: Referring Provider: Treating Provider/Extender: Santiago Bur in Treatment: 6 Subjective Chief Complaint Information obtained from Patient Patient seen for complaints of Non-Healing Wound. History of Present Illness (HPI) ADMISSION 07/29/2022 This is a 78 year old woman with a past medical history notable for hypertension, hypothyroidism, and dementia. She was referred by her primary care doctor for evaluation of wounds on her bilateral legs. The family practice notes characterizes these as venous stasis ulcers, but these are actually superficial wounds on the patient's medial bilateral thighs. They appear to start as blisters and the wounds are limited to breakdown of skin. She has an intact blister on  her right medial thigh. She does not have any lower leg wounds. She and her husband deny any skin picking habits. These do not appear to be consistent with urine burns from incontinence. They deny any new soaps, detergents, or clothing that could be irritating the area. They have been applying triamcinolone and mupirocin to the wounds. She is currently taking doxycycline, although I do not see any evidence of infection. 08/12/2022: She developed a large blister on her left medial calf. This has broken and she has a new wound in this site. She has a couple of other small unbroken blisters on this leg. The other sites, that were present last visit, are smaller. All sites are clean without concern for infection or any accumulation of slough. 3/14; patient presents for follow-up. It appears that her previous blister sites have healed however she has developed new wounds to the inner left thigh and medial right thigh/leg. She came in with no dressings on. 09/13/2022: The old wounds have healed but she has a new wound on her right lower leg, just below the knee. There is slough on the surface. She also has a blister distal to this. Patient History Information obtained from Patient, Caregiver, Chart. Family History Heart Disease, Hypertension - Mother, No family history of Cancer, Diabetes, Hereditary Spherocytosis, Kidney Disease, Lung Disease, Seizures, Stroke, Thyroid Problems, Tuberculosis. Social History Never smoker, Marital Status - Married, Alcohol Use - Never, Drug Use - No History, Caffeine Use - Daily. Medical History Eyes Patient has history of Cataracts - bil removed Denies history of Glaucoma, Optic Neuritis Ear/Nose/Mouth/Throat Patient has history of Chronic sinus problems/congestion Denies history of Middle ear problems Respiratory Patient has history of Asthma Cardiovascular Patient has history of Hypertension Endocrine Denies history of Type I Diabetes, Type II  Diabetes Genitourinary Denies history of End Stage Renal Disease Immunological Denies history of Lupus Erythematosus, Raynaudoos, Scleroderma Integumentary (Skin) Denies history of History of Burn Musculoskeletal Patient has history of Osteoarthritis Denies history of Gout, Rheumatoid Arthritis DEMETRISE, YOUNGDAHL ([id number]) [id number] Page 5 of 8 Oncologic Denies history of Received Chemotherapy, Received Radiation Psychiatric Denies history of Anorexia/bulimia, Confinement Anxiety Medical A Surgical History Notes nd Ear/Nose/Mouth/Throat hard of hearing Cardiovascular mitral valve disorder, pulmonary hypertension Endocrine hypothyroidism Musculoskeletal osteoporesis Objective Constitutional no acute distress. Vitals Time Taken: 3:12 PM, Height: 60 in, Weight: 116.5 lbs, BMI: 22.7, Temperature: 98 F, Pulse: 99 bpm, Respiratory Rate: 18 breaths/min, Blood Pressure: 134/80 mmHg. Respiratory Normal work of breathing on room air. General Notes: 09/13/2022: The old wounds have healed but she has a new wound on her right lower leg, just below the knee. There is slough on the surface. She also has a blister distal to this. Integumentary (Hair, Skin) Wound #1 status  is Healed - Epithelialized. Original cause of wound was Blister. The date acquired was: 07/25/2022. The wound has been in treatment 6 weeks. The wound is located on the Left,Medial Upper Leg. The wound measures 0cm length x 0cm width x 0cm depth; 0cm^2 area and 0cm^3 volume. There is no tunneling or undermining noted. There is a none present amount of drainage noted. The wound margin is flat and intact. There is no granulation within the wound bed. There is no necrotic tissue within the wound bed. The periwound skin appearance had no abnormalities noted for texture. The periwound skin appearance had no abnormalities noted for color. The periwound skin appearance did not exhibit: Dry/Scaly,  Maceration. Periwound temperature was noted as No Abnormality. The periwound has tenderness on palpation. Wound #2 status is Open. Original cause of wound was Blister. The date acquired was: 07/25/2022. The wound has been in treatment 6 weeks. The wound is located on the Right,Medial Upper Leg. The wound measures 0.3cm length x 0.3cm width x 0.1cm depth; 0.071cm^2 area and 0.007cm^3 volume. There is Fat Layer (Subcutaneous Tissue) exposed. There is no tunneling or undermining noted. There is a medium amount of serous drainage noted. The wound margin is flat and intact. There is large (67-100%) pink granulation within the wound bed. There is a small (1-33%) amount of necrotic tissue within the wound bed including Eschar. The periwound skin appearance had no abnormalities noted for texture. The periwound skin appearance had no abnormalities noted for moisture. The periwound skin appearance exhibited: Hemosiderin Staining. Periwound temperature was noted as No Abnormality. The periwound has tenderness on palpation. Wound #3 status is Healed - Epithelialized. Original cause of wound was Blister. The date acquired was: 08/05/2022. The wound has been in treatment 4 weeks. The wound is located on the Left,Medial Lower Leg. The wound measures 0cm length x 0cm width x 0cm depth; 0cm^2 area and 0cm^3 volume. There is no tunneling or undermining noted. There is a medium amount of serous drainage noted. There is no granulation within the wound bed. There is no necrotic tissue within the wound bed. The periwound skin appearance had no abnormalities noted for texture. The periwound skin appearance had no abnormalities noted for color. The periwound skin appearance did not exhibit: Dry/Scaly, Maceration. Periwound temperature was noted as No Abnormality. The periwound has tenderness on palpation. Assessment Active Problems ICD-10 Non-pressure chronic ulcer of other part of right lower leg limited to breakdown of  skin Essential (primary) hypertension Pulmonary hypertension, unspecified Procedures Wound #2 Pre-procedure diagnosis of Wound #2 is an Atypical located on the Right,Medial Upper Leg . There was a Selective/Open Wound Non-Viable Tissue Debridement with a total area of 0.09 sq cm performed by Fredirick Maudlin, MD. With the following instrument(s): Curette to remove Non-Viable tissue/material. Material removed includes [facility] after achieving pain control using Lidocaine 4% Topical Solution. No specimens were taken. A time out was conducted at 15:50, prior to the start of the procedure. A Minimum amount of bleeding was controlled with Pressure. The procedure was tolerated well with a pain level of 0 throughout and a pain level of 0 following the procedure. Post Debridement Measurements: 0.3cm length x 0.3cm width x 0.1cm depth; 0.007cm^3 volume. KAYSEY, NUSBAUM [name] ([id number]) [id number] Page 6 of 8 Character of Wound/Ulcer Post Debridement is improved. Post procedure Diagnosis Wound #2: Same as Pre-Procedure General Notes: Scribed for Dr. Celine Ahr by J.Scotton. Plan Follow-up Appointments: Return Appointment in 2 weeks. - Dr. Celine Ahr Room 3 Bathing/ Shower/ Hygiene: May shower and  wash wound with soap and water. WOUND #2: - Upper Leg Wound Laterality: Right, Medial Topical: Triamcinolone Every Other Day/30 Days Discharge Instructions: Apply Triamcinolone as directed Prim Dressing: Sorbalgon AG Dressing, 4x4 (in/in) Every Other Day/30 Days ary Discharge Instructions: Apply to wound bed as instructed Secondary Dressing: Zetuvit Plus Silicone Border Dressing 4x4 (in/in) Every Other Day/30 Days Discharge Instructions: Apply silicone border over primary dressing as directed. 09/13/2022: The old wounds have healed but she has a new wound on her right lower leg, just below the knee. There is slough on the surface. She also has a blister distal to this. I used a curette to  debride slough off of the new wound. We will apply silver alginate and a foam border dressing. We will also apply the same to the blister to help it dry up. Follow-up in 2 weeks. Electronic Signature(s) Signed: 09/14/2022 9:16:27 AM By: Fredirick Maudlin MD FACS Signed: 09/14/2022 5:29:18 PM By: Deon Pilling RN, BSN Previous Signature: 09/13/2022 4:29:38 PM Version By: Fredirick Maudlin MD FACS Entered By: Deon Pilling on 09/14/2022 08:19:08 -------------------------------------------------------------------------------- HxROS Details Patient Name: Date of Service: [name], DO NNA [name]. 09/13/2022 3:00 PM Medical Record Number: [id number] Patient Account Number: [id number] Date of Birth/Sex: Treating RN: [birth year] (77 y.o. F) Primary Care Provider: Roe Coombs Other Clinician: Referring Provider: Treating Provider/Extender: Santiago Bur in Treatment: 6 Information Obtained From Patient Caregiver Chart Eyes Medical History: Positive for: Cataracts - bil removed Negative for: Glaucoma; Optic Neuritis Ear/Nose/Mouth/Throat Medical History: Positive for: Chronic sinus problems/congestion Negative for: Middle ear problems Past Medical History Notes: hard of hearing Respiratory Medical History: Positive for: Asthma Cardiovascular Medical History: Positive for: Hypertension Past Medical History Notes: mitral valve disorder, pulmonary hypertension BERENDA, GARRY ([id number]) [id number] Page 7 of 8 Endocrine Medical History: Negative for: Type I Diabetes; Type II Diabetes Past Medical History Notes: hypothyroidism Genitourinary Medical History: Negative for: End Stage Renal Disease Immunological Medical History: Negative for: Lupus Erythematosus; Raynauds; Scleroderma Integumentary (Skin) Medical History: Negative for: History of Burn Musculoskeletal Medical History: Positive for: Osteoarthritis Negative for: Gout;  Rheumatoid Arthritis Past Medical History Notes: osteoporesis Oncologic Medical History: Negative for: Received Chemotherapy; Received Radiation Psychiatric Medical History: Negative for: Anorexia/bulimia; Confinement Anxiety HBO Extended History Items Ear/Nose/Mouth/Throat: Eyes: Chronic sinus Cataracts problems/congestion Immunizations Pneumococcal Vaccine: Received Pneumococcal Vaccination: Yes Received Pneumococcal Vaccination On or After 60th Birthday: No Implantable Devices No devices added Family and Social History Cancer: No; Diabetes: No; Heart Disease: Yes; Hereditary Spherocytosis: No; Hypertension: Yes - Mother; Kidney Disease: No; Lung Disease: No; Seizures: No; Stroke: No; Thyroid Problems: No; Tuberculosis: No; Never smoker; Marital Status - Married; Alcohol Use: Never; Drug Use: No History; Caffeine Use: Daily; Financial Concerns: No; Food, Clothing or Shelter Needs: No; Support System Lacking: No; Transportation Concerns: No Engineer, maintenance) Signed: 09/13/2022 4:30:18 PM By: Fredirick Maudlin MD FACS Entered By: Fredirick Maudlin on 09/13/2022 16:28:20 -------------------------------------------------------------------------------- SuperBill Details Patient Name: Date of Service: Cheryl [name]. 09/13/2022 Medical Record Number: [id number] Patient Account Number: [id number] Date of Birth/Sex: Treating RN: 08-09-1945 (77 y.o. F) Primary Care Provider: Roe Coombs Other Clinician: Referring Provider: Treating Provider/Extender: Santiago Bur in Treatment: 6 Diagnosis Coding AMIELLE, PATIENT ([id number]) [id number] Page 8 of 8 ICD-10 Codes Code Description L97.811 Non-pressure chronic ulcer of other part of right lower leg limited to breakdown of skin I10 Essential (primary) hypertension I27.20 Pulmonary hypertension, unspecified Facility Procedures : CPT4 Code: [id number] Description: [id number] - DEBRIDE  WOUND 1ST 20 SQ CM  OR < ICD-10 Diagnosis Description L97.811 Non-pressure chronic ulcer of other part of right lower leg limited to breakdown Modifier: of skin Quantity: 1 Physician Procedures : CPT4 Code Description Modifier [id number] - WC PHYS LEVEL 4 - EST PT 25 ICD-10 Diagnosis Description L97.811 Non-pressure chronic ulcer of other part of right lower leg limited to breakdown of skin I10 Essential (primary) hypertension I27.20  Pulmonary hypertension, unspecified Quantity: 1 : [id number] - WC PHYS DEBR WO ANESTH 20 SQ CM ICD-10 Diagnosis Description L97.811 Non-pressure chronic ulcer of other part of right lower leg limited to breakdown of skin Quantity: 1 Electronic Signature(s) Signed: 09/13/2022 4:29:55 PM By: Fredirick Maudlin MD FACS Entered By: Fredirick Maudlin on 09/13/2022 16:29:54

## 2022-09-13 NOTE — Progress Notes (Signed)
Cheryl [name], Cheryl [name] ([id number]) [id number] Page 1 of 8 Visit Report for 09/13/2022 Arrival Information Details Patient Name: Date of Service: Cheryl [name], Cheryl [name] Cheryl [name]. 09/13/2022 3:00 PM Medical Record Number: [id number] Patient Account Number: [id number] Date of Birth/Sex: Treating RN: [name] (77 y.o. Cheryl [name] Primary Care Cheryl [name]: Cheryl [name] Other Clinician: Referring Cheryl [name]: Treating Cheryl [name]/Extender: Cheryl [name] in Treatment: 6 Visit Information History Since Last Visit Added or deleted any medications: No Patient Arrived: Cheryl [name] Any new allergies or adverse reactions: No Arrival Time: 15:10 Had a fall or experienced change in No Accompanied By: spouse activities of daily living that may affect Transfer Assistance: Manual risk of falls: Patient Identification Verified: Yes Signs or symptoms of abuse/neglect since last visito No Patient Requires Transmission-Based Precautions: No Hospitalized since last visit: No Patient Has Alerts: No Implantable device outside of the clinic excluding No cellular tissue based products placed in the center since last visit: Has Dressing in Place as Prescribed: Yes Pain Present Now: Yes Electronic Signature(s) Signed: 09/13/2022 4:42:10 PM By: Cheryl Catholic RN Entered By: Cheryl [name] on 09/13/2022 15:10:47 -------------------------------------------------------------------------------- Encounter Discharge Information Details Patient Name: Date of Service: Cheryl [name]. 09/13/2022 3:00 PM Medical Record Number: [id number] Patient Account Number: [id number] Date of Birth/Sex: Treating RN: [birth year] (77 y.o. Cheryl [name] Primary Care Pietrina Jagodzinski: Cheryl [name] Other Clinician: Referring Arantza Darrington: Treating Bionca Mckey/Extender: Cheryl [name] in Treatment: 6 Encounter Discharge Information Items Post Procedure Vitals Discharge Condition:  Stable Temperature (F): 98 Ambulatory Status: Cane Pulse (bpm): 99 Discharge Destination: Home Respiratory Rate (breaths/min): 18 Transportation: Private Auto Blood Pressure (mmHg): 134/80 Accompanied By: spouse Schedule Follow-up Appointment: Yes Clinical Summary of Care: Patient Declined Electronic Signature(s) Signed: 09/13/2022 4:42:10 PM By: Cheryl Catholic RN Entered By: Cheryl [name] on 09/13/2022 16:37:50 -------------------------------------------------------------------------------- Lower Extremity Assessment Details Patient Name: Date of Service: Cheryl [name]. 09/13/2022 3:00 PM Medical Record Number: [id number] Patient Account Number: [id number] Date of Birth/Sex: Treating RN: [name] (77 y.o. Cheryl [name] Primary Care Minka Knight: Cheryl [name] Other Clinician: Referring Katasha Riga: Treating Dulcie Gammon/Extender: Josue Hector Weeks in Treatment: 6 Edema Assessment Assessed: [name]: No] Patrice Paradise: No] W[LeftEMILYGRACE, SALVAGE [id number] [Right: [id number] Page 2 of 8] Edema: [Left: No] [Right: No] Calf Left: Right: Point of Measurement: From Medial Instep 31.4 cm 31 cm Ankle Left: Right: Point of Measurement: From Medial Instep 19 cm 18.5 cm Vascular Assessment Pulses: Dorsalis Pedis Palpable: [Left:Yes] [Right:Yes] Electronic Signature(s) Signed: 09/13/2022 4:42:10 PM By: Cheryl Catholic RN Entered By: Cheryl [name] on 09/13/2022 15:14:06 -------------------------------------------------------------------------------- Multi Wound Chart Details Patient Name: Date of Service: Cheryl [name]. 09/13/2022 3:00 PM Medical Record Number: [id number] Patient Account Number: [id number] Date of Birth/Sex: Treating RN: [birth year] (77 y.o. F) Primary Care Necola Bluestein: Cheryl [name] Other Clinician: Referring Ainslee Sou: Treating Barney Russomanno/Extender: Cheryl [name] in Treatment: 6 Vital Signs Height(in):  60 Pulse(bpm): Weight(lbs): 116.5 Blood Pressure(mmHg): Body Mass Index(BMI): 22.7 Temperature(F): Respiratory Rate(breaths/min): 18 [1:Photos:] Left, Medial Upper Leg Right, Medial Upper Leg Left, Medial Lower Leg Wound Location: Blister Blister Blister Wounding Event: Atypical Atypical Venous Leg Ulcer Primary Etiology: Cataracts, Chronic sinus Cataracts, Chronic sinus Cataracts, Chronic sinus Comorbid History: problems/congestion, Asthma, problems/congestion, Asthma, problems/congestion, Asthma, Hypertension, Osteoarthritis Hypertension, Osteoarthritis Hypertension, Osteoarthritis 07/25/2022 07/25/2022 08/05/2022 Date Acquired: 6 6 4  Weeks of Treatment: Healed - Epithelialized Open Healed - Epithelialized Wound Status: No No No Wound Recurrence: 0x0x0 0.3x0.3x0.1 0x0x0 Measurements L x W x D (cm) 0 0.071 0 A (cm) : rea  0 0.007 0 Volume (cm) : 100.00% 97.50% 100.00% % Reduction in Area: 100.00% 97.60% 100.00% % Reduction in Volume: Partial Thickness Partial Thickness Full Thickness Without Exposed Classification: Support Structures None Present Medium Medium Exudate A mount: N/A Serous Serous Exudate Type: N/A amber amber Exudate Color: Flat and Intact Flat and Intact N/A Wound Margin: None Present (0%) Large (67-100%) None Present (0%) Granulation Amount: N/A Pink N/A Granulation Quality: None Present (0%) Small (1-33%) None Present (0%) Necrotic Amount: N/A Eschar N/A Necrotic Tissue: [name], [name] ([id number]) [id number] Page 3 of 8 Fascia: No Fat Layer (Subcutaneous Tissue): Yes Fascia: No Exposed Structures: Fat Layer (Subcutaneous Tissue): No Fascia: No Fat Layer (Subcutaneous Tissue): No Tendon: No Tendon: No Tendon: No Muscle: No Muscle: No Muscle: No Joint: No Joint: No Joint: No Bone: No Bone: No Bone: No Large (67-100%) Large (67-100%) Large (67-100%) Epithelialization: N/A Debridement - Selective/Open  Wound N/A Debridement: Pre-procedure Verification/Time Out N/A 15:50 N/A Taken: N/A Lidocaine 4% Topical Solution N/A Pain Control: N/A Slough N/A Tissue Debrided: N/A Non-Viable Tissue N/A Level: N/A 0.09 N/A Debridement A (sq cm): rea N/A Curette N/A Instrument: N/A Minimum N/A Bleeding: N/A Pressure N/A Hemostasis A chieved: N/A 0 N/A Procedural Pain: N/A 0 N/A Post Procedural Pain: N/A Procedure was tolerated well N/A Debridement Treatment Response: N/A 0.3x0.3x0.1 N/A Post Debridement Measurements L x W x D (cm) N/A 0.007 N/A Post Debridement Volume: (cm) Excoriation: No No Abnormalities Noted No Abnormalities Noted Periwound Skin Texture: Induration: No Callus: No Crepitus: No Rash: No Scarring: No Maceration: No No Abnormalities Noted Maceration: No Periwound Skin Moisture: Dry/Scaly: No Dry/Scaly: No Hemosiderin Staining: Yes Hemosiderin Staining: Yes Rubor: Yes Periwound Skin Color: Atrophie Blanche: No Cyanosis: No Ecchymosis: No Erythema: No Mottled: No Pallor: No Rubor: No No Abnormality No Abnormality No Abnormality Temperature: Yes Yes Yes Tenderness on Palpation: N/A Debridement N/A Procedures Performed: Treatment Notes Electronic Signature(s) Signed: 09/13/2022 4:27:16 PM By: Fredirick Maudlin MD FACS Entered By: Fredirick Maudlin on 09/13/2022 16:27:16 -------------------------------------------------------------------------------- Multi-Disciplinary Care Plan Details Patient Name: Date of Service: Cheryl Knows, Cheryl NNA [name]. 09/13/2022 3:00 PM Medical Record Number: [id number] Patient Account Number: [id number] Date of Birth/Sex: Treating RN: [name] (77 y.o. Cheryl [name] Primary Care Shametra Cumberland: Cheryl [name] Other Clinician: Referring Cayleb Jarnigan: Treating Evana Runnels/Extender: Cheryl [name] in Treatment: [place] reviewed with physician Active Inactive Wound/Skin Impairment Nursing  Diagnoses: Impaired tissue integrity Knowledge deficit related to ulceration/compromised skin integrity Goals: Patient/caregiver will verbalize understanding of skin care regimen Date Initiated: 07/29/2022 Target Resolution Date: 11/18/2022 Goal Status: Active Ulcer/skin breakdown will have a volume reduction of 30% by week 4 Date Initiated: 07/29/2022 Target Resolution Date: 11/18/2022 Goal Status: Active STACHA, SCHRANZ ([id number]) [id number].pdf Page 4 of 8 Interventions: Assess patient/caregiver ability to obtain necessary supplies Assess patient/caregiver ability to perform ulcer/skin care regimen upon admission and as needed Assess ulceration(s) every visit Provide education on ulcer and skin care Treatment Activities: Skin care regimen initiated : 07/29/2022 Topical wound management initiated : 07/29/2022 Notes: Electronic Signature(s) Signed: 09/13/2022 4:42:10 PM By: Cheryl Catholic RN Entered By: Cheryl [name] on 09/13/2022 16:33:23 -------------------------------------------------------------------------------- Pain Assessment Details Patient Name: Date of Service: Cheryl [name]. 09/13/2022 3:00 PM Medical Record Number: [id number] Patient Account Number: [id number] Date of Birth/Sex: Treating RN: [name][birth year] (77 y.o. Cheryl [name] Primary Care Ignacia Gentzler: Cheryl [name] Other Clinician: Referring Hugo Lybrand: Treating Kallen Delatorre/Extender: Cheryl [name] in Treatment: 6 Active Problems Location of Pain Severity and Description of Pain Patient  Has Paino Yes Site Locations Pain Location: Generalized Pain With Dressing Change: Yes Duration of the Pain. Constant / Intermittento Constant Rate the pain. Current Pain Level: 4 Worst Pain Level: 10 Least Pain Level: 4 Character of Pain Describe the Pain: Difficult to Pinpoint Pain Management and Medication Current Pain Management: Medication: Yes Cold Application: No Rest:  Yes Massage: No Activity: No T.CampbellN.S.: No Heat Application: No Leg drop or elevation: No Is the Current Pain Management Adequate: Adequate How does your wound impact your activities of daily livingo Sleep: No Bathing: No Appetite: No Relationship With Others: No Bladder Continence: No Emotions: No Bowel Continence: No Work: No Toileting: No Drive: No Dressing: No Hobbies: No Electronic Signature(s) Signed: 09/13/2022 4:42:10 PM By: Virgina Organ, DONNA3/26/2024 4:42:10 PM By: Cheryl Catholic RN Signed: E ([id number]) [id number].pdf Page 5 of 8 Entered By: Cheryl [name] on 09/13/2022 15:13:59 -------------------------------------------------------------------------------- Patient/Caregiver Education Details Patient Name: Date of Service: MUSIQ, BARAL 3/26/2024andnbsp3:00 PM Medical Record Number: [id number] Patient Account Number: [id number] Date of Birth/Gender: Treating RN: [name] (77 y.o. Cheryl [name] Primary Care Physician: Cheryl [name] Other Clinician: Referring Physician: Treating Physician/Extender: Cheryl [name] in Treatment: 6 Education Assessment Education Provided To: Patient Education Topics Provided Wound/Skin Impairment: Methods: Explain/Verbal Responses: Return demonstration correctly Electronic Signature(s) Signed: 09/13/2022 4:42:10 PM By: Cheryl Catholic RN Entered By: Cheryl [name] on 09/13/2022 16:33:35 -------------------------------------------------------------------------------- Wound Assessment Details Patient Name: Date of Service: Cheryl [name]. 09/13/2022 3:00 PM Medical Record Number: [id number] Patient Account Number: [id number] Date of Birth/Sex: Treating RN: [name] (77 y.o. Cheryl [name] Primary Care Roque Schill: Cheryl [name] Other Clinician: Referring Kenzington Mielke: Treating Bethanny Toelle/Extender: Cheryl [name] in Treatment: 6 Wound  Status Wound Number: 1 Primary Atypical Etiology: Wound Location: Left, Medial Upper Leg Wound Healed - Epithelialized Wounding Event: Blister Status: Date Acquired: 07/25/2022 Comorbid Cataracts, Chronic sinus problems/congestion, Asthma, Weeks Of Treatment: 6 History: Hypertension, Osteoarthritis Clustered Wound: No Photos Wound Measurements Length: (cm) Width: (cm) Depth: (cm) Area: (cm) Volume: (cm) 0 % Reduction in Area: 100% 0 % Reduction in Volume: 100% 0 Epithelialization: Large (67-100%) 0 Tunneling: No 0 Undermining: No Wound Description Cheryl [name], Cheryl [name] ([id number]) Classification: Partial Thickness Wound Margin: Flat and Intact Exudate Amount: None Present [id number] Page 6 of 8 Foul Odor After Cleansing: No Slough/Fibrino No Wound Bed Granulation Amount: None Present (0%) Exposed Structure Necrotic Amount: None Present (0%) Fascia Exposed: No Fat Layer (Subcutaneous Tissue) Exposed: No Tendon Exposed: No Muscle Exposed: No Joint Exposed: No Bone Exposed: No Periwound Skin Texture Texture Color No Abnormalities Noted: Yes No Abnormalities Noted: Yes Moisture Temperature / Pain No Abnormalities Noted: No Temperature: No Abnormality Dry / Scaly: No Tenderness on Palpation: Yes Maceration: No Electronic Signature(s) Signed: 09/13/2022 4:42:10 PM By: Cheryl Catholic RN Entered By: Cheryl [name] on 09/13/2022 15:55:50 -------------------------------------------------------------------------------- Wound Assessment Details Patient Name: Date of Service: Cheryl [name]. 09/13/2022 3:00 PM Medical Record Number: [id number] Patient Account Number: [id number] Date of Birth/Sex: Treating RN: [name] (77 y.o. Cheryl [name] Primary Care Sayed Apostol: Cheryl [name] Other Clinician: Referring Harveen Flesch: Treating Nayef College/Extender: Cheryl [name] in Treatment: 6 Wound Status Wound Number: 2 Primary  Atypical Etiology: Wound Location: Right, Medial Upper Leg Wound Open Wounding Event: Blister Status: Date Acquired: 07/25/2022 Comorbid Cataracts, Chronic sinus problems/congestion, Asthma, Weeks Of Treatment: 6 History: Hypertension, Osteoarthritis Clustered Wound: No Photos Wound Measurements Length: (cm) 0.3 Width: (cm) 0.3 Depth: (cm) 0.1 Area: (cm) 0.071 Volume: (cm) 0.007 % Reduction  in Area: 97.5% % Reduction in Volume: 97.6% Epithelialization: Large (67-100%) Tunneling: No Undermining: No Wound Description Classification: Partial Thickness Wound Margin: Flat and Intact Exudate Amount: Medium Exudate Type: Serous Exudate Color: amber Cheryl [name], Cheryl [name] ([id number]) Foul Odor After Cleansing: No Slough/Fibrino No [id number] Page 7 of 8 Wound Bed Granulation Amount: Large (67-100%) Exposed Structure Granulation Quality: Pink Fascia Exposed: No Necrotic Amount: Small (1-33%) Fat Layer (Subcutaneous Tissue) Exposed: Yes Necrotic Quality: Eschar Tendon Exposed: No Muscle Exposed: No Joint Exposed: No Bone Exposed: No Periwound Skin Texture Texture Color No Abnormalities Noted: Yes No Abnormalities Noted: No Hemosiderin Staining: Yes Moisture No Abnormalities Noted: Yes Temperature / Pain Temperature: No Abnormality Tenderness on Palpation: Yes Treatment Notes Wound #2 (Upper Leg) Wound Laterality: Right, Medial Cleanser Peri-Wound Care Topical Triamcinolone Discharge Instruction: Apply Triamcinolone as directed Primary Dressing Sorbalgon AG Dressing, 4x4 (in/in) Discharge Instruction: Apply to wound bed as instructed Secondary Dressing Zetuvit Plus Silicone Border Dressing 4x4 (in/in) Discharge Instruction: Apply silicone border over primary dressing as directed. Secured With Compression Wrap Compression Stockings Environmental education officer) Signed: 09/13/2022 4:42:10 PM By: Cheryl Catholic RN Entered By: Cheryl [name] on 09/13/2022 15:21:48 -------------------------------------------------------------------------------- Wound Assessment Details Patient Name: Date of Service: Cheryl [name]. 09/13/2022 3:00 PM Medical Record Number: [id number] Patient Account Number: [id number] Date of Birth/Sex: Treating RN: [name][birth year] (77 y.o. Cheryl [name] Primary Care Shaia Porath: Cheryl [name] Other Clinician: Referring Taryll Reichenberger: Treating Kadelyn Dimascio/Extender: Cheryl [name] in Treatment: 6 Wound Status Wound Number: 3 Primary Venous Leg Ulcer Etiology: Wound Location: Left, Medial Lower Leg Wound Healed - Epithelialized Wounding Event: Blister Status: Date Acquired: 08/05/2022 Comorbid Cataracts, Chronic sinus problems/congestion, Asthma, Weeks Of Treatment: 4 History: Hypertension, Osteoarthritis Clustered Wound: No Photos Cheryl [name], Cheryl [name] ([id number]) [id number] Page 8 of 8 Wound Measurements Length: (cm) Width: (cm) Depth: (cm) Area: (cm) Volume: (cm) 0 % Reduction in Area: 100% 0 % Reduction in Volume: 100% 0 Epithelialization: Large (67-100%) 0 Tunneling: No 0 Undermining: No Wound Description Classification: Full Thickness Without Exposed Support Structures Exudate Amount: Medium Exudate Type: Serous Exudate Color: amber Foul Odor After Cleansing: No Slough/Fibrino No Wound Bed Granulation Amount: None Present (0%) Exposed Structure Necrotic Amount: None Present (0%) Fascia Exposed: No Fat Layer (Subcutaneous Tissue) Exposed: No Tendon Exposed: No Muscle Exposed: No Joint Exposed: No Bone Exposed: No Periwound Skin Texture Texture Color No Abnormalities Noted: Yes No Abnormalities Noted: Yes Moisture Temperature / Pain No Abnormalities Noted: No Temperature: No Abnormality Dry / Scaly: No Tenderness on Palpation: Yes Maceration: No Electronic Signature(s) Signed: 09/13/2022 4:42:10 PM By: Cheryl Catholic RN Entered By:  Cheryl [name] on 09/13/2022 15:57:35 -------------------------------------------------------------------------------- Vitals Details Patient Name: Date of Service: Cheryl Knows, Cheryl NNA [name]. 09/13/2022 3:00 PM Medical Record Number: [id number] Patient Account Number: [id number] Date of Birth/Sex: Treating RN: [name][birth year] (77 y.o. Cheryl [name] Primary Care Legna Mausolf: Cheryl [name] Other Clinician: Referring Korver Graybeal: Treating Lunna Vogelgesang/Extender: Cheryl [name] in Treatment: 6 Vital Signs Time Taken: 15:12 Temperature (F): 98 Height (in): 60 Pulse (bpm): 99 Weight (lbs): 116.5 Respiratory Rate (breaths/min): 18 Body Mass Index (BMI): 22.7 Blood Pressure (mmHg): 134/80 Reference Range: 80 - 120 mg / dl Electronic Signature(s) Signed: 09/13/2022 4:42:10 PM By: Cheryl Catholic RN Entered By: Cheryl [name] on 09/13/2022 16:31:43

## 2022-09-27 ENCOUNTER — Encounter (HOSPITAL_BASED_OUTPATIENT_CLINIC_OR_DEPARTMENT_OTHER): Payer: Medicare HMO | Attending: Internal Medicine | Admitting: General Surgery

## 2022-09-27 DIAGNOSIS — S70322A Blister (nonthermal), left thigh, initial encounter: Secondary | ICD-10-CM | POA: Insufficient documentation

## 2022-09-27 DIAGNOSIS — I10 Essential (primary) hypertension: Secondary | ICD-10-CM | POA: Diagnosis not present

## 2022-09-27 DIAGNOSIS — I272 Pulmonary hypertension, unspecified: Secondary | ICD-10-CM | POA: Diagnosis not present

## 2022-09-27 DIAGNOSIS — L97121 Non-pressure chronic ulcer of left thigh limited to breakdown of skin: Secondary | ICD-10-CM | POA: Diagnosis not present

## 2022-09-27 DIAGNOSIS — L97111 Non-pressure chronic ulcer of right thigh limited to breakdown of skin: Secondary | ICD-10-CM | POA: Diagnosis not present

## 2022-09-27 DIAGNOSIS — X58XXXA Exposure to other specified factors, initial encounter: Secondary | ICD-10-CM | POA: Diagnosis not present

## 2022-09-27 DIAGNOSIS — S70321A Blister (nonthermal), right thigh, initial encounter: Secondary | ICD-10-CM | POA: Diagnosis not present

## 2022-09-27 NOTE — Progress Notes (Signed)
Cheryl, [name] ([id number]) [id number] Page 1 of 7 Visit Report for 09/27/2022 Chief Complaint Document Details Patient Name: Date of Service: Cheryl [name], Cheryl [place] [name]. 09/27/2022 2:15 PM Medical Record Number: [id number] Patient Account Number: [id number] Date of Birth/Sex: Treating RN: 12/19/1945 (77 y.o. F) Primary Care Provider: Georgette Shell Other Clinician: Referring Provider: Treating Provider/Extender: Cassell Clement in Treatment: 8 Information Obtained from: Patient Chief Complaint Patient seen for complaints of Non-Healing Wound. Electronic Signature(s) Signed: 09/27/2022 2:48:24 PM By: Duanne Guess MD FACS Entered By: Duanne Guess on 09/27/2022 14:48:23 -------------------------------------------------------------------------------- HPI Details Patient Name: Date of Service: Cheryl Mayo, DO NNA [name]. 09/27/2022 2:15 PM Medical Record Number: [id number] Patient Account Number: [id number] Date of Birth/Sex: Treating RN: 1945/09/20 (77 y.o. F) Primary Care Provider: Georgette Shell Other Clinician: Referring Provider: Treating Provider/Extender: Cassell Clement in Treatment: 8 History of Present Illness HPI Description: ADMISSION 07/29/2022 This is a 77 year old woman with a past medical history notable for hypertension, hypothyroidism, and dementia. She was referred by her primary care doctor for evaluation of wounds on her bilateral legs. The family practice notes characterizes these as venous stasis ulcers, but these are actually superficial wounds on the patient's medial bilateral thighs. They appear to start as blisters and the wounds are limited to breakdown of skin. She has an intact blister on her right medial thigh. She does not have any lower leg wounds. She and her husband deny any skin picking habits. These do not appear to be consistent with urine burns from incontinence. They deny any new soaps, detergents, or  clothing that could be irritating the area. They have been applying triamcinolone and mupirocin to the wounds. She is currently taking doxycycline, although I do not see any evidence of infection. 08/12/2022: She developed a large blister on her left medial calf. This has broken and she has a new wound in this site. She has a couple of other small unbroken blisters on this leg. The other sites, that were present last visit, are smaller. All sites are clean without concern for infection or any accumulation of slough. 3/14; patient presents for follow-up. It appears that her previous blister sites have healed however she has developed new wounds to the inner left thigh and medial right thigh/leg. She came in with no dressings on. 09/13/2022: The old wounds have healed but she has a new wound on her right lower leg, just below the [name]. There is slough on the surface. She also has a blister distal to this. 09/27/2022: Her right leg has healed. She has a new blister on her left medial thigh. No concern for infection. She came in again without any dressings on; her husband says she picks them off. Electronic Signature(s) Signed: 09/27/2022 2:49:19 PM By: Duanne Guess MD FACS Entered By: Duanne Guess on 09/27/2022 14:49:18 -------------------------------------------------------------------------------- Physical Exam Details Patient Name: Date of Service: Cheryl [name] NNA [name]. 09/27/2022 2:15 PM Medical Record Number: [id number] Patient Account Number: [id number] Date of Birth/Sex: Treating RN: 1945-08-05 (77 y.o. F) Primary Care Provider: Georgette Shell Other Clinician: Referring Provider: Treating Provider/Extender: Cassell Clement in Treatment: [address] [place] [name] ([id number]) [id number] Page 2 of 7 Constitutional Hypertensive, asymptomatic. Slightly bradycardic. . . no acute distress. Respiratory Normal work of breathing on room air. Notes 09/27/2022:  Her right leg has healed. She has a new blister on her left medial thigh. No concern for infection. Electronic Signature(s) Signed: 09/27/2022 2:50:00 PM By: Duanne Guess MD FACS Entered By: Duanne Guess  on 09/27/2022 14:50:00 -------------------------------------------------------------------------------- Physician Orders Details Patient Name: Date of Service: NAVPREET, SZCZYGIEL [place] [name]. 09/27/2022 2:15 PM Medical Record Number: [id number] Patient Account Number: [id number] Date of Birth/Sex: Treating RN: [birth year] (77 y.o. 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 - Dr. Lady Gary Room 3 Anesthetic (In clinic) Topical Lidocaine 5% applied to wound bed - Used in clinic prior to debridement. Bathing/ Shower/ Hygiene May shower and wash wound with soap and water. Wound Treatment Wound #2 - Upper Leg Wound Laterality: Right, Medial Topical: Triamcinolone Every Other Day/30 Days Discharge Instructions: Apply Triamcinolone as needed Prim Dressing: Sorbalgon AG Dressing, 4x4 (in/in) Every Other Day/30 Days ary Discharge Instructions: Apply to wound bed as instructed Secondary Dressing: Zetuvit Plus Silicone Border Dressing 4x4 (in/in) Every Other Day/30 Days Discharge Instructions: Apply silicone border over primary dressing as directed. Wound #4 - Upper Leg Wound Laterality: Left, Medial Topical: Triamcinolone Every Other Day/30 Days Discharge Instructions: Apply Triamcinolone as needed Prim Dressing: Sorbalgon AG Dressing, 4x4 (in/in) Every Other Day/30 Days ary Discharge  Instructions: Apply to wound bed as instructed Secondary Dressing: Zetuvit Plus Silicone Border Dressing 4x4 (in/in) Every Other Day/30 Days Discharge Instructions: Apply silicone border over primary dressing as directed. Electronic Signature(s) Signed: 09/27/2022 2:50:16 PM By: Duanne Guess MD FACS Entered By: Duanne Guess on 09/27/2022 14:50:15 [name] ([id number]) [id number] Page 3 of 7 -------------------------------------------------------------------------------- Problem List Details Patient Name: Date of Service: HARMAN, FERRIN [place] [name]. 09/27/2022 2:15 PM Medical Record Number: [id number] Patient Account Number: [id number] Date of Birth/Sex: Treating RN: 1946-04-24 (77 y.o. 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 09/27/2022 No Yes I10 Essential (primary) hypertension 07/29/2022 No Yes I27.20 Pulmonary hypertension, unspecified 07/29/2022 No Yes Inactive Problems ICD-10 Code Description Active Date Inactive Date L97.811 Non-pressure chronic ulcer of other part of right lower leg limited to breakdown of skin 09/13/2022 09/13/2022 Resolved Problems ICD-10 Code Description Active Date Resolved Date L97.111 Non-pressure chronic ulcer of right thigh limited to breakdown of skin 07/29/2022 07/29/2022 L97.221 Non-pressure chronic ulcer of left calf limited to breakdown of skin 08/12/2022 08/12/2022 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 09/27/2022 2:15 PM Medical Record Number: [id number] Patient Account Number: [id number] Date of Birth/Sex: Treating RN: May 13, 1946 (77 y.o. F) Primary Care Provider: Georgette Shell Other Clinician: Referring Provider: Treating Provider/Extender: Cassell Clement in Treatment: 8 Subjective Chief Complaint Information obtained from Patient Patient seen for complaints of Non-Healing Wound. Cheryl [name], Cheryl [name] ([id number]) [id number] Page 4 of 7 History of Present Illness (HPI) ADMISSION 07/29/2022 This is a 77 year old woman with a past medical history notable for hypertension, hypothyroidism, and dementia. She was referred by her primary care doctor for evaluation of wounds on her bilateral legs. The family practice notes characterizes these as venous stasis ulcers, but  these are actually superficial wounds on the patient's medial bilateral thighs. They appear to start as blisters and the wounds are limited to breakdown of skin. She has an intact blister on her right medial thigh. She does not have any lower leg wounds. She and her husband deny any skin picking habits. These do not appear to be consistent with urine burns from incontinence. They deny any new soaps, detergents, or clothing that could be irritating the area. They have been applying triamcinolone and mupirocin to the wounds. She is currently taking doxycycline, although I do not see any evidence of infection. 08/12/2022: She developed a large blister on her left medial calf. This has broken and she has a new wound in this site. She has a couple of other small unbroken blisters on this leg. The other sites, that were present last visit, are smaller. All sites are clean without concern for infection or any accumulation  of slough. 3/14; patient presents for follow-up. It appears that her previous blister sites have healed however she has developed new wounds to the inner left thigh and medial right thigh/leg. She came in with no dressings on. 09/13/2022: The old wounds have healed but she has a new wound on her right lower leg, just below the [name]. There is slough on the surface. She also has a blister distal to this. 09/27/2022: Her right leg has healed. She has a new blister on her left medial thigh. No concern for infection. She came in again without any dressings on; her husband says she picks them off. Patient History Information obtained from Patient, Caregiver, Chart. Family History Heart Disease, Hypertension - Mother, No family history of Cancer, Diabetes, Hereditary Spherocytosis, Kidney Disease, Lung Disease, Seizures, Stroke, Thyroid Problems, Tuberculosis. Social History Never smoker, Marital Status - Married, Alcohol Use - Never, Drug Use - No History, Caffeine Use - Daily. Medical History Eyes Patient has history of Cataracts - bil removed Denies history of Glaucoma, Optic Neuritis Ear/Nose/Mouth/Throat Patient has history of Chronic sinus problems/congestion Denies history of Middle ear problems Respiratory Patient has history of Asthma Cardiovascular Patient has history of Hypertension Endocrine Denies history of Type I Diabetes, Type II Diabetes Genitourinary Denies history of End Stage Renal Disease Immunological Denies history of Lupus Erythematosus, Raynaudoos, Scleroderma Integumentary (Skin) Denies history of History of Burn Musculoskeletal Patient has history of Osteoarthritis Denies history of Gout, Rheumatoid Arthritis Oncologic Denies history of Received Chemotherapy, Received Radiation Psychiatric Denies history of Anorexia/bulimia, Confinement Anxiety Medical A Surgical History Notes nd Ear/Nose/Mouth/Throat hard of hearing Cardiovascular mitral valve  disorder, pulmonary hypertension Endocrine hypothyroidism Musculoskeletal osteoporesis Objective Constitutional Hypertensive, asymptomatic. Slightly bradycardic. no acute distress. Vitals Time Taken: 2:11 PM, Height: 60 in, Weight: 116.5 lbs, BMI: 22.7, Temperature: 97.6 F, Pulse: 57 bpm, Respiratory Rate: 16 breaths/min, Blood Pressure: 154/71 mmHg. Respiratory XITLALI, ROTHFUS ([id number]) [id number] Page 5 of 7 Normal work of breathing on room air. General Notes: 09/27/2022: Her right leg has healed. She has a new blister on her left medial thigh. No concern for infection. Integumentary (Hair, Skin) Wound #2 status is Open. Original cause of wound was Blister. The date acquired was: 07/25/2022. The wound has been in treatment 8 weeks. The wound is located on the Right,Medial Upper Leg. The wound measures 0.2cm length x 0.2cm width x 0.1cm depth; 0.031cm^2 area and 0.003cm^3 volume. There is Fat Layer (Subcutaneous Tissue) exposed. There is no tunneling or undermining noted. There is a medium amount of serous drainage noted. The wound margin is  flat and intact. There is large (67-100%) pink granulation within the wound bed. There is a small (1-33%) amount of necrotic tissue within the wound bed including Adherent Slough. The periwound skin appearance had no abnormalities noted for texture. The periwound skin appearance had no abnormalities noted for moisture. The periwound skin appearance exhibited: Hemosiderin Staining. Periwound temperature was noted as No Abnormality. The periwound has tenderness on palpation. Wound #4 status is Open. Original cause of wound was Blister. The date acquired was: 09/22/2022. The wound is located on the Left,Medial Upper Leg. The wound measures 3cm length x 0.5cm width x 0.1cm depth; 1.178cm^2 area and 0.118cm^3 volume. There is a medium amount of serous drainage noted. There is large (67-100%) pink granulation within the wound bed.  There is no necrotic tissue within the wound bed. The periwound skin appearance had no abnormalities noted for texture. The periwound skin appearance had no abnormalities noted for moisture. The periwound skin appearance had no abnormalities noted for color. Periwound temperature was noted as No Abnormality. 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 - Dr. Lady Gary Room 3 Anesthetic: (In clinic) Topical Lidocaine 5% applied to wound bed - Used in clinic prior to debridement. Bathing/ Shower/ Hygiene: May shower and wash wound with soap and water. WOUND #2: - Upper Leg Wound Laterality: Right, Medial Topical: Triamcinolone Every Other Day/30 Days Discharge Instructions: Apply Triamcinolone as needed Prim Dressing: Sorbalgon AG Dressing, 4x4 (in/in) Every Other Day/30 Days ary Discharge Instructions: Apply to wound bed as instructed Secondary Dressing: Zetuvit Plus Silicone Border Dressing 4x4 (in/in) Every Other Day/30 Days Discharge Instructions: Apply silicone border over primary dressing as directed. WOUND #4: - Upper Leg Wound Laterality: Left, Medial Topical: Triamcinolone Every Other Day/30 Days Discharge Instructions: Apply Triamcinolone as needed Prim Dressing: Sorbalgon AG Dressing, 4x4 (in/in) Every Other Day/30 Days ary Discharge Instructions: Apply to wound bed as instructed Secondary Dressing: Zetuvit Plus Silicone Border Dressing 4x4 (in/in) Every Other Day/30 Days Discharge Instructions: Apply silicone border over primary dressing as directed. 09/27/2022: Her right leg has healed. She has a new blister on her left medial thigh. No concern for infection. I did not unroof or debride the blister. We will apply some silver alginate and a foam border dressing to help it dry up. I am still at  something of a loss to explain why she develops these blisters. They appear mostly on her upper legs and the upper part of her lower legs. They do not appear to be venous or arterial in origin. We do not seem to be doing a whole lot for this patient here but I want to make sure that she does not fall through the cracks so we will have her come back on a monthly basis for monitoring and to check any new wounds. I will see her in 1 month's time. Electronic Signature(s) Signed: 09/27/2022 2:52:46 PM By: Duanne Guess MD FACS Entered By: Duanne Guess on 09/27/2022 14:52:46 -------------------------------------------------------------------------------- HxROS Details Patient Name: Date of Service: Cheryl Mayo, DO NNA [name]. 09/27/2022 2:15 PM Medical Record Number: [id number] Patient Account Number: [id number] Date of Birth/Sex: Treating RN: [birth year] (77 y.o. F) Primary Care Provider: Georgette Shell Other Clinician: [name] ([id number]) [id number] Page 6 of 7 Referring Provider: Treating Provider/Extender: Cassell Clement in Treatment: 8 Information Obtained From Patient Caregiver Chart Eyes Medical History: Positive for: Cataracts - bil  removed Negative for: Glaucoma; Optic Neuritis Ear/Nose/Mouth/Throat Medical History: Positive for: Chronic sinus problems/congestion Negative for: Middle ear problems Past Medical History Notes: hard of hearing Respiratory Medical History: Positive for: Asthma Cardiovascular Medical History: Positive for: Hypertension Past Medical History Notes: mitral valve disorder, pulmonary hypertension Endocrine Medical History: Negative for: Type I Diabetes; Type II Diabetes Past Medical History Notes: hypothyroidism Genitourinary Medical History: Negative for: End Stage Renal Disease Immunological Medical History: Negative for: Lupus Erythematosus; Raynauds; Scleroderma Integumentary  (Skin) Medical History: Negative for: History of Burn Musculoskeletal Medical History: Positive for: Osteoarthritis Negative for: Gout; Rheumatoid Arthritis Past Medical History Notes: osteoporesis Oncologic Medical History: Negative for: Received Chemotherapy; Received Radiation Psychiatric Medical History: Negative for: Anorexia/bulimia; Confinement Anxiety HBO Extended History Items Ear/Nose/Mouth/Throat: Eyes: Chronic sinus Cataracts problems/congestion Immunizations Rachael DarbyWILKES, Deniah [name] ([id number]) [id number] Page 7 of 7 Pneumococcal Vaccine: Received Pneumococcal Vaccination: Yes Received Pneumococcal Vaccination On or After 60th Birthday: No Implantable Devices No devices added Family and Social History Cancer: No; Diabetes: No; Heart Disease: Yes; Hereditary Spherocytosis: No; Hypertension: Yes - Mother; Kidney Disease: No; Lung Disease: No; Seizures: No; Stroke: No; Thyroid Problems: No; Tuberculosis: No; Never smoker; Marital Status - Married; Alcohol Use: Never; Drug Use: No History; Caffeine Use: Daily; Financial Concerns: No; Food, Clothing or Shelter Needs: No; Support System Lacking: No; Transportation Concerns: No Electronic Signature(s) Signed: 09/27/2022 3:09:48 PM By: Duanne Guessannon, Amarian Botero MD FACS Entered By: Duanne Guessannon, Nira Visscher on 09/27/2022 14:49:24 -------------------------------------------------------------------------------- SuperBill Details Patient Name: Date of Service: Cheryl MayoWILKES, DO NNA [name]. 09/27/2022 Medical Record Number: [id number] Patient Account Number: [id number] Date of Birth/Sex: Treating RN: 09/16/1945 (77 y.o. 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 I10 Essential (primary)  hypertension I27.20 Pulmonary hypertension, unspecified Facility Procedures : CPT4 Code: [id number] Description: 99214 - WOUND CARE VISIT-LEV 4 EST PT Modifier: Quantity: 1 Physician Procedures : CPT4 Code Description Modifier [id number] 99213 - WC PHYS LEVEL 3 - EST PT ICD-10 Diagnosis Description 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

## 2022-09-28 NOTE — Progress Notes (Signed)
Cheryl, [name] E ([id number]) [id number] Page 1 of 9 Visit Report for 09/27/2022 Arrival Information Details Patient Name: Date of Service: Cheryl [name], Cheryl [name] [place] E. 09/27/2022 2:15 PM Medical Record Number: [id number] Patient Account Number: [id number] Date of Birth/Sex: Treating RN: Jul 09, 1945 (77 y.o. [name] Primary Care Silveria Botz: Georgette Shell Other Clinician: Referring Keelee Yankey: Treating Raeshawn Vo/Extender: Cassell Clement in Treatment: 8 Visit Information History Since Last Visit Added or deleted any medications: No Patient Arrived: [name] Any new allergies or adverse reactions: No Arrival Time: 14:11 Had a fall or experienced change in No Accompanied By: spouse activities of daily living that may affect Transfer Assistance: None risk of falls: Patient Identification Verified: Yes Signs or symptoms of abuse/neglect since last visito No Patient Requires Transmission-Based Precautions: No Hospitalized since last visit: No Patient Has Alerts: No Implantable device outside of the clinic excluding No cellular tissue based products placed in the center since last visit: Has Dressing in Place as Prescribed: No Pain Present Now: No Electronic Signature(s) Signed: 09/27/2022 5:01:04 PM By: Karie Schwalbe RN Entered By: Karie Schwalbe on 09/27/2022 14:12:14 -------------------------------------------------------------------------------- Clinic Level of Care Assessment Details Patient Name: Date of Service: TIOMBE, Cheryl [place] E. 09/27/2022 2:15 PM Medical Record Number: [id number] Patient Account Number: [id number] Date of Birth/Sex: Treating RN: 1946/01/07 (77 y.o. [name] Primary Care Lekeya Rollings: Georgette Shell Other Clinician: Referring Harrison Paulson: Treating Roseann Kees/Extender: Cassell Clement in Treatment: 8 Clinic Level of Care Assessment Items TOOL 4 Quantity Score X- 1 0 Use when only an EandM is performed on  FOLLOW-UP visit ASSESSMENTS - Nursing Assessment / Reassessment X- 1 10 Reassessment of Co-morbidities (includes updates in patient status) X- 1 5 Reassessment of Adherence to Treatment Plan ASSESSMENTS - Wound and Skin A ssessment / Reassessment []  - 0 Simple Wound Assessment / Reassessment - one wound X- 2 5 Complex Wound Assessment / Reassessment - multiple wounds X- 1 10 Dermatologic / Skin Assessment (not related to wound area) ASSESSMENTS - Focused Assessment X- 1 5 Circumferential Edema Measurements - multi extremities []  - 0 Nutritional Assessment / Counseling / Intervention []  - 0 Lower Extremity Assessment (monofilament, tuning fork, pulses) []  - 0 Peripheral Arterial Disease Assessment (using hand held doppler) ASSESSMENTS - Ostomy and/or Continence Assessment and Care []  - 0 Incontinence Assessment and Management []  - 0 Ostomy Care Assessment and Management (repouching, etc.) PROCESS - Coordination of Care X - Simple Patient / Family Education for ongoing care 1 15 [place], [place] E ([id number]) [phone].pdf Page 2 of 9 []  - 0 Complex (extensive) Patient / Family Education for ongoing care X- 1 10 Staff obtains Consents, Records, T Results / Process Orders est X- 1 10 Staff telephones HHA, Nursing Homes / Clarify orders / etc []  - 0 Routine Transfer to another Facility (non-emergent condition) []  - 0 Routine Hospital Admission (non-emergent condition) []  - 0 New Admissions / Manufacturing engineer / Ordering NPWT Apligraf, etc. , []  - 0 Emergency Hospital Admission (emergent condition) X- 1 10 Simple Discharge Coordination []  - 0 Complex (extensive) Discharge Coordination PROCESS - Special Needs []  - 0 Pediatric / Minor Patient Management []  - 0 Isolation Patient Management []  - 0 Hearing / Language / Visual special needs []  - 0 Assessment of Community assistance (transportation, D/C planning, etc.) []  - 0 Additional  assistance / Altered mentation []  - 0 Support Surface(s) Assessment (bed, cushion, seat, etc.) INTERVENTIONS - Wound Cleansing / Measurement []  - 0 Simple Wound Cleansing - one wound []  - 0  Complex Wound Cleansing - multiple wounds []  - 0 Wound Imaging (photographs - any number of wounds) []  - 0 Wound Tracing (instead of photographs) []  - 0 Simple Wound Measurement - one wound []  - 0 Complex Wound Measurement - multiple wounds INTERVENTIONS - Wound Dressings []  - 0 Small Wound Dressing one or multiple wounds X- 2 15 Medium Wound Dressing one or multiple wounds []  - 0 Large Wound Dressing one or multiple wounds []  - 0 Application of Medications - topical []  - 0 Application of Medications - injection INTERVENTIONS - Miscellaneous []  - 0 External ear exam []  - 0 Specimen Collection (cultures, biopsies, blood, body fluids, etc.) []  - 0 Specimen(s) / Culture(s) sent or taken to Lab for analysis []  - 0 Patient Transfer (multiple staff / Nurse, adult / Similar devices) []  - 0 Simple Staple / Suture removal (25 or less) []  - 0 Complex Staple / Suture removal (26 or more) []  - 0 Hypo / Hyperglycemic Management (close monitor of Blood Glucose) []  - 0 Ankle / Brachial Index (ABI) - do not check if billed separately X- 1 5 Vital Signs Has the patient been seen at the hospital within the last three years: Yes Total Score: 120 Level Of Care: New/Established - Level 4 Electronic Signature(s) Signed: 09/27/2022 5:01:04 PM By: Karie Schwalbe RN Entered By: Karie Schwalbe on 09/27/2022 16:20:15 [name] ([id number]) [id number] Page 3 of 9 -------------------------------------------------------------------------------- Encounter Discharge Information Details Patient Name: Date of Service: Cheryl, [name] [place] E. 09/27/2022 2:15 PM Medical Record Number: [id number] Patient Account Number: [id number] Date of Birth/Sex: Treating RN: 1945/09/30 (77 y.o. [name] Primary Care Shawndell Varas: Georgette Shell Other Clinician: Referring Depaul Arizpe: Treating Catlin Aycock/Extender: Cassell Clement in Treatment: 8 Encounter Discharge Information Items Discharge Condition: Stable Ambulatory Status: Cane Discharge Destination: Home Transportation: Private Auto Accompanied By: spouse Schedule Follow-up Appointment: Yes Clinical Summary of Care: Patient Declined Electronic Signature(s) Signed: 09/27/2022 5:01:04 PM By: Karie Schwalbe RN Entered By: Karie Schwalbe on 09/27/2022 16:21:05 -------------------------------------------------------------------------------- Lower Extremity Assessment Details Patient Name: Date of Service: [name] 09/27/2022 2:15 PM Medical Record Number: [id number] Patient Account Number: [id number] Date of Birth/Sex: Treating RN: [birth year] (77 y.o. [name] Primary Care Jaely Silman: Georgette Shell Other Clinician: Referring Aletheia Tangredi: Treating Shanette Tamargo/Extender: Cassell Clement in Treatment: 8 Edema Assessment Assessed: Kyra Searles: No] [Right: No] Edema: [Left: No] [Right: No] Calf Left: Right: Point of Measurement: From Medial Instep 31.4 cm 31 cm Ankle Left: Right: Point of Measurement: From Medial Instep 19 cm 18.5 cm Vascular Assessment Pulses: Dorsalis Pedis Palpable: [Left:Yes] [Right:Yes] Electronic Signature(s) Signed: 09/27/2022 5:01:04 PM By: Karie Schwalbe RN Entered By: Karie Schwalbe on 09/27/2022 14:13:48 -------------------------------------------------------------------------------- Multi Wound Chart Details Patient Name: Date of Service: [name] 09/27/2022 2:15 PM Medical Record Number: [id number] Patient Account Number: [id number] Date of Birth/Sex: Treating RN: 11/07/1945 (77 y.o. F) Primary Care Amye Grego: Georgette Shell Other Clinician: Referring Will Schier: Treating Shaquel Chavous/Extender: Cassell Clement in  Treatment: [address], [place] E ([id number]) [id number] Page 4 of 9 Vital Signs Height(in): 60 Pulse(bpm): 57 Weight(lbs): 116.5 Blood Pressure(mmHg): 154/71 Body Mass Index(BMI): 22.7 Temperature(F): 97.6 Respiratory Rate(breaths/min): 16 [2:Photos:] [N/A:N/A] Right, Medial Upper Leg Left, Medial Upper Leg N/A Wound Location: Blister Blister N/A Wounding Event: Inflammatory Inflammatory N/A Primary Etiology: Cataracts, Chronic sinus Cataracts, Chronic sinus N/A Comorbid History: problems/congestion, Asthma, problems/congestion, Asthma, Hypertension, Osteoarthritis Hypertension, Osteoarthritis 07/25/2022 09/22/2022 N/A Date Acquired: 8 0 N/A Weeks of Treatment: Open Open N/A  Wound Status: No No N/A Wound Recurrence: 0.2x0.2x0.1 3x0.5x0.1 N/A Measurements L x W x D (cm) 0.031 1.178 N/A A (cm) : rea 0.003 0.118 N/A Volume (cm) : 98.90% N/A N/A % Reduction in Area: 99.00% N/A N/A % Reduction in Volume: Partial Thickness Full Thickness Without Exposed N/A Classification: Support Structures Medium Medium N/A Exudate Amount: Serous Serous N/A Exudate Type: amber amber N/A Exudate Color: Flat and Intact N/A N/A Wound Margin: Large (67-100%) Large (67-100%) N/A Granulation Amount: Pink Pink N/A Granulation Quality: Small (1-33%) None Present (0%) N/A Necrotic Amount: Fat Layer (Subcutaneous Tissue): Yes Fascia: No N/A Exposed Structures: Fascia: No Fat Layer (Subcutaneous Tissue): No Tendon: No Tendon: No Muscle: No Muscle: No Joint: No Joint: No Bone: No Bone: No Large (67-100%) N/A N/A Epithelialization: No Abnormalities Noted No Abnormalities Noted N/A Periwound Skin Texture: No Abnormalities Noted No Abnormalities Noted N/A Periwound Skin Moisture: Hemosiderin Staining: Yes No Abnormalities Noted N/A Periwound Skin Color: No Abnormality No Abnormality N/A Temperature: Yes N/A N/A Tenderness on Palpation: Treatment  Notes Electronic Signature(s) Signed: 09/27/2022 2:48:16 PM By: Duanne Guess MD FACS Entered By: Duanne Guess on 09/27/2022 14:48:16 -------------------------------------------------------------------------------- Multi-Disciplinary Care Plan Details Patient Name: Date of Service: [name], DO [name] 09/27/2022 2:15 PM Medical Record Number: [id number] Patient Account Number: [id number] Date of Birth/Sex: Treating RN: 06-03-1946 (77 y.o. [name] Primary Care Earla Charlie: Georgette Shell Other Clinician: Referring Chanah Tidmore: Treating Jamile Sivils/Extender: Cassell Clement in Treatment: 8 Multidisciplinary Care Plan reviewed with physician NAYLEEN, COWING ([id number]) [id number] Page 5 of 9 Active Inactive Wound/Skin Impairment Nursing Diagnoses: Impaired tissue integrity Knowledge deficit related to ulceration/compromised skin integrity Goals: Patient/caregiver will verbalize understanding of skin care regimen Date Initiated: 07/29/2022 Target Resolution Date: 11/18/2022 Goal Status: Active Ulcer/skin breakdown will have a volume reduction of 30% by week 4 Date Initiated: 07/29/2022 Target Resolution Date: 11/18/2022 Goal Status: Active Interventions: Assess patient/caregiver ability to obtain necessary supplies Assess patient/caregiver ability to perform ulcer/skin care regimen upon admission and as needed Assess ulceration(s) every visit Provide education on ulcer and skin care Treatment Activities: Skin care regimen initiated : 07/29/2022 Topical wound management initiated : 07/29/2022 Notes: Electronic Signature(s) Signed: 09/27/2022 5:01:04 PM By: Karie Schwalbe RN Entered By: Karie Schwalbe on 09/27/2022 16:18:46 -------------------------------------------------------------------------------- Pain Assessment Details Patient Name: Date of Service: [name] 09/27/2022 2:15 PM Medical Record Number: [id number] Patient  Account Number: [id number] Date of Birth/Sex: Treating RN: Nov 20, 1945 (77 y.o. [name] Primary Care Suman Trivedi: Georgette Shell Other Clinician: Referring Herman Fiero: Treating Anan Dapolito/Extender: Cassell Clement in Treatment: 8 Active Problems Location of Pain Severity and Description of Pain Patient Has Paino No Site Locations Pain Management and Medication Current Pain Management: Electronic Signature(s) Signed: 09/27/2022 5:01:04 PM By: Karie Schwalbe RN [name], Blimi E ([id number]) [phone].pdf Page 6 of 9 Entered By: Karie Schwalbe on 09/27/2022 14:13:38 -------------------------------------------------------------------------------- Patient/Caregiver Education Details Patient Name: Date of Service: DRISHYA, WETMORE 4/9/2024andnbsp2:15 PM Medical Record Number: [id number] Patient Account Number: [id number] Date of Birth/Gender: Treating RN: 03-Aug-1945 (77 y.o. [name] Primary Care Physician: Georgette Shell Other Clinician: Referring Physician: Treating Physician/Extender: Cassell Clement in Treatment: 8 Education Assessment Education Provided To: Patient Education Topics Provided Wound/Skin Impairment: Methods: Explain/Verbal Responses: Return demonstration correctly Electronic Signature(s) Signed: 09/27/2022 5:01:04 PM By: Karie Schwalbe RN Entered By: Karie Schwalbe on 09/27/2022 16:19:00 -------------------------------------------------------------------------------- Wound Assessment Details Patient Name: Date of Service: [name] 09/27/2022 2:15 PM Medical Record Number: [id number] Patient  Account Number: [id number] Date of Birth/Sex: Treating RN: 1945-09-19 (77 y.o. [name] Primary Care Netanya Yazdani: Georgette Shell Other Clinician: Referring Khara Renaud: Treating Sabriyah Wilcher/Extender: Cassell Clement in Treatment: 8 Wound Status Wound Number: 2 Primary  Inflammatory Etiology: Wound Location: Right, Medial Upper Leg Wound Open Wounding Event: Blister Status: Date Acquired: 07/25/2022 Comorbid Cataracts, Chronic sinus problems/congestion, Asthma, Weeks Of Treatment: 8 History: Hypertension, Osteoarthritis Clustered Wound: No Photos Wound Measurements Length: (cm) 0.2 Width: (cm) 0.2 Depth: (cm) 0.1 Area: (cm) 0.031 Volume: (cm) 0.003 % Reduction in Area: 98.9% % Reduction in Volume: 99% Epithelialization: Large (67-100%) Tunneling: No Undermining: No Wound Description Classification: Partial Thickness SIMI, BRIEL E ([id number]) Wound Margin: Flat and Intact Exudate Amount: Medium Exudate Type: Serous Exudate Color: amber Foul Odor After Cleansing: No [id number] Page 7 of 9 Slough/Fibrino No Wound Bed Granulation Amount: Large (67-100%) Exposed Structure Granulation Quality: Pink Fascia Exposed: No Necrotic Amount: Small (1-33%) Fat Layer (Subcutaneous Tissue) Exposed: Yes Necrotic Quality: Adherent Slough Tendon Exposed: No Muscle Exposed: No Joint Exposed: No Bone Exposed: No Periwound Skin Texture Texture Color No Abnormalities Noted: Yes No Abnormalities Noted: No Hemosiderin Staining: Yes Moisture No Abnormalities Noted: Yes Temperature / Pain Temperature: No Abnormality Tenderness on Palpation: Yes Treatment Notes Wound #2 (Upper Leg) Wound Laterality: Right, Medial Cleanser Peri-Wound Care Topical Triamcinolone Discharge Instruction: Apply Triamcinolone as needed Primary Dressing Sorbalgon AG Dressing, 4x4 (in/in) Discharge Instruction: Apply to wound bed as instructed Secondary Dressing Zetuvit Plus Silicone Border Dressing 4x4 (in/in) Discharge Instruction: Apply silicone border over primary dressing as directed. Secured With Compression Wrap Compression Stockings Facilities manager) Signed: 09/27/2022 5:01:04 PM By: Karie Schwalbe RN Entered By:  Karie Schwalbe on 09/27/2022 14:26:16 -------------------------------------------------------------------------------- Wound Assessment Details Patient Name: Date of Service: [name] 09/27/2022 2:15 PM Medical Record Number: [id number] Patient Account Number: [id number] Date of Birth/Sex: Treating RN: Jul 27, 1945 (77 y.o. [name] Primary Care Joice Nazario: Georgette Shell Other Clinician: Referring Ira Busbin: Treating Hampton Wixom/Extender: Cassell Clement in Treatment: 8 Wound Status Wound Number: 4 Primary Inflammatory Etiology: Wound Location: Left, Medial Upper Leg Wound Open Wounding Event: Blister Status: Date Acquired: 09/22/2022 Comorbid Cataracts, Chronic sinus problems/congestion, Asthma, Weeks Of Treatment: 0 History: Hypertension, Osteoarthritis Clustered Wound: No Photos LATAJA, [place] ([id number]) [id number] Page 8 of 9 Wound Measurements Length: (cm) Width: (cm) Depth: (cm) Area: (cm) Volume: (cm) 3 % Reduction in Area: 0.5 % Reduction in Volume: 0.1 1.178 0.118 Wound Description Classification: Full Thickness Without Exposed Suppor Exudate Amount: Medium Exudate Type: Serous Exudate Color: amber t Structures Foul Odor After Cleansing: No Slough/Fibrino No Wound Bed Granulation Amount: Large (67-100%) Exposed Structure Granulation Quality: Pink Fascia Exposed: No Necrotic Amount: None Present (0%) Fat Layer (Subcutaneous Tissue) Exposed: No Tendon Exposed: No Muscle Exposed: No Joint Exposed: No Bone Exposed: No Periwound Skin Texture Texture Color No Abnormalities Noted: Yes No Abnormalities Noted: Yes Moisture Temperature / Pain No Abnormalities Noted: Yes Temperature: No Abnormality Treatment Notes Wound #4 (Upper Leg) Wound Laterality: Left, Medial Cleanser Peri-Wound Care Topical Triamcinolone Discharge Instruction: Apply Triamcinolone as needed Primary Dressing Sorbalgon AG  Dressing, 4x4 (in/in) Discharge Instruction: Apply to wound bed as instructed Secondary Dressing Zetuvit Plus Silicone Border Dressing 4x4 (in/in) Discharge Instruction: Apply silicone border over primary dressing as directed. Secured With Compression Wrap Compression Stockings Facilities manager) Signed: 09/27/2022 5:01:04 PM By: Karie Schwalbe RN Entered By: Karie Schwalbe on 09/27/2022 14:25:15 [name] ([id number]) [id number] Page 9 of 9 -------------------------------------------------------------------------------- Vitals  Details Patient Name: Date of Service: Fannie KneeWILKES, DO [place] E. 09/27/2022 2:15 PM Medical Record Number: [id number] Patient Account Number: [id number] Date of Birth/Sex: Treating RN: 03/03/1946 (77 y.o. Katrinka BlazingF) Scotton, Joanne Primary Care Ikechukwu Cerny: Georgette ShellSpencer, Sara Other Clinician: Referring Conception Doebler: Treating Demitrius Crass/Extender: Cassell Clementannon, Jennifer Spencer, Sara Weeks in Treatment: 8 Vital Signs Time Taken: 14:11 Temperature (F): 97.6 Height (in): 60 Pulse (bpm): 57 Weight (lbs): 116.5 Respiratory Rate (breaths/min): 16 Body Mass Index (BMI): 22.7 Blood Pressure (mmHg): 154/71 Reference Range: 80 - 120 mg / dl Electronic Signature(s) Signed: 09/27/2022 5:01:04 PM By: Karie SchwalbeScotton, Joanne RN Entered By: Karie SchwalbeScotton, Joanne on 09/27/2022 14:13:32

## 2022-10-25 ENCOUNTER — Ambulatory Visit (HOSPITAL_BASED_OUTPATIENT_CLINIC_OR_DEPARTMENT_OTHER): Payer: Medicare HMO | Admitting: General Surgery

## 2022-11-02 ENCOUNTER — Encounter (HOSPITAL_BASED_OUTPATIENT_CLINIC_OR_DEPARTMENT_OTHER): Payer: Medicare HMO | Attending: General Surgery | Admitting: General Surgery

## 2022-11-02 DIAGNOSIS — E039 Hypothyroidism, unspecified: Secondary | ICD-10-CM | POA: Insufficient documentation

## 2022-11-02 DIAGNOSIS — I272 Pulmonary hypertension, unspecified: Secondary | ICD-10-CM | POA: Insufficient documentation

## 2022-11-02 DIAGNOSIS — L97121 Non-pressure chronic ulcer of left thigh limited to breakdown of skin: Secondary | ICD-10-CM | POA: Insufficient documentation

## 2022-11-02 DIAGNOSIS — I10 Essential (primary) hypertension: Secondary | ICD-10-CM | POA: Insufficient documentation

## 2022-11-02 DIAGNOSIS — F0390 Unspecified dementia without behavioral disturbance: Secondary | ICD-10-CM | POA: Diagnosis not present

## 2022-11-29 NOTE — Progress Notes (Signed)
SEHAM, WYSS [name] ([id number]) [id number] Page 1 of 9 Visit Report for 11/02/2022 Arrival Information Details Patient Name: Date of Service: Cheryl [name], Cheryl [name]. 11/02/2022 1:15 PM Medical Record Number: [id number] Patient Account Number: [id number] Date of Birth/Sex: Treating RN: [birth year] (77 y.o. F) Primary Care Cheryl [name]: Cheryl [name] Other Clinician: Referring Sitara Cashwell: Treating Cheryl [name] in Treatment: 13 Visit Information History Since Last Visit All ordered tests and consults were completed: No Patient Arrived: Cheryl [name] Added or deleted any medications: No Arrival Time: 13:12 Any new allergies or adverse reactions: No Accompanied By: husband Had a fall or experienced change in No Transfer Assistance: None activities of daily living that may affect Patient Identification Verified: Yes risk of falls: Secondary Verification Process Completed: Yes Signs or symptoms of abuse/neglect since last visito No Patient Requires Transmission-Based Precautions: No Hospitalized since last visit: No Patient Has Alerts: No Implantable device outside of the clinic excluding No cellular tissue based products placed in the center since last visit: Pain Present Now: No Electronic Signature(s) Signed: 11/02/2022 4:09:24 PM By: Cheryl [name] on 11/02/2022 [name] -------------------------------------------------------------------------------- Clinic Level of Care Assessment Details Patient Name: Date of Service: Cheryl [name], Cheryl [name]. 11/02/2022 1:15 PM Medical Record Number: [id number] Patient Account Number: [id number] Date of Birth/Sex: Treating RN: 13-Aug-1945 (77 y.o. Cheryl [name] Primary Care Cheryl [name] Other Clinician: Referring Cheryl [name]: Treating Cheryl [name]/Extender: Cheryl [name] in Treatment: 13 Clinic Level of Care Assessment Items TOOL 4 Quantity Score X- 1  0 Use when only an EandM is performed on FOLLOW-UP visit ASSESSMENTS - Nursing Assessment / Reassessment X- 1 10 Reassessment of Co-morbidities (includes updates in patient status) X- 1 5 Reassessment of Adherence to Treatment Plan ASSESSMENTS - Wound and Skin A ssessment / Reassessment []  - 0 Simple Wound Assessment / Reassessment - one wound []  - 0 Complex Wound Assessment / Reassessment - multiple wounds X- 1 10 Dermatologic / Skin Assessment (not related to wound area) ASSESSMENTS - Focused Assessment []  - 0 Circumferential Edema Measurements - multi extremities []  - 0 Nutritional Assessment / Counseling / Intervention []  - 0 Lower Extremity Assessment (monofilament, tuning fork, pulses) []  - 0 Peripheral Arterial Disease Assessment (using hand held doppler) ASSESSMENTS - Ostomy and/or Continence Assessment and Care []  - 0 Incontinence Assessment and Management []  - 0 Ostomy Care Assessment and Management (repouching, etc.) PROCESS - Coordination of Care X - Simple Patient / Family Education for ongoing care 1 15 Cheryl [name] ([id number]) [phone].pdf Page 2 of 9 []  - 0 Complex (extensive) Patient / Family Education for ongoing care []  - 0 Staff obtains Chiropractor, Records, T Results / Process Orders est X- 1 10 Staff telephones HHA, Nursing Homes / Clarify orders / etc []  - 0 Routine Transfer to another Facility (non-emergent condition) []  - 0 Routine Hospital Admission (non-emergent condition) []  - 0 New Admissions / Manufacturing engineer / Ordering NPWT Apligraf, etc. , []  - 0 Emergency Hospital Admission (emergent condition) X- 1 10 Simple Discharge Coordination []  - 0 Complex (extensive) Discharge Coordination PROCESS - Special Needs []  - 0 Pediatric / Minor Patient Management []  - 0 Isolation Patient Management []  - 0 Hearing / Language / Visual special needs []  - 0 Assessment of Community assistance (transportation, D/C  planning, etc.) []  - 0 Additional assistance / Altered mentation []  - 0 Support Surface(s) Assessment (bed, cushion, seat, etc.) INTERVENTIONS - Wound Cleansing / Measurement []  - 0 Simple Wound Cleansing - one wound []  -  0 Complex Wound Cleansing - multiple wounds X- 1 5 Wound Imaging (photographs - any number of wounds) []  - 0 Wound Tracing (instead of photographs) []  - 0 Simple Wound Measurement - one wound []  - 0 Complex Wound Measurement - multiple wounds INTERVENTIONS - Wound Dressings []  - 0 Small Wound Dressing one or multiple wounds []  - 0 Medium Wound Dressing one or multiple wounds []  - 0 Large Wound Dressing one or multiple wounds []  - 0 Application of Medications - topical []  - 0 Application of Medications - injection INTERVENTIONS - Miscellaneous []  - 0 External ear exam []  - 0 Specimen Collection (cultures, biopsies, blood, body fluids, etc.) []  - 0 Specimen(s) / Culture(s) sent or taken to Lab for analysis []  - 0 Patient Transfer (multiple staff / Nurse, adult / Similar devices) []  - 0 Simple Staple / Suture removal (25 or less) []  - 0 Complex Staple / Suture removal (26 or more) []  - 0 Hypo / Hyperglycemic Management (close monitor of Blood Glucose) []  - 0 Ankle / Brachial Index (ABI) - Cheryl not check if billed separately X- 1 5 Vital Signs Has the patient been seen at the hospital within the last three years: Yes Total Score: 70 Level Of Care: New/Established - Level 2 Electronic Signature(s) Signed: 11/29/2022 7:49:05 AM By: Cheryl [name] Entered By: Cheryl [name] on 11/02/2022 13:43:18 Cheryl [name] ([id number]) [id number] Page 3 of 9 -------------------------------------------------------------------------------- Encounter Discharge Information Details Patient Name: Date of Service: Cheryl [name]. 11/02/2022 1:15 PM Medical Record Number: [id number] Patient Account Number: [id number] Date of Birth/Sex: Treating  RN: 03/13/1946 (77 y.o. Cheryl [name] Primary Care Nena Hampe: Cheryl [name] Other Clinician: Referring Cheryl [name]: Treating Cheryl [name]/Extender: Cheryl [name] in Treatment: 13 Encounter Discharge Information Items Discharge Condition: Stable Ambulatory Status: Ambulatory Discharge Destination: Home Transportation: Private Auto Accompanied By: husband Schedule Follow-up Appointment: Yes Clinical Summary of Care: Patient Declined Electronic Signature(s) Signed: 11/29/2022 7:49:05 AM By: Cheryl [name] Entered By: Cheryl [name] on 11/02/2022 13:44:24 -------------------------------------------------------------------------------- Lower Extremity Assessment Details Patient Name: Date of Service: Cheryl [name]. 11/02/2022 1:15 PM Medical Record Number: [id number] Patient Account Number: [id number] Date of Birth/Sex: Treating RN: Aug 04, 1945 (77 y.o. Cheryl [name] Primary Care Kamyia Thomason: Cheryl [name] Other Clinician: Referring Jaeceon Michelin: Treating Domingue Coltrain/Extender: Cheryl [name] in Treatment: 13 Edema Assessment Assessed: Kyra Searles: No] Franne Forts: No] Edema: [Left: No] [Right: No] Calf Left: Right: Point of Measurement: From Medial Instep 31.4 cm 31 cm Ankle Left: Right: Point of Measurement: From Medial Instep 19 cm 18.5 cm Vascular Assessment Pulses: Dorsalis Pedis Palpable: [Left:Yes] [Right:Yes] Electronic Signature(s) Signed: 11/29/2022 7:49:05 AM By: Cheryl [name] Entered By: Cheryl [name] on 11/02/2022 13:28:10 -------------------------------------------------------------------------------- Multi Wound Chart Details Patient Name: Date of Service: Cheryl [name]. 11/02/2022 1:15 PM Medical Record Number: [id number] Patient Account Number: [id number] Date of Birth/Sex: Treating RN: June 11, 1946 (77 y.o. F) Primary Care Favio Moder: Cheryl [name] Other Clinician: Referring Kidada Ging: Treating Lyndle Pang/Extender: Cheryl [name] in Treatment: [address], [place] [name] ([id number]) [id number] Page 4 of 9 Vital Signs Height(in): 60 Pulse(bpm): 55 Weight(lbs): 116.5 Blood Pressure(mmHg): 139/57 Body Mass Index(BMI): 22.7 Temperature(F): 97.9 Respiratory Rate(breaths/min): 20 [2:Photos:] [N/A:N/A] Right, Medial Upper Leg Left, Medial Upper Leg N/A Wound Location: Blister Blister N/A Wounding Event: Inflammatory Inflammatory N/A Primary Etiology: Cataracts, Chronic sinus Cataracts, Chronic sinus N/A Comorbid History: problems/congestion, Asthma, problems/congestion, Asthma, Hypertension, Osteoarthritis Hypertension, Osteoarthritis 07/25/2022 09/22/2022 N/A Date Acquired: 13 5 N/A Weeks of Treatment: Healed - Epithelialized Healed -  Epithelialized N/A Wound Status: No No N/A Wound Recurrence: 0x0x0 0x0x0 N/A Measurements L x W x D (cm) 0 0 N/A A (cm) : rea 0 0 N/A Volume (cm) : 100.00% 100.00% N/A % Reduction in Area: 100.00% 100.00% N/A % Reduction in Volume: Partial Thickness Full Thickness Without Exposed N/A Classification: Support Structures Medium Medium N/A Exudate Amount: Serous Serous N/A Exudate Type: amber amber N/A Exudate Color: Flat and Intact N/A N/A Wound Margin: Large (67-100%) Large (67-100%) N/A Granulation Amount: Pink Pink N/A Granulation Quality: Small (1-33%) None Present (0%) N/A Necrotic Amount: Fat Layer (Subcutaneous Tissue): Yes Fascia: No N/A Exposed Structures: Fascia: No Fat Layer (Subcutaneous Tissue): No Tendon: No Tendon: No Muscle: No Muscle: No Joint: No Joint: No Bone: No Bone: No Large (67-100%) N/A N/A Epithelialization: No Abnormalities Noted No Abnormalities Noted N/A Periwound Skin Texture: No Abnormalities Noted No Abnormalities Noted N/A Periwound Skin Moisture: Hemosiderin Staining: Yes No Abnormalities Noted N/A Periwound Skin Color: No Abnormality No Abnormality  N/A Temperature: Yes N/A N/A Tenderness on Palpation: Treatment Notes Wound #2 (Upper Leg) Wound Laterality: Right, Medial Cleanser Peri-Wound Care Topical Primary Dressing Secondary Dressing Secured With Compression Wrap Compression Stockings Add-Ons Wound #4 (Upper Leg) Wound Laterality: Left, Medial Cleanser JOSHLYN, SCHIMMOELLER [name] ([id number]) [phone].pdf Page 5 of 9 Peri-Wound Care Topical Primary Dressing Secondary Dressing Secured With Compression Wrap Compression Stockings Add-Ons Electronic Signature(s) Signed: 11/02/2022 2:07:35 PM By: Duanne Guess MD FACS Entered By: Duanne Guess on 11/02/2022 14:07:35 -------------------------------------------------------------------------------- Multi-Disciplinary Care Plan Details Patient Name: Date of Service: Cheryl [name]. 11/02/2022 1:15 PM Medical Record Number: [id number] Patient Account Number: [id number] Date of Birth/Sex: Treating RN: 03-04-1946 (78 y.o. Cheryl [name] Primary Care Daneya Hartgrove: Cheryl [name] Other Clinician: Referring Niah Heinle: Treating Gurpreet Mikhail/Extender: Cheryl [name] in Treatment: 13 Multidisciplinary Care Plan reviewed with physician Active Inactive Electronic Signature(s) Signed: 11/29/2022 7:49:05 AM By: Cheryl [name] Entered By: Cheryl [name] on 11/02/2022 13:45:51 -------------------------------------------------------------------------------- Pain Assessment Details Patient Name: Date of Service: Cheryl [name]. 11/02/2022 1:15 PM Medical Record Number: [id number] Patient Account Number: [id number] Date of Birth/Sex: Treating RN: 01-Aug-1945 (77 y.o. F) Primary Care Lyndsay Talamante: Cheryl [name] Other Clinician: Referring Glorious Flicker: Treating Hakop Humbarger/Extender: Cheryl [name] in Treatment: 13 Active Problems Location of Pain Severity and Description of Pain Patient Has Paino No Site Locations [place], [place] [name]  ([id number]) [id number] Page 6 of 9 Pain Management and Medication Current Pain Management: Electronic Signature(s) Signed: 11/02/2022 4:09:24 PM By: Cheryl [name] on 11/02/2022 13:14:08 -------------------------------------------------------------------------------- Patient/Caregiver Education Details Patient Name: Date of Service: Cheryl [name] 5/15/2024andnbsp1:15 PM Medical Record Number: [id number] Patient Account Number: [id number] Date of Birth/Gender: Treating RN: 1946/01/06 (77 y.o. Cheryl [name] Primary Care Physician: Cheryl [name] Other Clinician: Referring Physician: Treating Physician/Extender: Cheryl [name] in Treatment: 13 Education Assessment Education Provided To: Patient Education Topics Provided Wound/Skin Impairment: Methods: Explain/Verbal Responses: State content correctly Electronic Signature(s) Signed: 11/29/2022 7:49:05 AM By: Cheryl [name] Entered By: Cheryl [name] on 11/02/2022 13:30:44 -------------------------------------------------------------------------------- Wound Assessment Details Patient Name: Date of Service: Cheryl [name]. 11/02/2022 1:15 PM Medical Record Number: [id number] Patient Account Number: [id number] Date of Birth/Sex: Treating RN: 23-Dec-1945 (77 y.o. Cheryl [name] Primary Care Torry Adamczak: Cheryl [name] Other Clinician: Referring Tomara Youngberg: Treating Megan Presti/Extender: Cheryl [name] in Treatment: 13 Wound Status Wound Number: 2 Primary Inflammatory Etiology: Wound Location: Right, Medial Upper Leg Wound Healed - Epithelialized Wounding Event: Blister Status: Date Acquired: 07/25/2022 Comorbid  Cataracts, Chronic sinus problems/congestion, Asthma, Weeks Of Treatment: 13 History: Hypertension, Osteoarthritis Clustered Wound: No Cheryl [name], Cheryl [name] ([id number]) [id number] Page 7 of 9 Photos Wound  Measurements Length: (cm) Width: (cm) Depth: (cm) Area: (cm) Volume: (cm) 0 % Reduction in Area: 100% 0 % Reduction in Volume: 100% 0 Epithelialization: Large (67-100%) 0 0 Wound Description Classification: Partial Thickness Wound Margin: Flat and Intact Exudate Amount: Medium Exudate Type: Serous Exudate Color: amber Foul Odor After Cleansing: No Slough/Fibrino No Wound Bed Granulation Amount: Large (67-100%) Exposed Structure Granulation Quality: Pink Fascia Exposed: No Necrotic Amount: Small (1-33%) Fat Layer (Subcutaneous Tissue) Exposed: Yes Tendon Exposed: No Muscle Exposed: No Joint Exposed: No Bone Exposed: No Periwound Skin Texture Texture Color No Abnormalities Noted: Yes No Abnormalities Noted: No Hemosiderin Staining: Yes Moisture No Abnormalities Noted: Yes Temperature / Pain Temperature: No Abnormality Tenderness on Palpation: Yes Treatment Notes Wound #2 (Upper Leg) Wound Laterality: Right, Medial Cleanser Peri-Wound Care Topical Primary Dressing Secondary Dressing Secured With Compression Wrap Compression Stockings Add-Ons Electronic Signature(s) Signed: 11/29/2022 7:49:05 AM By: Cheryl [name] Entered By: Cheryl [name] on 11/02/2022 13:37:49 Cheryl [name] ([id number]) [id number] Page 8 of 9 -------------------------------------------------------------------------------- Wound Assessment Details Patient Name: Date of Service: Cheryl [name], Cheryl [name]. 11/02/2022 1:15 PM Medical Record Number: [id number] Patient Account Number: [id number] Date of Birth/Sex: Treating RN: [birth year] (77 y.o. Cheryl [name] Primary Care Jeyla Bulger: Cheryl [name] Other Clinician: Referring Chrisandra Wiemers: Treating Desmin Daleo/Extender: Cheryl [name] in Treatment: 13 Wound Status Wound Number: 4 Primary Inflammatory Etiology: Wound Location: Left, Medial Upper Leg Wound Healed - Epithelialized Wounding Event:  Blister Status: Date Acquired: 09/22/2022 Comorbid Cataracts, Chronic sinus problems/congestion, Asthma, Weeks Of Treatment: 5 History: Hypertension, Osteoarthritis Clustered Wound: No Photos Wound Measurements Length: (cm) Width: (cm) Depth: (cm) Area: (cm) Volume: (cm) 0 % Reduction in Area: 100% 0 % Reduction in Volume: 100% 0 0 0 Wound Description Classification: Full Thickness Without Exposed Support Structures Exudate Amount: Medium Exudate Type: Serous Exudate Color: amber Foul Odor After Cleansing: No Slough/Fibrino No Wound Bed Granulation Amount: Large (67-100%) Exposed Structure Granulation Quality: Pink Fascia Exposed: No Necrotic Amount: None Present (0%) Fat Layer (Subcutaneous Tissue) Exposed: No Tendon Exposed: No Muscle Exposed: No Joint Exposed: No Bone Exposed: No Periwound Skin Texture Texture Color No Abnormalities Noted: Yes No Abnormalities Noted: Yes Moisture Temperature / Pain No Abnormalities Noted: Yes Temperature: No Abnormality Treatment Notes Wound #4 (Upper Leg) Wound Laterality: Left, Medial Cleanser Peri-Wound Care Topical Primary Dressing Secondary Dressing Secured With [employer], BENTANCOURT ([id number]) [phone] Page 9 of 9 Compression Wrap Compression Stockings Add-Ons Electronic Signature(s) Signed: 11/29/2022 7:49:05 AM By: Cheryl [name] Entered By: Cheryl [name] on 11/02/2022 13:37:49 -------------------------------------------------------------------------------- Vitals Details Patient Name: Date of Service: Cheryl Mayo, Cheryl NNA [name]. 11/02/2022 1:15 PM Medical Record Number: [id number] Patient Account Number: [id number] Date of Birth/Sex: Treating RN: Apr 30, 1946 (77 y.o. F) Primary Care Camerin Ladouceur: Cheryl [name] Other Clinician: Referring Ferne Ellingwood: Treating Braxen Dobek/Extender: Cheryl [name] in Treatment: 13 Vital Signs Time Taken: 01:13 Temperature (F): 97.9 Height (in):  60 Pulse (bpm): 55 Weight (lbs): 116.5 Respiratory Rate (breaths/min): 20 Body Mass Index (BMI): 22.7 Blood Pressure (mmHg): 139/57 Reference Range: 80 - 120 mg / dl Electronic Signature(s) Signed: 11/02/2022 4:09:24 PM By: Cheryl [name] on 11/02/2022 13:14:02

## 2022-12-19 NOTE — Progress Notes (Signed)
Cheryl [name] ([id number]) [id number] Page 1 of 7 Visit Report for 11/02/2022 Chief Complaint Document Details Patient Name: Date of Service: Cheryl [name], Cheryl [name] [place] [name]. 11/02/2022 1:15 PM Medical Record Number: [id number] Patient Account Number: [id number] Date of Birth/Sex: Treating RN: July 26, 1945 (77 y.o. F) Primary Care Provider: Georgette [name] Other Clinician: Referring Provider: Treating Provider/Extender: Cheryl [name] in Treatment: 13 Information Obtained from: Patient Chief Complaint Patient seen for complaints of Non-Healing Wound. Electronic Signature(s) Signed: 11/02/2022 2:07:43 PM By: Cheryl [name] Entered By: Cheryl [name] on 11/02/2022 14:07:43 -------------------------------------------------------------------------------- HPI Details Patient Name: Date of Service: Cheryl Mayo, DO NNA [name]. 11/02/2022 1:15 PM Medical Record Number: [id number] Patient Account Number: [id number] Date of Birth/Sex: Treating RN: [birth year] (78 y.o. F) Primary Care Provider: Georgette [name] Other Clinician: Referring Provider: Treating Provider/Extender: Cheryl [name] in Treatment: 13 History of Present Illness HPI Description: ADMISSION 07/29/2022 This is a 77 year old woman with a past medical history notable for hypertension, hypothyroidism, and dementia. She was referred by her primary care doctor for evaluation of wounds on her bilateral legs. The family practice notes characterizes these as venous stasis ulcers, but these are actually superficial wounds on the patient's medial bilateral thighs. They appear to start as blisters and the wounds are limited to breakdown of skin. She has an intact blister on her right medial thigh. She does not have any lower leg wounds. She and her husband deny any skin picking habits. These do not appear to be consistent with urine burns from incontinence. They deny any new soaps,  detergents, or clothing that could be irritating the area. They have been applying triamcinolone and mupirocin to the wounds. She is currently taking doxycycline, although I do not see any evidence of infection. 08/12/2022: She developed a large blister on her left medial calf. This has broken and she has a new wound in this site. She has a couple of other small unbroken blisters on this leg. The other sites, that were present last visit, are smaller. All sites are clean without concern for infection or any accumulation of slough. 3/14; patient presents for follow-up. It appears that her previous blister sites have healed however she has developed new wounds to the inner left thigh and medial right thigh/leg. She came in with no dressings on. 09/13/2022: The old wounds have healed but she has a new wound on her right lower leg, just below the [name]. There is slough on the surface. She also has a blister distal to this. 09/27/2022: Her right leg has healed. She has a new blister on her left medial thigh. No concern for infection. She came in again without any dressings on; her husband says she picks them off. 11/02/2022: She returns today for a follow-up check. She has no open wounds, nor does she have any blisters. Her husband says that he has not noticed any for some time. Electronic Signature(s) Signed: 11/02/2022 2:08:18 PM By: Cheryl [name] Entered By: Cheryl [name] on 11/02/2022 14:08:18 Cheryl [name] ([id number]) [id number] Page 2 of 7 -------------------------------------------------------------------------------- Physical Exam Details Patient Name: Date of Service: Cheryl [name] [place] [name]. 11/02/2022 1:15 PM Medical Record Number: [id number] Patient Account Number: [id number] Date of Birth/Sex: Treating RN: 1946-05-04 (77 y.o. F) Primary Care Provider: Georgette [name] Other Clinician: Referring Provider: Treating Provider/Extender: Cheryl [name] in Treatment: 13 Constitutional . Slightly bradycardic. . . no acute distress. Respiratory Normal work of breathing on room air. Notes 11/02/2022: No open wounds or  blisters. Electronic Signature(s) Signed: 11/02/2022 2:09:03 PM By: Cheryl [name] Entered By: Cheryl [name] on 11/02/2022 14:09:03 -------------------------------------------------------------------------------- Physician Orders Details Patient Name: Date of Service: Cheryl [name] NNA [name]. 11/02/2022 1:15 PM Medical Record Number: [id number] Patient Account Number: [id number] Date of Birth/Sex: Treating RN: Nov 02, 1945 (77 y.o. 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 Congratulations!! Bathing/ Shower/ Hygiene May shower and wash wound with soap and water. Electronic Signature(s) Signed: 11/02/2022 2:09:17 PM By: Cheryl [name] Entered By: Cheryl [name] on 11/02/2022 14:09:17 Cheryl [name] ([id number]) [id number] Page 3 of 7 -------------------------------------------------------------------------------- Problem List Details Patient Name: Date of Service: Cheryl [name] [place] [name]. 11/02/2022 1:15 PM Medical Record Number: [id number] Patient Account Number: [id number] Date of Birth/Sex: Treating RN: 01/20/1946 (77 y.o. 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 09/27/2022 No Yes I10 Essential (primary) hypertension 07/29/2022 No Yes I27.20 Pulmonary hypertension, unspecified 07/29/2022 No Yes Inactive Problems ICD-10 Code Description Active Date Inactive Date L97.811 Non-pressure chronic ulcer of other part of right lower leg limited to breakdown of skin 09/13/2022 09/13/2022 Resolved Problems ICD-10 Code Description Active Date Resolved Date L97.111 Non-pressure chronic ulcer of right thigh limited to breakdown of skin 07/29/2022 07/29/2022 L97.221 Non-pressure chronic ulcer of left calf limited to breakdown of skin 08/12/2022 08/12/2022 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 11/02/2022 1:15 PM Medical Record Number: [id number] Patient Account Number: [id number] Date of Birth/Sex: Treating RN: [birth year] (77 y.o. F) Primary Care Provider: Georgette [name] Other Clinician: Rachael [name] ([id number]) [id number] Page 4 of 7 Referring Provider: Treating Provider/Extender: Cheryl [name] in Treatment: 13 Subjective Chief Complaint Information obtained from Patient Patient seen for complaints of Non-Healing Wound. History of Present Illness (HPI) ADMISSION 07/29/2022 This is a 77 year old woman with a past medical history notable for hypertension, hypothyroidism, and dementia. She was referred by her primary care doctor for evaluation of wounds on her bilateral legs. The family practice notes characterizes these as venous  stasis ulcers, but these are actually superficial wounds on the patient's medial bilateral thighs. They appear to start as blisters and the wounds are limited to breakdown of skin. She has an intact blister on her right medial thigh. She does not have any lower leg wounds. She and her husband deny any skin picking habits. These do not appear to be consistent with urine burns from incontinence. They deny any new soaps, detergents, or clothing that could be irritating the area. They have been applying triamcinolone and mupirocin to the wounds. She is currently taking doxycycline, although I do not see any evidence of infection. 08/12/2022: She developed a large blister on her left medial calf. This has broken and she has a new wound in  this site. She has a couple of other small unbroken blisters on this leg. The other sites, that were present last visit, are smaller. All sites are clean without concern for infection or any accumulation of slough. 3/14; patient presents for follow-up. It appears that her previous blister sites have healed however she has developed new wounds to the inner left thigh and medial right thigh/leg. She came in with no dressings on. 09/13/2022: The old wounds have healed but she has a new wound on her right lower leg, just below the [name]. There is slough on the surface. She also has a blister distal to this. 09/27/2022: Her right leg has healed. She has a new blister on her left medial thigh. No concern for infection. She came in again without any dressings on; her husband says she picks them off. 11/02/2022: She returns today for a follow-up check. She has no open wounds, nor does she have any blisters. Her husband says that he has not noticed any for some time. Patient History Information obtained from Patient, Caregiver, Chart. Family History Heart Disease, Hypertension - Mother, No family history of Cancer, Diabetes, Hereditary Spherocytosis, Kidney Disease, Lung Disease,  Seizures, Stroke, Thyroid Problems, Tuberculosis. Social History Never smoker, Marital Status - Married, Alcohol Use - Never, Drug Use - No History, Caffeine Use - Daily. Medical History Eyes Patient has history of Cataracts - bil removed Denies history of Glaucoma, Optic Neuritis Ear/Nose/Mouth/Throat Patient has history of Chronic sinus problems/congestion Denies history of Middle ear problems Respiratory Patient has history of Asthma Cardiovascular Patient has history of Hypertension Endocrine Denies history of Type I Diabetes, Type II Diabetes Genitourinary Denies history of End Stage Renal Disease Immunological Denies history of Lupus Erythematosus, Raynauds, Scleroderma Integumentary (Skin) Denies history of History of Burn Musculoskeletal Patient has history of Osteoarthritis Denies history of Gout, Rheumatoid Arthritis Oncologic Denies history of Received Chemotherapy, Received Radiation Psychiatric Denies history of Anorexia/bulimia, Confinement Anxiety Medical A Surgical History Notes nd Ear/Nose/Mouth/Throat hard of hearing Cardiovascular mitral valve disorder, pulmonary hypertension Endocrine hypothyroidism Musculoskeletal osteoporesis Cheryl [name], Cheryl [name] ([id number]) [id number] Page 5 of 7 Objective Constitutional Slightly bradycardic. no acute distress. Vitals Time Taken: 1:13 AM, Height: 60 in, Weight: 116.5 lbs, BMI: 22.7, Temperature: 97.9 F, Pulse: 55 bpm, Respiratory Rate: 20 breaths/min, Blood Pressure: 139/57 mmHg. Respiratory Normal work of breathing on room air. General Notes: 11/02/2022: No open wounds or blisters. Integumentary (Hair, Skin) Wound #2 status is Healed - Epithelialized. Original cause of wound was Blister. The date acquired was: 07/25/2022. The wound has been in treatment 13 weeks. The wound is located on the Right,Medial Upper Leg. The wound measures 0cm length x 0cm width x 0cm depth; 0cm^2 area and 0cm^3  volume. There is Fat Layer (Subcutaneous Tissue) exposed. There is a medium amount of serous drainage noted. The wound margin is flat and intact. There is large (67-100%) pink granulation within the wound bed. There is a small (1-33%) amount of necrotic tissue within the wound bed. The periwound skin appearance had no abnormalities noted for texture. The periwound skin appearance had no abnormalities noted for moisture. The periwound skin appearance exhibited: Hemosiderin Staining. Periwound temperature was noted as No Abnormality. The periwound has tenderness on palpation. Wound #4 status is Healed - Epithelialized. Original cause of wound was Blister. The date acquired was: 09/22/2022. The wound has been in treatment 5 weeks. The wound is located on the Left,Medial Upper Leg. The wound measures 0cm length x 0cm width x 0cm depth; 0cm^2 area  and 0cm^3 volume. There is a medium amount of serous drainage noted. There is large (67-100%) pink granulation within the wound bed. There is no necrotic tissue within the wound bed. The periwound skin appearance had no abnormalities noted for texture. The periwound skin appearance had no abnormalities noted for moisture. The periwound skin appearance had no abnormalities noted for color. Periwound temperature was noted as No Abnormality. 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 Congratulations!! Bathing/ Shower/ Hygiene: May shower and wash wound with soap and water. 11/02/2022: She has no open wounds or blisters today. Her husband says that he has not noticed any for some time. Although I am still not sure what the reason for her blistering and superficial wound formation has been, it seems to have resolved. She will be discharged from  the wound care center. She may follow-up as needed. Electronic Signature(s) Signed: 12/19/2022 2:18:55 PM By: Pearletha Alfred Signed: 12/19/2022 2:55:18 PM By: Cheryl [name] Previous Signature: 11/02/2022 2:10:07 PM Version By: Cheryl [name] Entered By: Pearletha Alfred on 12/19/2022 14:18:54 -------------------------------------------------------------------------------- HxROS Details Patient Name: Date of Service: Cheryl Mayo, DO NNA [name]. 11/02/2022 1:15 PM Medical Record Number: [id number] Patient Account Number: [id number] Date of Birth/Sex: Treating RN: 1945-11-25 (77 y.o. F) Primary Care Provider: Georgette [name] Other Clinician: Rachael [name] ([id number]) [id number] Page 6 of 7 Referring Provider: Treating Provider/Extender: Cheryl [name] in Treatment: 13 Information Obtained From Patient Caregiver Chart Eyes Medical History: Positive for: Cataracts - bil removed Negative for: Glaucoma; Optic Neuritis Ear/Nose/Mouth/Throat Medical History: Positive for: Chronic sinus problems/congestion Negative for: Middle ear problems Past Medical History Notes: hard of hearing Respiratory Medical History: Positive for: Asthma Cardiovascular Medical History: Positive for: Hypertension Past Medical History Notes: mitral valve disorder, pulmonary hypertension Endocrine Medical History: Negative for: Type I Diabetes; Type II Diabetes Past Medical History Notes: hypothyroidism Genitourinary Medical History: Negative for: End Stage Renal Disease Immunological Medical History: Negative for: Lupus Erythematosus; Raynauds; Scleroderma Integumentary (Skin) Medical History: Negative for: History of Burn Musculoskeletal Medical History: Positive for: Osteoarthritis Negative for: Gout; Rheumatoid Arthritis Past Medical History Notes: osteoporesis Oncologic Medical History: Negative for: Received Chemotherapy; Received  Radiation Psychiatric Medical History: Negative for: Anorexia/bulimia; Confinement Anxiety HBO Extended History Items Ear/Nose/Mouth/Throat: Eyes: Chronic sinus Cataracts problems/congestion Immunizations EKATERINI, LACE ([id number]) [id number] Page 7 of 7 Pneumococcal Vaccine: Received Pneumococcal Vaccination: Yes Received Pneumococcal Vaccination On or After 60th Birthday: No Implantable Devices No devices added Family and Social History Cancer: No; Diabetes: No; Heart Disease: Yes; Hereditary Spherocytosis: No; Hypertension: Yes - Mother; Kidney Disease: No; Lung Disease: No; Seizures: No; Stroke: No; Thyroid Problems: No; Tuberculosis: No; Never smoker; Marital Status - Married; Alcohol Use: Never; Drug Use: No History; Caffeine Use: Daily; Financial Concerns: No; Food, Clothing or Shelter Needs: No; Support System Lacking: No; Transportation Concerns: No Electronic Signature(s) Signed: 11/02/2022 4:09:43 PM By: Cheryl [name] Entered By: Cheryl [name] on 11/02/2022 14:08:26 -------------------------------------------------------------------------------- SuperBill Details Patient Name: Date of Service: Cheryl Mayo, DO NNA [name]. 11/02/2022 Medical Record Number: [id number] Patient Account Number: [id number] Date of Birth/Sex: Treating RN: August 16, 1945 (77 y.o. Cheryl [name] Primary Care Provider: Georgette [name] Other Clinician: Referring Provider: Treating Provider/Extender: Cheryl [name] in Treatment: 13 Diagnosis Coding ICD-10 Codes Code Description [id number] Non-pressure chronic ulcer of left thigh limited  to breakdown of skin I10 Essential (primary) hypertension I27.20 Pulmonary hypertension, unspecified Facility Procedures : CPT4 Code: [id number] Description: 30865 - WOUND CARE VISIT-LEV 2 EST PT Modifier: 25 Quantity: 1 Physician Procedures : CPT4 Code Description Modifier [id number] - WC PHYS LEVEL 3 - EST  PT ICD-10 Diagnosis Description 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
